# Patient Record
Sex: Female | Born: 1950 | Race: Black or African American | Hispanic: No | Marital: Single | Smoking: Former smoker
Health system: Southern US, Community
[De-identification: ages and names within clinical notes are randomized; demographics above are authoritative.]

## PROBLEM LIST (undated history)

## (undated) DIAGNOSIS — M1990 Unspecified osteoarthritis, unspecified site: Secondary | ICD-10-CM

## (undated) DIAGNOSIS — F32A Depression, unspecified: Secondary | ICD-10-CM

## (undated) DIAGNOSIS — M48061 Spinal stenosis, lumbar region without neurogenic claudication: Secondary | ICD-10-CM

## (undated) DIAGNOSIS — E785 Hyperlipidemia, unspecified: Secondary | ICD-10-CM

## (undated) DIAGNOSIS — H269 Unspecified cataract: Secondary | ICD-10-CM

## (undated) DIAGNOSIS — F419 Anxiety disorder, unspecified: Secondary | ICD-10-CM

## (undated) DIAGNOSIS — C801 Malignant (primary) neoplasm, unspecified: Secondary | ICD-10-CM

## (undated) DIAGNOSIS — I10 Essential (primary) hypertension: Secondary | ICD-10-CM

## (undated) DIAGNOSIS — F329 Major depressive disorder, single episode, unspecified: Secondary | ICD-10-CM

## (undated) DIAGNOSIS — M549 Dorsalgia, unspecified: Secondary | ICD-10-CM

## (undated) DIAGNOSIS — K219 Gastro-esophageal reflux disease without esophagitis: Secondary | ICD-10-CM

## (undated) HISTORY — PX: TRANSTHORACIC ECHOCARDIOGRAM: SHX275

## (undated) HISTORY — DX: Gastro-esophageal reflux disease without esophagitis: K21.9

## (undated) HISTORY — DX: Hyperlipidemia, unspecified: E78.5

## (undated) HISTORY — DX: Spinal stenosis, lumbar region without neurogenic claudication: M48.061

## (undated) HISTORY — PX: HAND SURGERY: SHX662

## (undated) HISTORY — DX: Major depressive disorder, single episode, unspecified: F32.9

## (undated) HISTORY — DX: Unspecified cataract: H26.9

## (undated) HISTORY — DX: Essential (primary) hypertension: I10

## (undated) HISTORY — DX: Dorsalgia, unspecified: M54.9

## (undated) HISTORY — PX: CHOLECYSTECTOMY: SHX55

## (undated) HISTORY — DX: Unspecified osteoarthritis, unspecified site: M19.90

## (undated) HISTORY — DX: Depression, unspecified: F32.A

---

## 1981-04-02 HISTORY — PX: VAGINAL HYSTERECTOMY: SUR661

## 2002-01-20 ENCOUNTER — Inpatient Hospital Stay (HOSPITAL_COMMUNITY): Admission: EM | Admit: 2002-01-20 | Discharge: 2002-01-22 | Payer: Self-pay | Admitting: Emergency Medicine

## 2002-01-20 ENCOUNTER — Encounter: Payer: Self-pay | Admitting: Emergency Medicine

## 2002-01-22 ENCOUNTER — Encounter: Payer: Self-pay | Admitting: Cardiology

## 2005-03-22 ENCOUNTER — Encounter: Admission: RE | Admit: 2005-03-22 | Discharge: 2005-03-22 | Payer: Self-pay | Admitting: Rheumatology

## 2005-10-04 ENCOUNTER — Encounter: Admission: RE | Admit: 2005-10-04 | Discharge: 2006-01-02 | Payer: Self-pay | Admitting: Internal Medicine

## 2006-11-14 ENCOUNTER — Encounter: Admission: RE | Admit: 2006-11-14 | Discharge: 2006-11-14 | Payer: Self-pay | Admitting: Family Medicine

## 2006-11-28 ENCOUNTER — Encounter: Admission: RE | Admit: 2006-11-28 | Discharge: 2006-11-28 | Payer: Self-pay | Admitting: Family Medicine

## 2006-11-30 ENCOUNTER — Encounter: Admission: RE | Admit: 2006-11-30 | Discharge: 2006-11-30 | Payer: Self-pay | Admitting: Family Medicine

## 2006-12-11 ENCOUNTER — Ambulatory Visit: Payer: Self-pay | Admitting: Internal Medicine

## 2006-12-11 LAB — CONVERTED CEMR LAB
Basophils Relative: 0.7 % (ref 0.0–1.0)
CO2: 28 meq/L (ref 19–32)
Calcium: 9.2 mg/dL (ref 8.4–10.5)
Chloride: 103 meq/L (ref 96–112)
Eosinophils Relative: 1.4 % (ref 0.0–5.0)
GFR calc Af Amer: 111 mL/min
Glucose, Bld: 86 mg/dL (ref 70–99)
HCT: 37.1 % (ref 36.0–46.0)
Lymphocytes Relative: 42.5 % (ref 12.0–46.0)
Neutro Abs: 3.1 10*3/uL (ref 1.4–7.7)
Neutrophils Relative %: 47.5 % (ref 43.0–77.0)
Platelets: 387 10*3/uL (ref 150–400)
Sodium: 139 meq/L (ref 135–145)
WBC: 6.5 10*3/uL (ref 4.5–10.5)

## 2007-01-15 ENCOUNTER — Ambulatory Visit: Payer: Self-pay | Admitting: Internal Medicine

## 2007-02-04 ENCOUNTER — Encounter: Admission: RE | Admit: 2007-02-04 | Discharge: 2007-02-04 | Payer: Self-pay | Admitting: Neurology

## 2007-02-17 ENCOUNTER — Encounter: Admission: RE | Admit: 2007-02-17 | Discharge: 2007-02-17 | Payer: Self-pay | Admitting: Neurology

## 2007-03-13 ENCOUNTER — Encounter: Admission: RE | Admit: 2007-03-13 | Discharge: 2007-03-13 | Payer: Self-pay | Admitting: Neurology

## 2008-06-25 ENCOUNTER — Encounter: Admission: RE | Admit: 2008-06-25 | Discharge: 2008-06-25 | Payer: Self-pay | Admitting: Family Medicine

## 2010-08-15 NOTE — Assessment & Plan Note (Signed)
HEALTHCARE                             PULMONARY OFFICE NOTE   JUN, RIGHTMYER                   MRN:          [id number]  DATE:01/15/2007                            DOB:          [birth year]    HISTORY:  A 60 year old black female, former smoker with new onset cough  3 months prior to her evaluation on September 10th with apparent  eventration of the right hemidiaphragm, and possible interstitial lung  disease associated with that I thought was symptoms suggestive of  GERD/LPR on a background of possible rheumatoid arthritis.   I initially approached the problem by recommending a strict diet for  GERD, and asking her to take omeprazole perfectly regularly, 40 mg  before breakfast.  She returns today all smiles with complete  elimination of cough.  She has no significant limiting dyspnea at this  point (she is limited by her back and hips though).   PHYSICAL EXAMINATION:  She is a pleasant ambulatory black female in no  acute distress.  She had stable vital signs.  HEENT:  Unremarkable.  Oropharynx clear.  LUNG FIELDS:  Reveal minimal crackles in the bases.  There is no cough  on inspiratory maneuvers.  HEART:  Regular rhythm without murmur, gallop, or rub.  No increase in  P2.  ABDOMEN:  Soft and benign.  EXTREMITIES:  Warm without calf tenderness, cyanosis, clubbing, or  edema.   Spirometry reveals borderline low lung volumes with a diffusing capacity  of 59% that corrects to 126.   LABORATORY DATA:  From September 10th indicated sed rate of 38.  No  significant eosinophils or evidence of elevated BNP.   IMPRESSION:  Multifactorial dyspnea that I believe is probably related  to very mild interstitial lung disease, and poorly functioning right  hemidiaphragm.  The best she can do in this setting is not gain weight  in the abdominal compartment.  Also, we need to make sure if she does in  fact have any form of rheumatoid arthritis,  that the disease is well  controlled systemically because of the possibility of rheumatoid lung  disease.  I have not actually confirmed she has rheumatoid arthritis  yet, much less rheumatoid lung disease, however.  Almost all of her  symptoms are resolved with proton pump inhibitor.  Acid reflux could be  a unifying diagnosis, as it could cause fibrosis as well, and therefore,  I have asked her to maintain the diet and continue the omeprazole  indefinitely.  Based on how dramatic her response was to omeprazole, I  would also consider eliminating Norvasc from her regimen, and  substituting ARB if possible, such as Diovan or Benicar since calcium  channel blockers interfere with gastroesophageal valve functioning, and  may result in non-acid reflux in a patient maintained on omeprazole.  I  will defer  this issue to Dr. Clide Deutscher, having confirmed that the patient has  eliminated all of her pulmonary symptoms on her present regimen, and  bring this up as a future concern only.     Charlaine Dalton. Sherene Sires, MD, [facility]  Electronically Signed  MBW/MedQ  DD: 01/15/2007  DT: 01/16/2007  Job #: [id number]   cc:   Clyda Greener, MD  Aundra Dubin, M.D.

## 2010-08-15 NOTE — Assessment & Plan Note (Signed)
[facility]                             PULMONARY OFFICE NOTE   Morgan Swanson, Morgan Swanson                   MRN:          [id number]  DATE:12/11/2006                            DOB:          28-Jan-1951    HISTORY:  A 60 year old black female, former smoker, with new onset  cough about 3 months ago with generalized discomfort during deep  breathing and dyspnea with exertion but not at rest.  She has had no  real progression of her symptoms but comes in today because of an  abnormal chest x-ray indicating a possible interstitial lung disease.  She has already been treated empirically for pneumonia and also with  prednisone since the onset of her symptoms and feels some better.  She  still feels uncomfortable when she tries to take a deep breath but only  over the center.  She denies any significant variability of her symptoms  with weather or environmental change or nocturnal wheeze, fevers,  chills, sweats, chest pain, or lateralizing pleuritic pain, orthopnea,  PND, or leg swelling.  No unintended weight loss or difficulty with  swallowing or sinus tenderness.   PAST MEDICAL HISTORY:  Significant for:  1. Hypertension.  2. She does have a history of rheumatology disease per Dr. Kellie Simmering,      does not have a specific diagnosis, however, and has not been on      any form of immunotherapy.  3. She is status post cholecystectomy.  4. Hysterectomy.   ALLERGIES:  1. PENICILLIN.  2. CODEINE.   MEDICATIONS:  Crestor, furosemide, amlodipine.   No history of any Macrodantin or amiodarone exposure.   SOCIAL HISTORY:  She quit smoking 7 years ago.  She works as a Financial risk analyst.   FAMILY HISTORY:  Negative for respiratory diseases or rheumatism.   REVIEW OF SYSTEMS:  Taken in detail on the worksheet negative except as  outlined above.   PHYSICAL EXAMINATION:  GENERAL:  A pleasant ambulatory black female in  no acute distress.  VITAL SIGNS:  She is afebrile  with normal vital signs.  HEENT:  Unremarkable.  Pharynx is clear.  Dentition is intact.  Nasal  turbinate normal.  Ear canals clear bilaterally.  NECK:  Supple without cervical adenopathy or tenderness.  Trachea is  midline.  No thyromegaly.  LUNGS:  Fields reveal minimal coarsening of breath sounds with no  typical crackles.  HEART:  Regular rhythm without murmur, gallop, or rub.  ABDOMEN:  Soft, benign.  EXTREMITIES:  Warm without calf tenderness, cyanosis, clubbing.   Chest x-ray shows slight elevation of right hemidiaphragm with mild  increased interstitial markings.   IMPRESSION:  Possible interstitial lung disease associated with probable  long-standing eventration of the right hemidiaphragm.  Some of her  symptoms sound suggestive of gastroesophageal reflux disease, which is  statistically associated with pulmonary fibrosis, although not  necessarily in this case by cause and effect.   I did recommend empirically starting her on omeprazole 40 mg 30 minutes  before supper because most of her symptoms occur worse in the evening  but would also recommend a BNP, CBC  with differential looking for  eosinophilia, sed rate today, and followup set of PFTs.   I discussed with her briefly the differential diagnosis of pulmonary  fibrosis, though I do not necessarily think she fits the typical pattern  of idiopathic pulmonary fibrosis or for that matter any progressive  pattern.  I am most intrigued by the fact that she has a history of  possible rheumatoid arthritis and that this may represent a pulmonary  manifestation and will also send a copy of my thoughts to Dr. Kellie Simmering.     Morgan Swanson. Morgan Sires, MD, [facility]  Electronically Signed    MBW/MedQ  DD: 12/11/2006  DT: 12/12/2006  Job #: [id number]   cc:   Clyda Greener, MD  Aundra Dubin, M.D.

## 2010-08-18 NOTE — Discharge Summary (Signed)
NAME:  Morgan Swanson, Morgan Swanson                      ACCOUNT NO.:  [id number]   MEDICAL RECORD NO.:  [id number]                   PATIENT TYPE:  INP   LOCATION:  3731                                 FACILITY:  [facility]   PHYSICIAN:  Madaline Guthrie, MD                   DATE OF BIRTH:  Jan 29, 1951   DATE OF ADMISSION:  01/20/2002  DATE OF DISCHARGE:  01/22/2002                                 DISCHARGE SUMMARY   DISCHARGE DIAGNOSES:  1. Chest pressure.  2. Systolic murmur.  3. Hypertension.  4. Tobacco abuse.  5. Anxiety.   DISCHARGE MEDICATIONS:  1. Lisinopril 5 mg one p.o. q.d.  2. Hydroxyzine 50 mg one to two p.o. q.6h. p.r.n. anxiety.   DISCHARGE INSTRUCTIONS:  She is to return to work on Monday. If she  continues to have any chest pain with nausea, diaphoresis, increased  shortness of breath, she is to call her primary M.D. or return to the  hospital. She is also to take the hydroxyzine for anxiety and also warned  her not to drive after she takes the hydroxyzine as it may make her tired.   FOLLOW UP:  Followup appointment has been made with Dr. Lonzo Cloud on  10 a.m. on Wednesday, 10/28. At this time, she will need to have a basic  metabolic panel drawn.   HISTORY OF PRESENT ILLNESS:  The patient is a 60 year old African-American  female who presented to the emergency department via EMS for chest pressure  that began the morning of admission while at work. She was ambulating at the  time. The pressure lasted less than 30 minutes and relieved from a 7/10 to a  4/10 with aspirin and then again to a 0/10 after nitroglycerin given by the  EMS. Pressure was located in the mid sternal area, left-sided, with  radiation to the lower back and left shoulder and neck. There were  palpitations associated with this chest pressure. She had no associated  nausea, vomiting, diaphoresis, and no syncope. She did have some shortness  of breath which was relieved by rest. She had prior episodes  of chest  pressures and palpitations for about one week that were occurring at  increased frequency and definitely with increased severity on day of  admission. She had no significant cardiac history. Her past medical history  was significant for hypertension, post menopausal, and heart murmur. She had  been noncompliant with her lisinopril and hydrochlorothiazide and had been  followed closely by Dr. Henreitta Leber at [facility]. Her cardiac risk factors  include being a female who is post menopausal, tobacco use, and  hypertension.   PHYSICAL EXAMINATION:  VITAL SIGNS:  On admission showed stable vital signs  with a blood pressure of 151/91, O2 saturation 98% on room air.  GENERAL:  She appeared to be alert and oriented x3 in no acute distress.  LUNGS:  Significant things on her physical exam include  her lung exam clear  to auscultation bilaterally without wheezing, rales, or rhonchi and no  increased work of breathing.  CARDIOVASCULAR:  She was regular, rate, and rhythm with a +2/6 systolic  murmur best heard at the right upper sternal border.  ABDOMEN:  Soft and nontender with good bowel sounds. No rebound, guarding,  or rigidity.  EXTREMITIES:  Showed no edema, clubbing, or cyanosis.  MUSCULOSKELETAL EXAM:  Showed a +5/5 grip strength.  NEUROLOGICAL:  She was grossly intact with no focal deficits.   LABORATORY DATA:  Initial laboratory work done on admission showed a normal  CBC, a normal BMP. Cardiac enzymes were obtained in three sets, all which  were negative. The patient did not require any more medication for chest  pain and, when seen in the emergency department, had a post nitroglycerin  headache.   HOSPITAL COURSE:  1. For her chest pressure and dyspnea, we ruled out any cardiac causes by     doing serial EKGs, showing normal sinus rhythm with no ectopy and no ST-     segment changes or T-wave changes. She was placed on the telemetry unit     and remained in normal sinus  rhythm. Her cardiac enzymes were negative     x3. She was placed on 81 mg of aspirin per day, nitroglycerin p.r.n.     which she did require any further doses. A chest x-ray was done in the ED     showing cardiomegaly. We ruled out any thyroid dysfunction with a TSH at     1.032. An ABG was done in the emergency room to rule out PE as source for     her chest pressure and dyspnea. Her blood gas revealed a pH of 7.374, a     pCO2 of 45.1, and a bicarb of 26.0. We ruled out other cardiac risk     factors with a fasting lipid panel. Her total cholesterol was 233, her     triglycerides were 90, HDL 85, and LDL was 130. An echocardiogram was     performed to rule out any LV dysfunction, and final results are still     pending.  2. For her systolic murmur which patient noted she had a history of in the     past; on physical exam, her 2/6 systolic murmur increased in loudness     with standing and Valsalva murmur and decreased when supine. The     echocardiogram was done to rule out any hypertrophic cardiomyopathy     associated with her systolic murmur. Results are pending.  3. For her hypertension, the patient was placed on Prinivil 5 mg q.d. Blood     pressure responded appropriately.  4. For her palpitations, the patient was placed on telemetry and had no     arrhythmias during her stay. She did note some anxiety and a lot of     stress in her life. At discharge, we are sending her home with some     hydroxyzine p.r.n. as an outpatient trial.   PENDING RESULTS:  Pending results include her echocardiogram results done on  day of discharge.   FOLLOWUP ITEMS:  Suggested followup items include a basic metabolic panel  which should be drawn at her followup appointment as she is placed back on  her ACE inhibitor. It was noted that the patient had stopped taking her ACE  inhibitor, and there is an unknown length of time in between when she stopped taking and  when she restarted it. Please check on  her potassium and  her creatinine. Also followup on her hydroxyzine as it has just been started  for her anxiety. She may need more long term anxiety medications such as a  SSRI. Also followup on her high blood pressure as she originally had been  started on two antihypertensives by Dr. Henreitta Leber, lisinopril and  hydrochlorothiazide. Currently, she is only on 5 mg of lisinopril. She may  in the future require additional antihypertensive medications.     Lorne Skeens, M.D.                         Madaline Guthrie, MD    KL/MEDQ  D:  01/22/2002  T:  01/23/2002  Job:  [id number]   cc:   Secundino Ginger, M.D.  [facility]

## 2010-10-02 IMAGING — CR DG CHEST 2V
2 series · 2 of 2 positions shown · non-contrast
Comparison: Chest x-ray of 12/11/2006

CLINICAL DATA: Hypertension, history of arthritis, former smoker

CHEST - 2 VIEW

[view not recorded (1 of 2)]
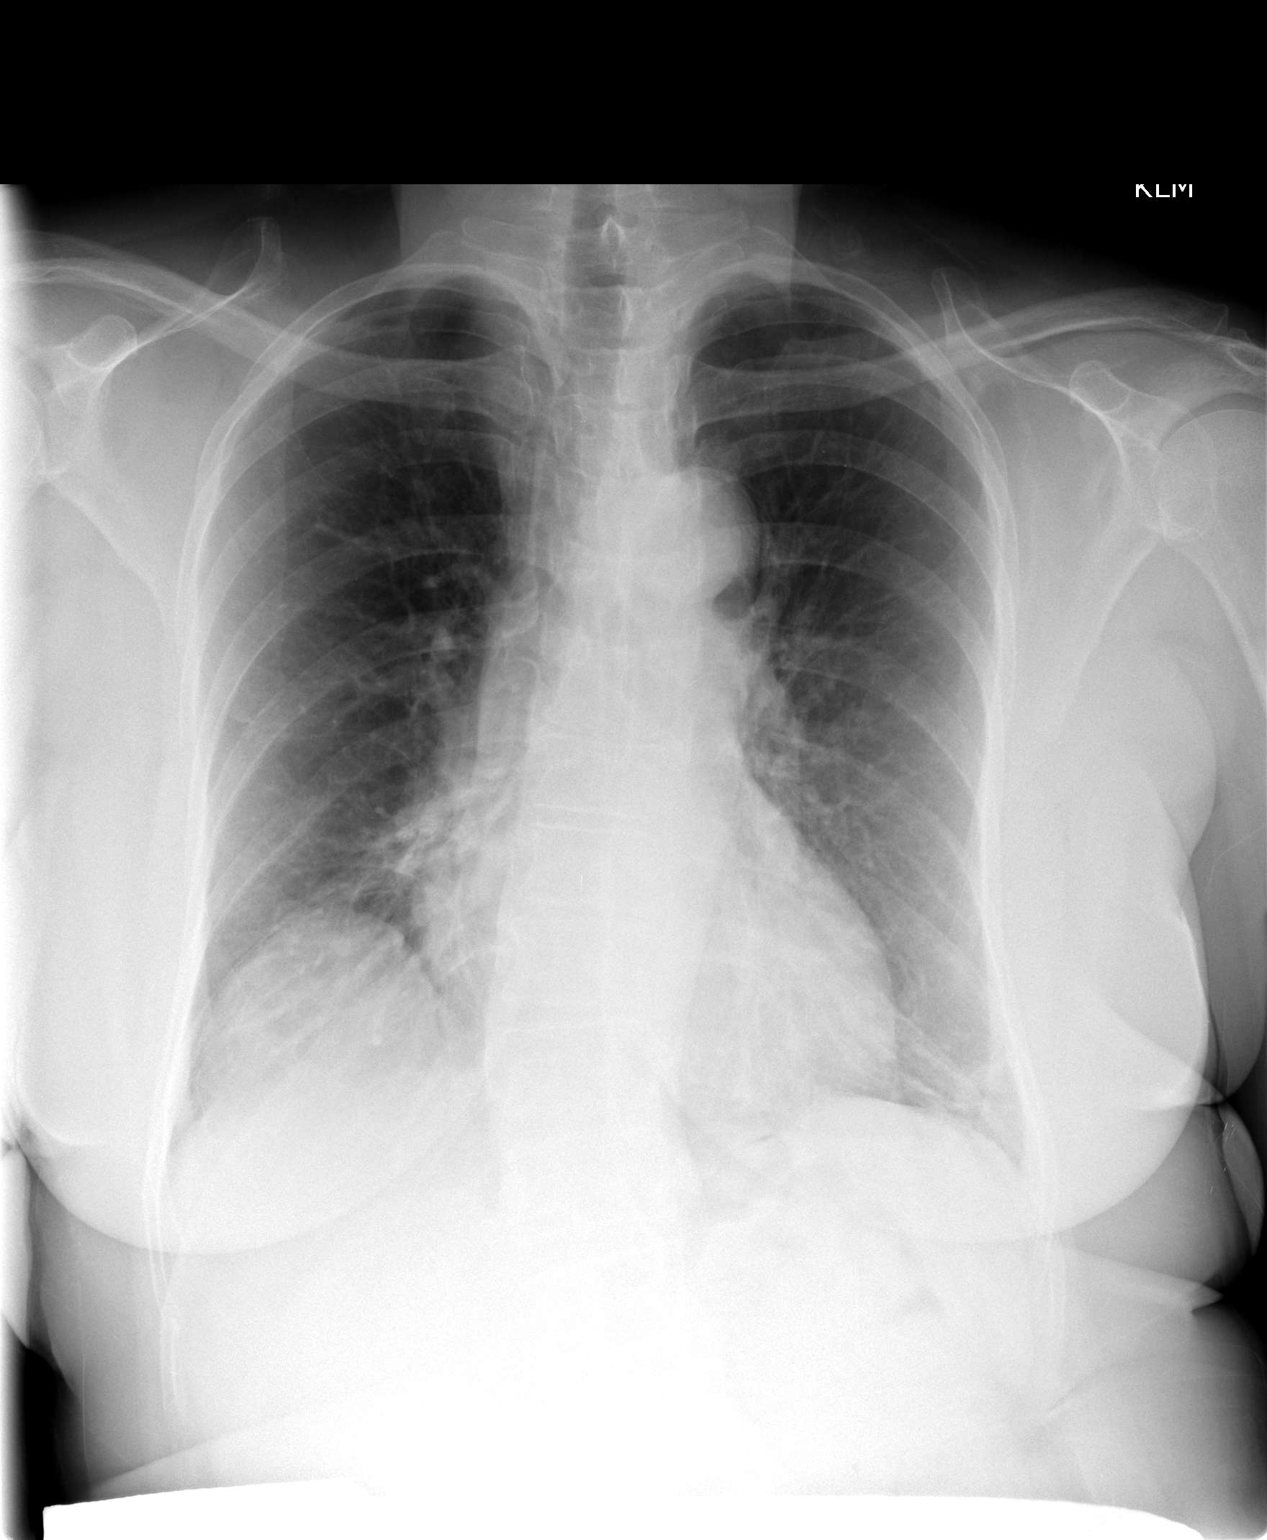

[view not recorded (2 of 2)]
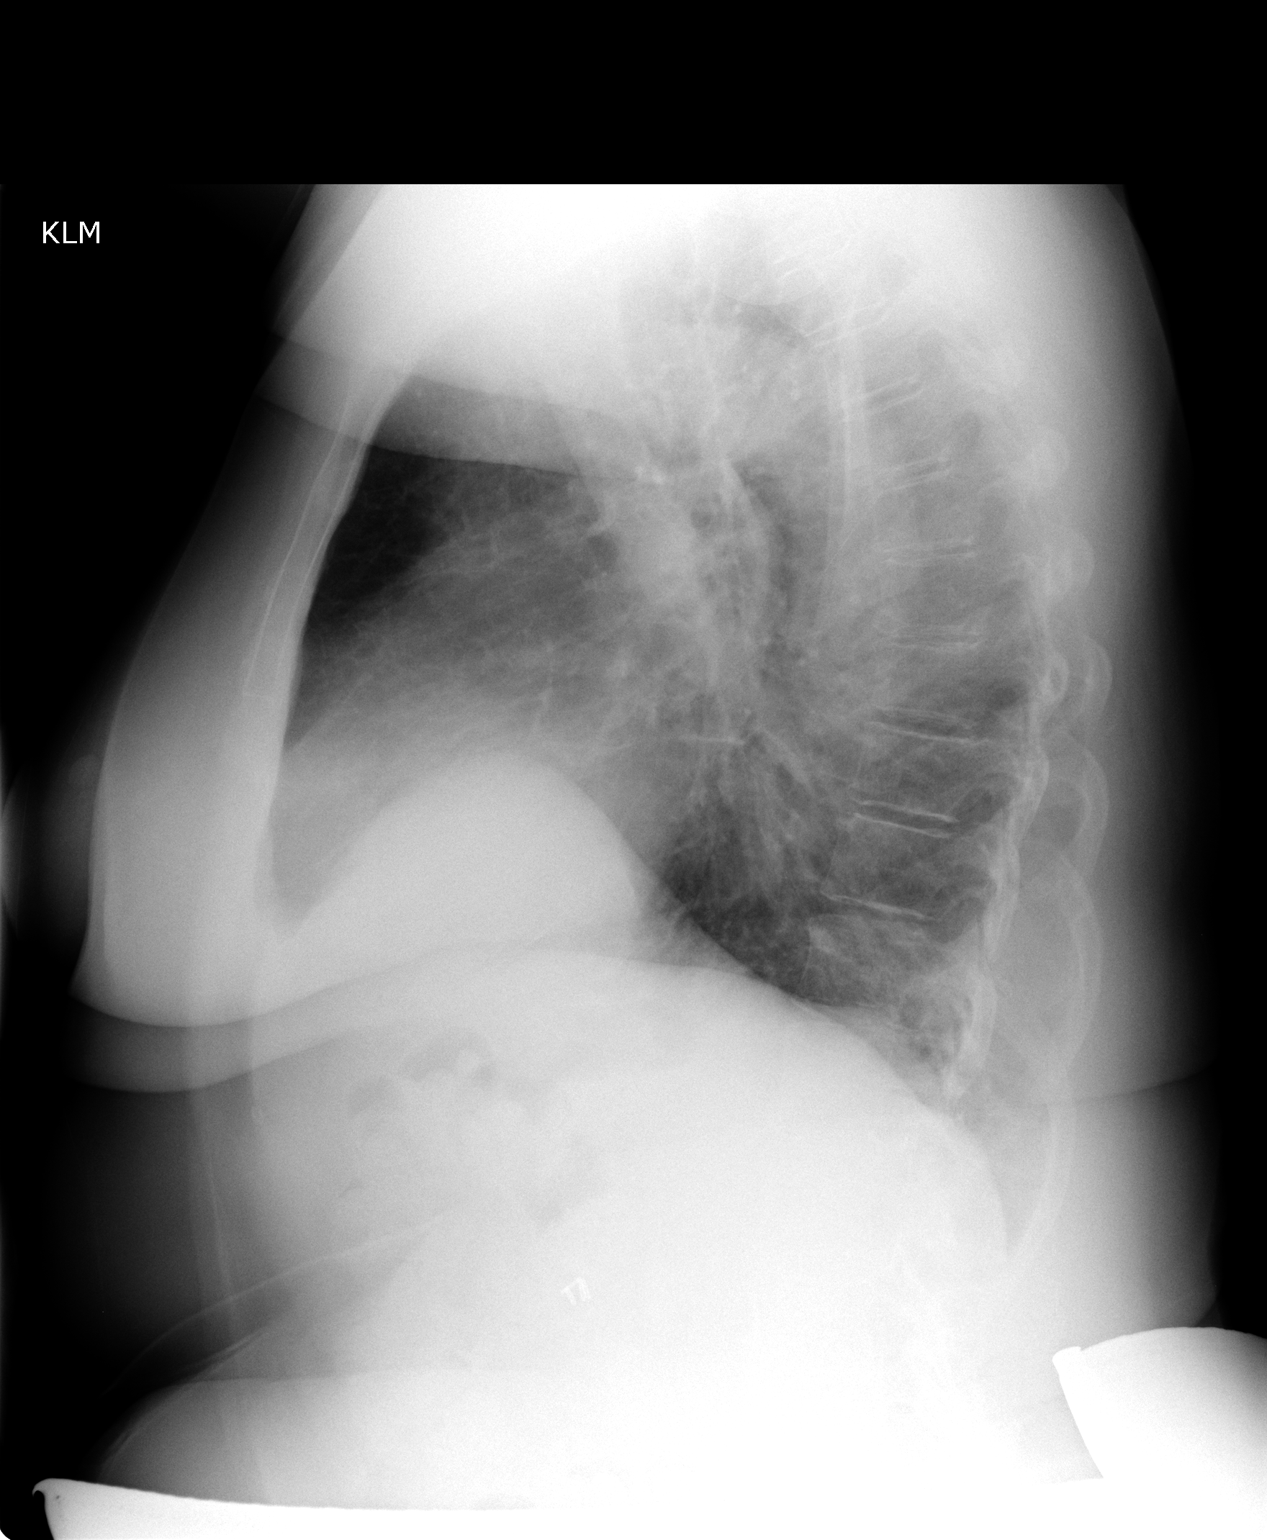

[2 of 2 positions shown; findings below may reference images not displayed]

FINDINGS: Minimal streaky opacity remains at the  left lung base
consistent with scarring or atelectasis.  No definite pneumonia is
seen.  The heart is within upper limits of normal. No bony
abnormality is seen.
IMPRESSION: No change in linear atelectasis or scarring at the left lung base.
No focal infiltrate or effusion.

## 2012-07-04 ENCOUNTER — Other Ambulatory Visit: Payer: Self-pay

## 2012-07-04 MED ORDER — FENTANYL 12 MCG/HR TD PT72: 1.0000 | MEDICATED_PATCH | TRANSDERMAL | Status: DC

## 2012-07-04 NOTE — Telephone Encounter (Signed)
Patient called clinic requesting refill on pain patch.  Former Dr Sandria Manly patient.  Forwarding request to Dr That Raquel Sarna this morning

## 2012-07-21 ENCOUNTER — Ambulatory Visit (INDEPENDENT_AMBULATORY_CARE_PROVIDER_SITE_OTHER): Payer: BC Managed Care – PPO | Admitting: Nurse Practitioner

## 2012-07-21 ENCOUNTER — Encounter: Payer: Self-pay | Admitting: Nurse Practitioner

## 2012-07-21 VITALS — BP 134/67 | HR 63 | Ht 62.0 in | Wt 149.0 lb

## 2012-07-21 DIAGNOSIS — G544 Lumbosacral root disorders, not elsewhere classified: Secondary | ICD-10-CM

## 2012-07-21 DIAGNOSIS — Z79899 Other long term (current) drug therapy: Secondary | ICD-10-CM

## 2012-07-21 DIAGNOSIS — M48061 Spinal stenosis, lumbar region without neurogenic claudication: Secondary | ICD-10-CM

## 2012-07-21 MED ORDER — GABAPENTIN 100 MG PO CAPS: 100.0000 mg | ORAL_CAPSULE | ORAL | Status: DC

## 2012-07-21 NOTE — Progress Notes (Signed)
HPI: Patient returns for followup after her last visit 02/11/2012. She has a history of low back pain for 9 years. Pain primarily radiates into her legs more on the right than the left. She occasionally has tingling in the toes. 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 She had epidurals in the past. Fentanyl patch now controls her pain. She continues to work full time, she is the guardian of 4 grandchildren. Pain is well controlled, she intermittently does her back exercises.She gets no regular exercise. No new neurologic complaints. She is a previous patient of Dr. Sandria Manly.    ROS:  : numbness, weakness which has not changed  Physical Exam General: well developed, well nourished, seated, in no evident distress Head: head normocephalic and atraumatic. Oropharynx benign Neck: supple with no carotid or supraclavicular bruits Cardiovascular: regular rate and rhythm, no murmurs  Neurologic Exam Mental Status: Awake and fully alert. Oriented to place and time. Recent and remote memory intact. Attention span, concentration and fund of knowledge appropriate. Mood and affect appropriate.  Cranial Nerves: Pupils equal, briskly reactive to light. Extraocular movements full without nystagmus. Visual fields full to confrontation. Hearing intact and symmetric to finger snap. Facial sensation intact. Face, tongue, palate move normally and symmetrically. Neck flexion and extension normal.  Motor: Normal bulk and tone. Normal strength in all tested extremity muscles.Straight leg raising on the right with some pain. Forward bending and twisting to the right or left without pain.  Sensory.: intact to touch and pinprick and vibratory.  Coordination: Rapid alternating movements normal in all extremities. Finger-to-nose and heel-to-shin performed accurately bilaterally. Gait and Station: Arises from chair without difficulty. Stance is stooped.   Able to heel, toe and tandem walk without difficulty. Minimal limp on the right today. No assistive device.  Reflexes: 1+ and symmetric. Toes downgoing.     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 Fentanyl patch controls pain. Patient is also on Gabapentin     PLAN: Patient to continue Fentanyl patch for pain.  Will get drug screen today per narcotic aggreement.  Continue Gabapentin will renew rx.  FU in 3 months  Encouraged back exercises and walking for exercise.   To be assigned to Dr. Frances Furbish.   Nilda Riggs, GNP-BC APRN

## 2012-07-21 NOTE — Patient Instructions (Addendum)
Patient to continue Fentanyl patch for pain.  Will get drug screen today. Continue Gabapentin will renew rx.  Perform back exercises, walk for exercise.  FU in 3 months .  To be assigned to Dr. Frances Furbish.

## 2012-07-23 LAB — 733690 12+OXYCODONE+CRT-SCR
Amphetamine Screen, Ur: NEGATIVE ng/mL
Barbiturate Screen, Ur: NEGATIVE ng/mL
Cannabinoids Ur Ql Scn: NEGATIVE ng/mL
Cocaine(Metab.)Screen, Urine: NEGATIVE ng/mL
Fentanyl+Norfentanyl Ur Ql Scn: POSITIVE pg/mL
Ph of Urine: 6.6 (ref 4.5–8.9)
Tramadol Ur Ql Scn: NEGATIVE ng/mL

## 2012-08-05 ENCOUNTER — Other Ambulatory Visit: Payer: Self-pay | Admitting: *Deleted

## 2012-08-05 NOTE — Telephone Encounter (Signed)
Per OV on 07/21/2012, patient will be assigned to Dr Frances Furbish.

## 2012-08-05 NOTE — Telephone Encounter (Signed)
Patient needs a refill on her fentanyl patch.

## 2012-08-06 MED ORDER — FENTANYL 12 MCG/HR TD PT72: 1.0000 | MEDICATED_PATCH | TRANSDERMAL | Status: DC

## 2012-08-06 NOTE — Telephone Encounter (Signed)
Sandy: I will approve the Rx for Fentanyl, please assign patient to next available physician on the rotation for FU down the road.

## 2012-09-04 ENCOUNTER — Other Ambulatory Visit: Payer: Self-pay

## 2012-09-04 MED ORDER — FENTANYL 12 MCG/HR TD PT72: 1.0000 | MEDICATED_PATCH | TRANSDERMAL | Status: DC

## 2012-09-04 NOTE — Telephone Encounter (Signed)
Callled patient left her a message will try and reach her again to sch. Patient with Dr.Yan in July.  Per RN Lupita Leash .

## 2012-09-04 NOTE — Telephone Encounter (Signed)
Former [facility] patient called to request refill on Fentanyl.  She has been assigned to Dr Terrace Arabia per Triage.

## 2012-09-05 ENCOUNTER — Telehealth: Payer: Self-pay | Admitting: Neurology

## 2012-09-05 NOTE — Telephone Encounter (Signed)
Rx is pending signature.  Called patient, got no answer,  Left message.

## 2012-09-05 NOTE — Telephone Encounter (Signed)
Patient states she's calling back to see if her prescription is ready for pick up.  I looked up front, did not see it.  She would like to pick it up today if at all possible.  She can be reached at [phone]

## 2012-09-08 NOTE — Telephone Encounter (Signed)
Called patient back asking her to call me for apt.

## 2012-09-08 NOTE — Telephone Encounter (Signed)
Called and spoke to patient and she is with Dr.Yan 10/24/2012 . Put her Fentanyl patch at front desk for pick up. Explained to patient she needed to keep her July apt. Patient understood.

## 2012-09-30 ENCOUNTER — Other Ambulatory Visit: Payer: Self-pay | Admitting: Cardiology

## 2012-09-30 NOTE — Telephone Encounter (Signed)
Per last OV on 04/21, patient has been assigned to Dr Frances Furbish.  She has a narcotic agreement on file per CM.

## 2012-10-01 MED ORDER — FENTANYL 12 MCG/HR TD PT72: 1.0000 | MEDICATED_PATCH | TRANSDERMAL | Status: DC

## 2012-10-01 NOTE — Telephone Encounter (Signed)
I will sign her Rx, but please advise pt that I do not treat chronic pain and she may need a referral to pain management going into the future

## 2012-10-22 ENCOUNTER — Ambulatory Visit: Payer: BC Managed Care – PPO | Admitting: Neurology

## 2012-10-24 ENCOUNTER — Encounter: Payer: Self-pay | Admitting: Neurology

## 2012-10-24 ENCOUNTER — Ambulatory Visit (INDEPENDENT_AMBULATORY_CARE_PROVIDER_SITE_OTHER): Payer: BC Managed Care – PPO | Admitting: Neurology

## 2012-10-24 VITALS — BP 167/82 | HR 63 | Ht 63.0 in | Wt 148.0 lb

## 2012-10-24 DIAGNOSIS — Z79899 Other long term (current) drug therapy: Secondary | ICD-10-CM

## 2012-10-24 DIAGNOSIS — M48061 Spinal stenosis, lumbar region without neurogenic claudication: Secondary | ICD-10-CM

## 2012-10-24 DIAGNOSIS — G544 Lumbosacral root disorders, not elsewhere classified: Secondary | ICD-10-CM

## 2012-10-24 NOTE — Progress Notes (Signed)
[facility]  PATIENT: Morgan Swanson DOB: [birth year]  HISTORICAL  Morgan Swanson is a 62 years old right-handed African American female, came in to followup for her low back pain, lumbar stenosis,  She had a past medical history of hypertension, hyperlipidemia, had a history of lumbar stenosis.   She presented to his low back pain since 2008, her low back pain radiating to her right lower extremity, involving right plantar foot, she denies significant weakness, no gait difficulty, she has been treated with fentanyl patch55mcg/q72 hours, which has been very helpful, she only has minimal discomfort. She denies bowel or bladder incontinence.  Most recent MRI lumbar was in 11/2006, which showed grade 1 anterolisthesis L4-5 with moderate to severe facet hypertrophy and moderate to severe central canal stenosis, moderate to severe right foraminal narrowing, right greater than left at L4-5.    REVIEW OF SYSTEMS: Full 14 system review of systems performed and notable only for low back pain   ALLERGIES: Allergies  Allergen Reactions  . Codeine   . Penicillins     HOME MEDICATIONS: Outpatient Prescriptions Prior to Visit  Medication Sig Dispense Refill  . amLODipine (NORVASC) 10 MG tablet Take 1 tablet by mouth daily.      . CRESTOR 20 MG tablet Take 1 tablet by mouth daily.      . fentaNYL (DURAGESIC) 12 MCG/HR Place 1 patch (12.5 mcg total) onto the skin every 3 (three) days.  10 patch  0  . furosemide (LASIX) 20 MG tablet Take 1 tablet by mouth daily.      Marland Kitchen gabapentin (NEURONTIN) 100 MG capsule Take 1 capsule (100 mg total) by mouth as directed. 1 capsule for first 2 doses  then 3 at hs  150 capsule  6  . meloxicam (MOBIC) 7.5 MG tablet Take 1 tablet by mouth 2 (two) times daily.      Marland Kitchen omeprazole (PRILOSEC) 40 MG capsule Take 1 capsule by mouth daily.       No facility-administered medications prior to visit.    PAST MEDICAL HISTORY: Past Medical History    Diagnosis Date  . Hypertension   . Back pain   . Depression   . Spinal stenosis, lumbar region, without neurogenic claudication     PAST SURGICAL HISTORY: Past Surgical History  Procedure Laterality Date  . Vaginal hysterectomy  1983    FAMILY HISTORY: Family History  Problem Relation Age of Onset  . Breast cancer Mother   . Diabetes Mother   . Fibromyalgia Mother   . Lung cancer Father   . Stroke Brother     SOCIAL HISTORY:  History   Social History  . Marital Status: Single    Spouse Name: N/A    Number of Children: 2  . Years of Education: HS   Occupational History  .  [employer]   Social History Main Topics  . Smoking status: Former Smoker    Quit date: 04/03/1999  . Smokeless tobacco: Never Used  . Alcohol Use: 0.6 oz/week    1 Glasses of wine per week     Comment: oc Holidays  . Drug Use: No  . Sexually Active: Not on file   Other Topics Concern  . Not on file   Social History Narrative   Pt lives at home with her four grandchildren. Patient is single.   Caffeine Use- 2 cups of tea daily.   Right handed.     PHYSICAL EXAM  Filed Vitals:  10/24/12 1051  BP: 167/82  Pulse: 63  Height: 5\' 3"  (1.6 m)  Weight: 148 lb (67.132 kg)     Body mass index is 26.22 kg/(m^2).   Generalized: In no acute distress  Neck: Supple, no carotid bruits   Cardiac: Regular rate rhythm  Pulmonary: Clear to auscultation bilaterally  Musculoskeletal: No deformity  Neurological examination  Mentation: Alert oriented to time, place, history taking, and causual conversation  Cranial nerve II-XII: Pupils were equal round reactive to light extraocular movements were full, visual field were full on confrontational test. facial sensation and strength were normal. hearing was intact to finger rubbing bilaterally. Uvula tongue midline.  head turning and shoulder shrug and were normal and symmetric.Tongue protrusion into cheek strength was  normal.  Motor: normal tone, bulk and strength.  Sensory: Intact to fine touch, pinprick, preserved vibratory sensation, and proprioception at toes.  Coordination: Normal finger to nose, heel-to-shin bilaterally there was no truncal ataxia  Gait: Rising up from seated position without assistance, normal stance, without trunk ataxia, moderate stride, good arm swing, smooth turning, able to perform tiptoe, and heel walking without difficulty.   Romberg signs: Negative  Deep tendon reflexes: Brachioradialis 2/2, biceps 2/2, triceps 2/2, patellar 2/2, Achilles trace, plantar responses were flexor bilaterally.   DIAGNOSTIC DATA (LABS, IMAGING, TESTING) - I reviewed patient records, labs, notes, testing and imaging myself where available.  Lab Results  Component Value Date   WBC 6.5 12/11/2006   HGB 12.8 12/11/2006   HCT 37.1 12/11/2006   MCV 89.6 12/11/2006   PLT 387 12/11/2006      Component Value Date/Time   NA 139 12/11/2006 1553   K 3.6 12/11/2006 1553   CL 103 12/11/2006 1553   CO2 28 12/11/2006 1553   GLUCOSE 86 12/11/2006 1553   BUN 13 12/11/2006 1553   CREATININE 0.7 12/11/2006 1553   CALCIUM 9.2 12/11/2006 1553   GFRNONAA 91 12/11/2006 1553   GFRAA 111 12/11/2006 1553   ASSESSMENT AND PLAN 62 yo female with history of low back pain, radiating pain to her right lower extremity, MRI has demonstrated moderate to severe facet hypertrophy, moderate to severe central canal stenosis, right greater than left lateral recess stenosis.  She is doing very well with fentanyl patch  1.  continue fentanyl patch as needed 2.  return to clinic in  one year   Levert Feinstein, M.D. Ph.D.  [facility] [address], [address] [place], [place] [address] [phone]

## 2012-11-03 ENCOUNTER — Other Ambulatory Visit: Payer: Self-pay

## 2012-11-03 MED ORDER — FENTANYL 12 MCG/HR TD PT72: 1.0000 | MEDICATED_PATCH | TRANSDERMAL | Status: DC

## 2012-11-03 NOTE — Telephone Encounter (Signed)
Patient is requesting a refill on Fentanyl Patch.  Dr Terrace Arabia is out of the office, forwarding request to Dr Marjory Lies, [facility]

## 2012-11-03 NOTE — Telephone Encounter (Signed)
Message copied by Malachy Moan on Mon Nov 03, 2012 10:47 AM ------      Message from: Warren Lacy A      Created: Mon Nov 03, 2012 10:31 AM       She needs refill on Fentanyl patch. Please call when ready ------

## 2012-11-03 NOTE — Telephone Encounter (Signed)
Rx signed, ready for pick up.  I called the patient.  She is aware.  

## 2012-12-02 ENCOUNTER — Other Ambulatory Visit: Payer: Self-pay | Admitting: Neurology

## 2012-12-02 MED ORDER — FENTANYL 12 MCG/HR TD PT72: 1.0000 | MEDICATED_PATCH | TRANSDERMAL | Status: DC

## 2012-12-04 NOTE — Telephone Encounter (Signed)
Rx signed, ready for pick up.  I called the patient, got no answer,  Left message.

## 2012-12-31 ENCOUNTER — Other Ambulatory Visit: Payer: Self-pay

## 2012-12-31 MED ORDER — FENTANYL 12 MCG/HR TD PT72: 1.0000 | MEDICATED_PATCH | TRANSDERMAL | Status: DC

## 2012-12-31 NOTE — Telephone Encounter (Signed)
Patient called requesting a refill on Fentanyl.  She would like to pick up the Rx when it's ready.

## 2013-01-06 ENCOUNTER — Telehealth: Payer: Self-pay | Admitting: *Deleted

## 2013-01-30 ENCOUNTER — Other Ambulatory Visit: Payer: Self-pay

## 2013-01-30 MED ORDER — FENTANYL 12 MCG/HR TD PT72: 1.0000 | MEDICATED_PATCH | TRANSDERMAL | Status: DC

## 2013-01-30 NOTE — Telephone Encounter (Signed)
Patient called requesting a refill on Fentanyl.  She would like to pick up the Rx when it's ready.  Call back number [phone].

## 2013-01-30 NOTE — Telephone Encounter (Signed)
I called patient and left VM that Rx is ready to be picked up

## 2013-03-02 ENCOUNTER — Other Ambulatory Visit: Payer: Self-pay | Admitting: Neurology

## 2013-03-03 MED ORDER — FENTANYL 12 MCG/HR TD PT72: 12.5000 ug | MEDICATED_PATCH | TRANSDERMAL | Status: DC

## 2013-03-03 NOTE — Telephone Encounter (Signed)
Please write Rx and route, thx

## 2013-03-04 NOTE — Telephone Encounter (Signed)
Called patient and left message that her prescription was ready to be picked up and if she has any other problems, questions or concerns to call the office. °

## 2013-03-31 ENCOUNTER — Other Ambulatory Visit: Payer: Self-pay | Admitting: Neurology

## 2013-03-31 MED ORDER — FENTANYL 12 MCG/HR TD PT72: 12.5000 ug | MEDICATED_PATCH | TRANSDERMAL | Status: DC

## 2013-03-31 NOTE — Telephone Encounter (Signed)
Patient requesting script of fentanyl patch.

## 2013-04-29 ENCOUNTER — Other Ambulatory Visit: Payer: Self-pay | Admitting: Neurology

## 2013-04-29 NOTE — Telephone Encounter (Signed)
Pt called in stating she needed her Fentanyl 12 mcg/hr refilled. Stated that this requires a written prescription.  Please call her when it's ready to pick up.  Thank you

## 2013-04-30 MED ORDER — FENTANYL 12 MCG/HR TD PT72: 12.5000 ug | MEDICATED_PATCH | TRANSDERMAL | Status: DC

## 2013-05-04 ENCOUNTER — Telehealth: Payer: Self-pay | Admitting: Neurology

## 2013-05-04 NOTE — Telephone Encounter (Signed)
This is a duplicate message,  Rx was already processed, however, it is pending MD signature.  We will call her when the Rx is ready for pick up. I called the patient back, got no answer.  Left message.

## 2013-05-04 NOTE — Telephone Encounter (Signed)
NEEDS WRITTEN RX FOR FENTANYL

## 2013-05-06 ENCOUNTER — Other Ambulatory Visit: Payer: Self-pay

## 2013-05-06 ENCOUNTER — Telehealth: Payer: Self-pay | Admitting: Neurology

## 2013-05-06 MED ORDER — FENTANYL 12 MCG/HR TD PT72: 12.5000 ug | MEDICATED_PATCH | TRANSDERMAL | Status: DC

## 2013-05-06 NOTE — Telephone Encounter (Signed)
Pt is calling back regarding her Fentanyl.  She has never heard back from anyone that it was ready to be picked up.  The last she heard was it was with the doctor to get her signature.  She is now completely out of her medication.  Please call

## 2013-05-06 NOTE — Telephone Encounter (Signed)
Office was not able to locate Rx, it may not have printed.  Sending new Request to MD for approval.

## 2013-05-06 NOTE — Telephone Encounter (Signed)
I spoke with the patient.  She is aware we will call when Rx is ready.  They were not able to locate it at the office, so a request has been sent to the provider to redo the Rx, as it may not have printed originally.

## 2013-05-07 NOTE — Telephone Encounter (Signed)
Called patient and left message informing her that her Rx was ready to be picked up and if she has any other problems, questions or concerns to call the office. °

## 2013-05-29 ENCOUNTER — Other Ambulatory Visit: Payer: Self-pay | Admitting: Neurology

## 2013-05-29 MED ORDER — FENTANYL 12 MCG/HR TD PT72: 12.5000 ug | MEDICATED_PATCH | TRANSDERMAL | Status: DC

## 2013-05-29 NOTE — Telephone Encounter (Signed)
Patient calling to request Fentanyl patch refill.

## 2013-06-01 NOTE — Telephone Encounter (Signed)
Called patient to inform her that her Rx was ready to be picked up at the front desk and if she has any other problems, questions or concerns to call the office. Patient verbalized understanding. °

## 2013-07-02 ENCOUNTER — Other Ambulatory Visit: Payer: Self-pay | Admitting: Neurology

## 2013-07-02 MED ORDER — FENTANYL 12 MCG/HR TD PT72: 12.5000 ug | MEDICATED_PATCH | TRANSDERMAL | Status: DC

## 2013-07-02 NOTE — Telephone Encounter (Signed)
Called pt to inform her that her Rx was ready to be picked up at the front desk and if she has any other problems, questions or concerns to call the office. Pt verbalized understanding. °

## 2013-07-02 NOTE — Telephone Encounter (Signed)
Dr Yan is out of the office, forwarding request to [facility]  

## 2013-07-02 NOTE — Telephone Encounter (Signed)
Pt called needs refill on her written prescription for fentaNYL (DURAGESIC) 12 MCG/HR. Please call pt when ready for pick up. Thanks

## 2013-08-03 ENCOUNTER — Other Ambulatory Visit: Payer: Self-pay | Admitting: Neurology

## 2013-08-03 MED ORDER — FENTANYL 12 MCG/HR TD PT72: 12.5000 ug | MEDICATED_PATCH | TRANSDERMAL | Status: DC

## 2013-08-03 NOTE — Telephone Encounter (Signed)
[facility]please call patient when ready for pickup--thank you.

## 2013-08-03 NOTE — Telephone Encounter (Signed)
Request forwarded to provider for approval  

## 2013-08-28 ENCOUNTER — Other Ambulatory Visit: Payer: Self-pay | Admitting: Neurology

## 2013-08-28 MED ORDER — FENTANYL 12 MCG/HR TD PT72: 12.5000 ug | MEDICATED_PATCH | TRANSDERMAL | Status: DC

## 2013-08-28 NOTE — Telephone Encounter (Signed)
Patient needs written Rx for Fentanyl patch--please call patient when ready for pick up--thank you.

## 2013-08-28 NOTE — Telephone Encounter (Signed)
Request forwarded to provider for approval  

## 2013-09-01 ENCOUNTER — Telehealth: Payer: Self-pay | Admitting: *Deleted

## 2013-09-01 NOTE — Telephone Encounter (Signed)
Rx is pending MD signature on hard copy.  I called back, got no answer.  Left message.  (We will call her when Rx is ready for pick up)

## 2013-09-07 NOTE — Telephone Encounter (Signed)
Called pt and left message informing her that her Rx was ready to be picked up at the front desk and if she has any other problems, questions or concerns to call the office.  °

## 2013-09-30 ENCOUNTER — Other Ambulatory Visit: Payer: Self-pay | Admitting: Neurology

## 2013-09-30 MED ORDER — FENTANYL 12 MCG/HR TD PT72: 12.5000 ug | MEDICATED_PATCH | TRANSDERMAL | Status: DC

## 2013-09-30 NOTE — Telephone Encounter (Signed)
Patient requesting refill of Fentanyl patch to pick up.

## 2013-09-30 NOTE — Telephone Encounter (Signed)
Request forwarded to provider for approval  

## 2013-10-06 ENCOUNTER — Other Ambulatory Visit: Payer: Self-pay | Admitting: Neurology

## 2013-10-06 MED ORDER — FENTANYL 12 MCG/HR TD PT72: 12.5000 ug | MEDICATED_PATCH | TRANSDERMAL | Status: DC

## 2013-10-06 NOTE — Telephone Encounter (Signed)
Pt called looking for her fentaNYL (DURAGESIC) 12 MCG/HR RX. Not up front in the accordion. Advised her someone would call her back.

## 2013-10-08 ENCOUNTER — Telehealth: Payer: Self-pay | Admitting: Neurology

## 2013-10-08 NOTE — Telephone Encounter (Signed)
Patient is still waiting on her Fentanyl patch refill, states that she is completely out and wants to pick it up tomorrow so she can get it filled before the weekend (her pharmacy is closed on the weekends). Please return call to patient and advise.

## 2013-10-09 NOTE — Telephone Encounter (Signed)
Pt's Rx was left at the front desk for pt to pick up.

## 2013-10-18 ENCOUNTER — Other Ambulatory Visit: Payer: Self-pay | Admitting: Nurse Practitioner

## 2013-10-18 NOTE — Telephone Encounter (Signed)
Has an appt scheduled in Oct

## 2013-10-19 ENCOUNTER — Telehealth: Payer: Self-pay | Admitting: *Deleted

## 2013-10-19 NOTE — Telephone Encounter (Signed)
Spoke with patient to r/s appointment on 10/26/13, patient was already r/s to 01/25/14 at 11:30 am, offered patient the 1:30 slot for 10/20/13 at 1:30 with NP CM, patient accepted appointment time changed.

## 2013-10-20 ENCOUNTER — Encounter: Payer: Self-pay | Admitting: Nurse Practitioner

## 2013-10-20 ENCOUNTER — Ambulatory Visit (INDEPENDENT_AMBULATORY_CARE_PROVIDER_SITE_OTHER): Payer: BC Managed Care – PPO | Admitting: Nurse Practitioner

## 2013-10-20 VITALS — BP 168/88 | HR 64 | Ht 63.0 in | Wt 153.0 lb

## 2013-10-20 DIAGNOSIS — G544 Lumbosacral root disorders, not elsewhere classified: Secondary | ICD-10-CM

## 2013-10-20 DIAGNOSIS — M48061 Spinal stenosis, lumbar region without neurogenic claudication: Secondary | ICD-10-CM

## 2013-10-20 DIAGNOSIS — Z79899 Other long term (current) drug therapy: Secondary | ICD-10-CM

## 2013-10-20 MED ORDER — GABAPENTIN 100 MG PO CAPS: 300.0000 mg | ORAL_CAPSULE | Freq: Every day | ORAL | Status: DC

## 2013-10-20 NOTE — Patient Instructions (Signed)
Patient to continue Fentanyl patch for pain.  Will get drug screen today per narcotic aggreement.  Continue Gabapentin will renew rx.  FU in 3 months

## 2013-10-20 NOTE — Progress Notes (Signed)
[facility]  PATIENT: Morgan Swanson DOB: 05/30/1950   REASON FOR VISIT: Followup lumbar spinal stenosis  HISTORY OF PRESENT ILLNESS:Morgan Swanson is a 63 years old right-handed African American female, to followup for her low back pain, lumbar stenosis,  She had a past medical history of hypertension, hyperlipidemia, had a history of lumbar stenosis. She was last seen in this office Dr. Terrace ArabiaYan 10/24/2012.  She presented for  low back pain since 2008, her low back pain radiating to her right lower extremity, involving right plantar foot, she denies significant weakness, no gait difficulty, she has been treated with fentanyl patch4912mcg/q72 hours, which has been very helpful, she only has minimal discomfort. She denies bowel or bladder incontinence. Most recent MRI lumbar was in 11/2006, which showed grade 1 anterolisthesis L4-5 with moderate to severe facet hypertrophy and moderate to severe central canal stenosis, moderate to severe right foraminal narrowing, right greater than left at L4-5. Patient has a signed  narcotic agreement with this practice. She has not been doing her exercises, HEP. She has not been taking her gabapentin. She returns for reevaluation.   REVIEW OF SYSTEMS: Full 14 system review of systems performed and notable only for those listed, all others are neg:  Constitutional: N/A  Cardiovascular: N/A  Ear/Nose/Throat: N/A  Skin: N/A  Eyes: N/A  Respiratory: N/A  Gastroitestinal: N/A  Hematology/Lymphatic: N/A  Endocrine: N/A Musculoskeletal:N/A  Allergy/Immunology: N/A  Neurological: Dizziness, numbness Psychiatric: N/A Sleep : NA   ALLERGIES: Allergies  Allergen Reactions  . Codeine   . Penicillins     HOME MEDICATIONS: Outpatient Prescriptions Prior to Visit  Medication Sig Dispense Refill  . amLODipine (NORVASC) 10 MG tablet Take 1 tablet by mouth daily.      . CRESTOR 20 MG tablet Take 1 tablet by mouth daily.      . fentaNYL  (DURAGESIC) 12 MCG/HR Place 1 patch (12.5 mcg total) onto the skin every 3 (three) days.  10 patch  0  . furosemide (LASIX) 20 MG tablet Take 1 tablet by mouth daily.      Marland Kitchen. gabapentin (NEURONTIN) 100 MG capsule take 1 capsule by mouth FOR THE FIRST 2 DOSES THEN 3 CAPS AT BEDTIME  150 capsule  2  . meloxicam (MOBIC) 7.5 MG tablet Take 1 tablet by mouth 2 (two) times daily.      Marland Kitchen. omeprazole (PRILOSEC) 40 MG capsule Take 1 capsule by mouth daily.       No facility-administered medications prior to visit.    PAST MEDICAL HISTORY: Past Medical History  Diagnosis Date  . Hypertension   . Back pain   . Depression   . Spinal stenosis, lumbar region, without neurogenic claudication     PAST SURGICAL HISTORY: Past Surgical History  Procedure Laterality Date  . Vaginal hysterectomy  1983    FAMILY HISTORY: Family History  Problem Relation Age of Onset  . Breast cancer Mother   . Diabetes Mother   . Fibromyalgia Mother   . Lung cancer Father   . Stroke Brother     SOCIAL HISTORY: History   Social History  . Marital Status: Single    Spouse Name: N/A    Number of Children: 2  . Years of Education: HS   Occupational History  .  [employer]   Social History Main Topics  . Smoking status: Former Smoker    Quit date: 04/03/1999  . Smokeless tobacco: Never Used  . Alcohol Use: 0.6 oz/week  1 Glasses of wine per week     Comment: oc Holidays  . Drug Use: No  . Sexual Activity: Not on file   Other Topics Concern  . Not on file   Social History Narrative   Pt lives at home with her four grandchildren. Patient is single.   Caffeine Use- 2 cups of tea daily.   Right handed.     PHYSICAL EXAM  Filed Vitals:   10/20/13 1304  BP: 168/88  Pulse: 64  Height: 5\' 3"  (1.6 m)  Weight: 153 lb (69.4 kg)   Body mass index is 27.11 kg/(m^2). Generalized: In no acute distress  Neck: Supple, no carotid bruits  Cardiac: Regular rate rhythm  Pulmonary: Clear to  auscultation bilaterally  Musculoskeletal: No deformity  Neurological examination  Mentation: Alert oriented to time, place, history taking, and causual conversation  Cranial nerve II-XII: Pupils were equal round reactive to light extraocular movements were full, visual field were full on confrontational test. facial sensation and strength were normal. hearing was intact to finger rubbing bilaterally. Uvula tongue midline. head turning and shoulder shrug and were normal and symmetric.Tongue protrusion into cheek strength was normal.  Motor: normal tone, bulk and strength.  Sensory: Intact to fine touch, pinprick, preserved vibratory sensation, and proprioception at toes.  Coordination: Normal finger to nose, heel-to-shin bilaterally  Gait: Rising up from seated position without assistance, wide based  stance,  moderate stride, good arm swing, smooth turning, able to perform tiptoe, and heel walking without difficulty. Limping on the right. No assistive device Romberg signs: Negative  Deep tendon reflexes: Brachioradialis 2/2, biceps 2/2, triceps 2/2, patellar 2/2, Achilles trace, plantar responses were flexor bilaterally.      DIAGNOSTIC DATA (LABS, IMAGING, TESTING) - ASSESSMENT AND PLAN  63 y.o. year old female  has a past medical history of Hypertension; Back pain; Depression; and Spinal stenosis, lumbar region, without neurogenic claudication. here to followup. MRI demonstrated moderate to severe facet hypertrophy, moderate to severe central canal stenosis, right greater than left lateral recess stenosis. Her pain is well controlled with fentanyl patch and gabapentin. She is a patient of Dr. Terrace Arabia who is out of the office.   Patient to continue Fentanyl patch for pain.  Will get drug screen today per narcotic aggreement.  Continue Gabapentin will renew rx.  Restart home exercise program FU in 3 months  Nilda Riggs, [facility], [facility], APRN  [facility] [address],  [address] [place], [place] [address] [phone]

## 2013-10-22 LAB — 733690 12+OXYCODONE+CRT-SCR
AMPHETAMINE SCRN UR: NEGATIVE ng/mL
BARBITURATE SCRN UR: NEGATIVE ng/mL
BENZODIAZEPINE SCREEN, URINE: NEGATIVE ng/mL
CANNABINOIDS UR QL SCN: NEGATIVE ng/mL
COCAINE(METAB.) SCREEN, URINE: NEGATIVE ng/mL
CREATININE(CRT), U: 64.9 mg/dL (ref 20.0–300.0)
FENTANYL+NORFENTANYL UR QL SCN: POSITIVE pg/mL
Meperidine Ur Ql: NEGATIVE ng/mL
Methadone Scn, Ur: NEGATIVE ng/mL
OPIATE SCRN UR: NEGATIVE ng/mL
Oxycodone+Oxymorphone Ur Ql Scn: NEGATIVE ng/mL
PCP Scrn, Ur: NEGATIVE ng/mL
PH UR, DRUG SCRN: 7.5 (ref 4.5–8.9)
Propoxyphene, Screen: NEGATIVE ng/mL
TRAMADOL UR QL SCN: NEGATIVE ng/mL

## 2013-10-22 NOTE — Progress Notes (Signed)
Quick Note:  Gave results to pt that labs are as expected. Pt verbalized understanding. ______

## 2013-10-26 ENCOUNTER — Ambulatory Visit: Payer: BC Managed Care – PPO | Admitting: Nurse Practitioner

## 2013-11-03 ENCOUNTER — Other Ambulatory Visit: Payer: Self-pay | Admitting: Nurse Practitioner

## 2013-11-03 MED ORDER — FENTANYL 12 MCG/HR TD PT72: 12.5000 ug | MEDICATED_PATCH | TRANSDERMAL | Status: DC

## 2013-11-03 NOTE — Telephone Encounter (Signed)
Patient requesting Rx refill for fentaNYL (DURAGESIC - DOSED MCG/HR) 12 MCG/HR.  Please call anytime when ready for pick up, if not available please leave message on vm.  Thanks

## 2013-11-03 NOTE — Telephone Encounter (Signed)
Request forwarded to provider for approval  

## 2013-11-04 NOTE — Telephone Encounter (Signed)
Called pt to inform her that her Rx was ready to be picked up at the front desk and if she has any other problems, questions or concerns to call the office. Pt verbalized understanding. °

## 2013-11-05 NOTE — Telephone Encounter (Signed)
Noted  

## 2013-12-01 ENCOUNTER — Telehealth: Payer: Self-pay | Admitting: Neurology

## 2013-12-01 NOTE — Telephone Encounter (Signed)
Left message that our office received a request for Reasonable Accommodation, but the two sections are:  Part A to be completed by Employee and Part B to be completed by Benefits Department,.  There is nothing for physician to fill out.  I relayed it will be put aside until we hear back from her.

## 2013-12-02 NOTE — Telephone Encounter (Signed)
I received a message that the patient will pick up the form at the front desk.

## 2013-12-03 ENCOUNTER — Encounter: Payer: Self-pay | Admitting: *Deleted

## 2013-12-03 ENCOUNTER — Telehealth: Payer: Self-pay | Admitting: *Deleted

## 2013-12-03 MED ORDER — FENTANYL 12 MCG/HR TD PT72: 12.5000 ug | MEDICATED_PATCH | TRANSDERMAL | Status: DC

## 2013-12-03 NOTE — Telephone Encounter (Signed)
This encounter was created in error - please disregard.

## 2013-12-03 NOTE — Telephone Encounter (Signed)
You can fill it. I will sign

## 2013-12-03 NOTE — Telephone Encounter (Signed)
Calling for Fentanyl patch 12.92mcg (Apply to skin one patch every 3 days) # 10.   May LVM cell # to pick up.

## 2013-12-03 NOTE — Telephone Encounter (Signed)
Called patient and informed her that prescription for fentanyl is ready for pick up at the front desk, informed patient that the office closes at 5 pm. Patient verbalized understanding and states that she will be in to pick this up.

## 2013-12-03 NOTE — Telephone Encounter (Signed)
Done

## 2013-12-31 ENCOUNTER — Other Ambulatory Visit: Payer: Self-pay | Admitting: Nurse Practitioner

## 2013-12-31 NOTE — Telephone Encounter (Signed)
Request forwarded to provider for approval  

## 2013-12-31 NOTE — Telephone Encounter (Signed)
Patient requesting Rx refill for fentaNYL (DURAGESIC - DOSED MCG/HR) 12 MCG/HR.  Please call when ready for pick up. °

## 2014-01-01 MED ORDER — FENTANYL 12 MCG/HR TD PT72: 12.5000 ug | MEDICATED_PATCH | TRANSDERMAL | Status: DC

## 2014-01-04 NOTE — Telephone Encounter (Signed)
Called pt and left message informing her that her Rx was ready to be picked up at the front desk and if she has any other problems, questions or concerns to call the office.  °

## 2014-01-25 ENCOUNTER — Ambulatory Visit: Payer: BC Managed Care – PPO | Admitting: Nurse Practitioner

## 2014-01-25 ENCOUNTER — Encounter: Payer: Self-pay | Admitting: Nurse Practitioner

## 2014-01-25 ENCOUNTER — Ambulatory Visit (INDEPENDENT_AMBULATORY_CARE_PROVIDER_SITE_OTHER): Payer: BC Managed Care – PPO | Admitting: Nurse Practitioner

## 2014-01-25 VITALS — BP 134/66 | HR 61 | Ht 63.0 in | Wt 154.0 lb

## 2014-01-25 DIAGNOSIS — G544 Lumbosacral root disorders, not elsewhere classified: Secondary | ICD-10-CM | POA: Insufficient documentation

## 2014-01-25 DIAGNOSIS — M48061 Spinal stenosis, lumbar region without neurogenic claudication: Secondary | ICD-10-CM

## 2014-01-25 DIAGNOSIS — M4806 Spinal stenosis, lumbar region: Secondary | ICD-10-CM

## 2014-01-25 NOTE — Patient Instructions (Signed)
Continue Fentanyl and Gabapentin at current dose Handicap sticker given F/U in 6 months Fax form to [phone]

## 2014-01-25 NOTE — Progress Notes (Signed)
[facility]  PATIENT: Morgan Swanson DOB: 04/06/1950   REASON FOR VISIT: Follow-up for history of lumbar stenosis, low back pain  HISTORY OF PRESENT ILLNESS:Morgan Swanson is a 63 years old right-handed African American female, to followup for her low back pain, lumbar stenosis,  She had a past medical history of hypertension, hyperlipidemia. She was last seen in this office 10/20/13. She presented for low back pain since 2008, her low back pain radiating to her right lower extremity, involving right plantar foot, she denies significant weakness, no gait difficulty, she has been treated with fentanyl patch5212mcg/q72 hours, which has been very helpful, she only has minimal discomfort. She denies bowel or bladder incontinence. Most recent MRI lumbar was in 11/2006, which showed grade 1 anterolisthesis L4-5 with moderate to severe facet hypertrophy and moderate to severe central canal stenosis, moderate to severe right foraminal narrowing, right greater than left at L4-5. Patient has a signed narcotic agreement with this practice. She has  been doing her exercises, HEP. She is also  taking her gabapentin. She returns for reevaluation. She [facility] and never received the forms back. After looking in several locations I cannot find any forms.   REVIEW OF SYSTEMS: Full 14 system review of systems performed and notable only for those listed, all others are neg:  Constitutional: N/A  Cardiovascular: N/A  Ear/Nose/Throat: N/A  Skin: N/A  Eyes: N/A  Respiratory: N/A  Gastroitestinal: N/A  Hematology/Lymphatic: N/A  Endocrine: N/A Musculoskeletal:N/A  Allergy/Immunology: N/A  Neurological: Numbness Psychiatric: N/A Sleep : NA   ALLERGIES: Allergies  Allergen Reactions  . Codeine   . Penicillins     HOME MEDICATIONS: Outpatient Prescriptions Prior to Visit  Medication Sig Dispense Refill  . amLODipine (NORVASC) 10 MG tablet Take 1 tablet by mouth  daily.      . CRESTOR 20 MG tablet Take 1 tablet by mouth daily.      . fentaNYL (DURAGESIC - DOSED MCG/HR) 12 MCG/HR Place 1 patch (12.5 mcg total) onto the skin every 3 (three) days. Patient has drug agreement  10 patch  0  . furosemide (LASIX) 20 MG tablet Take 1 tablet by mouth daily.      Marland Kitchen. gabapentin (NEURONTIN) 100 MG capsule Take 3 capsules (300 mg total) by mouth at bedtime.  90 capsule  11  . meloxicam (MOBIC) 7.5 MG tablet Take 1 tablet by mouth 2 (two) times daily.      Marland Kitchen. omeprazole (PRILOSEC) 40 MG capsule Take 1 capsule by mouth daily.       No facility-administered medications prior to visit.    PAST MEDICAL HISTORY: Past Medical History  Diagnosis Date  . Hypertension   . Back pain   . Depression   . Spinal stenosis, lumbar region, without neurogenic claudication     PAST SURGICAL HISTORY: Past Surgical History  Procedure Laterality Date  . Vaginal hysterectomy  1983    FAMILY HISTORY: Family History  Problem Relation Age of Onset  . Breast cancer Mother   . Diabetes Mother   . Fibromyalgia Mother   . Lung cancer Father   . Stroke Brother     SOCIAL HISTORY: History   Social History  . Marital Status: Single    Spouse Name: N/A    Number of Children: 2  . Years of Education: HS   Occupational History  .  [employer]   Social History Main Topics  . Smoking status: Former Smoker  Quit date: 04/03/1999  . Smokeless tobacco: Never Used  . Alcohol Use: 0.6 oz/week    1 Glasses of wine per week     Comment: oc Holidays  . Drug Use: No  . Sexual Activity: Not on file   Other Topics Concern  . Not on file   Social History Narrative   Pt lives at home with her four grandchildren. Patient is single.   Caffeine Use- 2 cups of tea daily.   Right handed.     PHYSICAL EXAM  Filed Vitals:   01/25/14 1524  BP: 134/66  Pulse: 61  Height: 5\' 3"  (1.6 m)  Weight: 154 lb (69.854 kg)   Body mass index is 27.29  kg/(m^2). Generalized: In no acute distress  Neck: Supple, no carotid bruits  Cardiac: Regular rate rhythm  Pulmonary: Clear to auscultation bilaterally  Musculoskeletal: No deformity  Neurological examination  Mentation: Alert oriented to time, place, history taking, and causual conversation  Cranial nerve II-XII: Pupils were equal round reactive to light extraocular movements were full, visual field were full on confrontational test. facial sensation and strength were normal. hearing was intact to finger rubbing bilaterally. Uvula tongue midline. head turning and shoulder shrug and were normal and symmetric.Tongue protrusion into cheek strength was normal.  Motor: normal tone, bulk and strength.  Sensory: Intact to fine touch, pinprick, preserved vibratory sensation, and proprioception at toes.  Coordination: Normal finger to nose, heel-to-shin bilaterally  Gait: Rising up from seated position without assistance, wide based stance, moderate stride, good arm swing, smooth turning, able to perform tiptoe, and heel walking without difficulty. Limping on the right. No assistive device  Romberg signs: Negative  Deep tendon reflexes: Brachioradialis 2/2, biceps 2/2, triceps 2/2, patellar 2/2, Achilles trace, plantar responses were flexor bilaterally.      DIAGNOSTIC DATA (LABS, IMAGING, TESTING) - ASSESSMENT AND PLAN  63 y.o. year old female  has a past medical history of Spinal stenosis, lumbar region,  here to follow up. She continues to work full-time. She remains on fentanyl and gabapentin. She does not need refills.The patient is a current patient of Morgan Swanson  who is out of the office this afternoon.  This note is sent to the work in doctor.     Continue Fentanyl and Gabapentin at current dose Handicap sticker given F/U in 6 months Fax form to [phone] Morgan RiggsNancy Carolyn Swanson, [facility] EastGNP, [facility]BC, APRN  [facility] [address], [address] [place], [place] [address] [phone]  [phone]

## 2014-02-01 ENCOUNTER — Other Ambulatory Visit: Payer: Self-pay | Admitting: Nurse Practitioner

## 2014-02-01 MED ORDER — FENTANYL 12 MCG/HR TD PT72: 12.5000 ug | MEDICATED_PATCH | TRANSDERMAL | Status: DC

## 2014-02-01 NOTE — Telephone Encounter (Signed)
Patient requesting Rx refill for fentaNYL (DURAGESIC - DOSED MCG/HR) 12 MCG/HR.  Please call when ready for pick up.

## 2014-02-01 NOTE — Telephone Encounter (Signed)
[facility] Dr Terrace ArabiaYan is out of the office this afternoon.

## 2014-02-01 NOTE — Telephone Encounter (Signed)
I called the patient to let them know their Rx for Fentanyl Duragesic Patch was ready for pickup. Patient was instructed to bring Photo ID. 

## 2014-02-17 ENCOUNTER — Encounter: Payer: Self-pay | Admitting: Neurology

## 2014-02-23 ENCOUNTER — Encounter: Payer: Self-pay | Admitting: Neurology

## 2014-03-01 ENCOUNTER — Other Ambulatory Visit: Payer: Self-pay | Admitting: Nurse Practitioner

## 2014-03-01 NOTE — Telephone Encounter (Signed)
Request entered, forwarded to provider for approval.  

## 2014-03-01 NOTE — Telephone Encounter (Signed)
Patient requesting Rx refill for fentaNYL (DURAGESIC - DOSED MCG/HR) 12 MCG/HR.  Please call when ready for pick up. °

## 2014-03-02 MED ORDER — FENTANYL 12 MCG/HR TD PT72: 12.5000 ug | MEDICATED_PATCH | TRANSDERMAL | Status: DC

## 2014-03-03 NOTE — Telephone Encounter (Addendum)
I called the patient to let them know their Rx for Fetanyl Patch was ready for pickup. I had to leave a detailed message. Patient was instructed to bring Photo ID.

## 2014-03-08 NOTE — Progress Notes (Signed)
I reviewed note and agree with plan.   Itzell Bendavid R. Cathren Sween, MD  Certified in Neurology, Neurophysiology and Neuroimaging  [facility] [address], [address] [place], [place] [address] [phone]   

## 2014-03-31 ENCOUNTER — Other Ambulatory Visit: Payer: Self-pay | Admitting: Neurology

## 2014-03-31 MED ORDER — FENTANYL 12 MCG/HR TD PT72: 12.5000 ug | MEDICATED_PATCH | TRANSDERMAL | Status: DC

## 2014-03-31 NOTE — Telephone Encounter (Signed)
Request entered, forwarded to provider for approval.  

## 2014-03-31 NOTE — Telephone Encounter (Signed)
Patient is calling to get a written Rx for Fentanyl patch. Please call patient when ready for pickup.

## 2014-03-31 NOTE — Telephone Encounter (Signed)
I called the patient to let them know their Rx for Fentanyl was ready for pickup. Patient was instructed to bring Photo ID. 

## 2014-04-28 ENCOUNTER — Other Ambulatory Visit: Payer: Self-pay | Admitting: Nurse Practitioner

## 2014-04-28 MED ORDER — FENTANYL 12 MCG/HR TD PT72: 12.5000 ug | MEDICATED_PATCH | TRANSDERMAL | Status: DC

## 2014-04-28 NOTE — Telephone Encounter (Signed)
Request entered, forwarded to provider for approval.  

## 2014-04-28 NOTE — Telephone Encounter (Signed)
Pt is calling to request a written Rx for fentaNYL (DURAGESIC - DOSED MCG/HR) 12 MCG/HR. Please call when ready for pick up.

## 2014-05-03 ENCOUNTER — Telehealth: Payer: Self-pay

## 2014-05-03 NOTE — Telephone Encounter (Signed)
Called patient to inform Rx ready for pick up at front desk. No answer. Left vmail. 

## 2014-05-31 ENCOUNTER — Telehealth: Payer: Self-pay | Admitting: *Deleted

## 2014-05-31 MED ORDER — FENTANYL 12 MCG/HR TD PT72: 12.5000 ug | MEDICATED_PATCH | TRANSDERMAL | Status: DC

## 2014-05-31 NOTE — Telephone Encounter (Signed)
Called patient and informed Rx ready for pick up at front desk. Patient verbalized understanding.  

## 2014-05-31 NOTE — Telephone Encounter (Signed)
Please call patient for pickup 

## 2014-05-31 NOTE — Telephone Encounter (Signed)
Patient needs a refill on the fentanyl patch.

## 2014-07-01 ENCOUNTER — Other Ambulatory Visit: Payer: Self-pay | Admitting: *Deleted

## 2014-07-01 MED ORDER — FENTANYL 12 MCG/HR TD PT72: 12.5000 ug | MEDICATED_PATCH | TRANSDERMAL | Status: DC

## 2014-07-01 NOTE — Telephone Encounter (Signed)
Dr Yan is out of the office.  Forwarding request to WID for review.   

## 2014-07-01 NOTE — Telephone Encounter (Signed)
Patient calling in to get refill of fentaNYL (DURAGESIC - DOSED MCG/HR) 12 MCG/HR, informed patient that she will be called when medication is ready for pick up.

## 2014-07-28 ENCOUNTER — Encounter: Payer: Self-pay | Admitting: Nurse Practitioner

## 2014-07-28 ENCOUNTER — Ambulatory Visit (INDEPENDENT_AMBULATORY_CARE_PROVIDER_SITE_OTHER): Payer: BC Managed Care – PPO | Admitting: Nurse Practitioner

## 2014-07-28 VITALS — BP 128/64 | HR 72 | Ht 63.0 in | Wt 152.0 lb

## 2014-07-28 DIAGNOSIS — G544 Lumbosacral root disorders, not elsewhere classified: Secondary | ICD-10-CM | POA: Diagnosis not present

## 2014-07-28 DIAGNOSIS — M4806 Spinal stenosis, lumbar region: Secondary | ICD-10-CM

## 2014-07-28 DIAGNOSIS — M48061 Spinal stenosis, lumbar region without neurogenic claudication: Secondary | ICD-10-CM

## 2014-07-28 MED ORDER — GABAPENTIN 100 MG PO CAPS: 300.0000 mg | ORAL_CAPSULE | Freq: Every day | ORAL | Status: DC

## 2014-07-28 MED ORDER — FENTANYL 12 MCG/HR TD PT72: 12.5000 ug | MEDICATED_PATCH | TRANSDERMAL | Status: DC

## 2014-07-28 NOTE — Patient Instructions (Signed)
Continue fentanyl and gabapentin at current dose Rx for both No lifting over 20 pounds Follow-up in 6-8 months

## 2014-07-28 NOTE — Progress Notes (Signed)
[facility]  PATIENT: Morgan Swanson DOB: 1950-11-27   REASON FOR VISIT: Follow-up for lumbar stenosis , low back pain HISTORY FROM: Patient    HISTORY OF PRESENT ILLNESS::Morgan Swanson is a 64 years old right-handed African American female, to followup for her low back pain, lumbar stenosis,  She had a past medical history of hypertension, hyperlipidemia. She was last seen in this office 01/25/14. She presented for low back pain since 2008, her low back pain radiating to her right lower extremity, involving right plantar foot, she denies significant weakness, no gait difficulty, she has been treated with fentanyl patch26mcg/q72 hours, which has been very helpful, she only has minimal discomfort. She denies bowel or bladder incontinence. Most recent MRI lumbar was in 11/2006, which showed grade 1 anterolisthesis L4-5 with moderate to severe facet hypertrophy and moderate to severe central canal stenosis, moderate to severe right foraminal narrowing, right greater than left at L4-5. Patient has a signed narcotic agreement with this practice. She has been doing her exercises, HEP. She is also taking her gabapentin. She returns for reevaluation.   REVIEW OF SYSTEMS: Full 14 system review of systems performed and notable only for those listed, all others are neg:  Constitutional: neg  Cardiovascular: neg Ear/Nose/Throat: neg  Skin: neg Eyes: neg Respiratory: neg Gastroitestinal: neg  Hematology/Lymphatic: neg  Endocrine: neg Musculoskeletal:neg Allergy/Immunology: neg Neurological: Numbness Psychiatric: neg Sleep : neg   ALLERGIES: Allergies  Allergen Reactions  . Codeine   . Penicillins     HOME MEDICATIONS: Outpatient Prescriptions Prior to Visit  Medication Sig Dispense Refill  . amLODipine (NORVASC) 10 MG tablet Take 1 tablet by mouth daily.    . CRESTOR 20 MG tablet Take 1 tablet by mouth daily.    . fentaNYL (DURAGESIC - DOSED MCG/HR) 12 MCG/HR  Place 1 patch (12.5 mcg total) onto the skin every 3 (three) days. Patient has drug agreement 10 patch 0  . furosemide (LASIX) 20 MG tablet Take 1 tablet by mouth daily.    Marland Kitchen gabapentin (NEURONTIN) 100 MG capsule Take 3 capsules (300 mg total) by mouth at bedtime. (Patient taking differently: Take 300 mg by mouth at bedtime. Takes one at lunch then 2 at bedtime) 90 capsule 11  . meloxicam (MOBIC) 7.5 MG tablet Take 1 tablet by mouth 2 (two) times daily.    Marland Kitchen omeprazole (PRILOSEC) 40 MG capsule Take 1 capsule by mouth daily.     No facility-administered medications prior to visit.    PAST MEDICAL HISTORY: Past Medical History  Diagnosis Date  . Hypertension   . Back pain   . Depression   . Spinal stenosis, lumbar region, without neurogenic claudication     PAST SURGICAL HISTORY: Past Surgical History  Procedure Laterality Date  . Vaginal hysterectomy  1983    FAMILY HISTORY: Family History  Problem Relation Age of Onset  . Breast cancer Mother   . Diabetes Mother   . Fibromyalgia Mother   . Lung cancer Father   . Stroke Brother     SOCIAL HISTORY: History   Social History  . Marital Status: Single    Spouse Name: N/A  . Number of Children: 2  . Years of Education: HS   Occupational History  .  [employer]   Social History Main Topics  . Smoking status: Former Smoker    Quit date: 04/03/1999  . Smokeless tobacco: Never Used  . Alcohol Use: 0.6 oz/week    1 Glasses of wine  per week     Comment: oc Holidays  . Drug Use: No  . Sexual Activity: Not on file   Other Topics Concern  . Not on file   Social History Narrative   Pt lives at home with her four grandchildren. Patient is single.   Caffeine Use- 2 cups of tea daily.   Right handed.     PHYSICAL EXAM  Filed Vitals:   07/28/14 1534  BP: 128/64  Pulse: 72  Height: 5\' 3"  (1.6 m)  Weight: 152 lb (68.947 kg)   Body mass index is 26.93 kg/(m^2). Generalized: In no acute distress   Neck: Supple, no carotid bruits  Cardiac: Regular rate rhythm  Musculoskeletal: No deformity  Neurological examination  Mentation: Alert oriented to time, place, history taking, and causual conversation  Cranial nerve II-XII: Pupils were equal round reactive to light extraocular movements were full, visual field were full on confrontational test. facial sensation and strength were normal. hearing was intact to finger rubbing bilaterally. Uvula tongue midline. head turning and shoulder shrug and were normal and symmetric.Tongue protrusion into cheek strength was normal.  Motor: normal tone, bulk and strength. No focal weakness Sensory: Intact to fine touch, pinprick, preserved vibratory sensation, and proprioception at toes.  Coordination: Normal finger to nose, heel-to-shin bilaterally  Gait: Rising up from seated position without assistance, wide based stance, moderate stride, good arm swing, smooth turning, able to perform tiptoe, and heel walking without difficulty. Limping on the right. No assistive device  Romberg signs: Negative  Deep tendon reflexes: Brachioradialis 2/2, biceps 2/2, triceps 2/2, patellar 2/2, Achilles trace, plantar responses were flexor bilaterally.     DIAGNOSTIC DATA (LABS, IMAGING, TESTING) -  ASSESSMENT AND PLAN  64 y.o. year old female  has a past medical history of spinal stenosis lumbar region here to follow-up. She continues to work full-time. Her pain is controlled with fentanyl and gabapentin. Continue fentanyl and gabapentin at current dose Rx for both Last drug screen in July ok will recheck next visit No lifting over 20 pounds Follow-up in 6-8 months Nilda RiggsNancy Carolyn Martin, [facility]GNP, [facility]BC, APRN  [facility] [address], [address] [place], [place] [phone]

## 2014-07-30 NOTE — Progress Notes (Signed)
I have reviewed and agreed above plan. 

## 2014-08-31 ENCOUNTER — Telehealth: Payer: Self-pay | Admitting: *Deleted

## 2014-08-31 ENCOUNTER — Other Ambulatory Visit: Payer: Self-pay | Admitting: Neurology

## 2014-08-31 MED ORDER — FENTANYL 12 MCG/HR TD PT72: 12.5000 ug | MEDICATED_PATCH | TRANSDERMAL | Status: DC

## 2014-08-31 NOTE — Telephone Encounter (Signed)
Patient called requesting refill for fentaNYL (DURAGESIC - DOSED MCG/HR) 12 MCG/HR Patient can be reached at [phone]. Pt advised RX will be ready within 24hrs unless otherwise heard back from RN.

## 2014-08-31 NOTE — Telephone Encounter (Signed)
Rx for Fentanyl patches signed and placed up front for pick up.

## 2014-08-31 NOTE — Telephone Encounter (Signed)
Request entered, forwarded to provider for approval.  

## 2014-09-28 ENCOUNTER — Other Ambulatory Visit: Payer: Self-pay | Admitting: Neurology

## 2014-09-28 MED ORDER — FENTANYL 12 MCG/HR TD PT72: 12.5000 ug | MEDICATED_PATCH | TRANSDERMAL | Status: DC

## 2014-09-28 NOTE — Telephone Encounter (Signed)
Request entered, forwarded to provider for approval.  

## 2014-09-28 NOTE — Telephone Encounter (Signed)
Patient called requesting a refill for fentaNYL (DURAGESIC - DOSED MCG/HR) 12 MCG/HR. Patient was advised the script will be ready for pick up in 24hrs unless she hears otherwise by the nurse. Patient can be reached @ [phone]

## 2014-09-29 ENCOUNTER — Telehealth: Payer: Self-pay | Admitting: *Deleted

## 2014-09-29 NOTE — Telephone Encounter (Signed)
Rx for Fentanyl placed at front for pick up.

## 2014-10-27 ENCOUNTER — Other Ambulatory Visit: Payer: Self-pay | Admitting: Neurology

## 2014-10-27 ENCOUNTER — Telehealth: Payer: Self-pay | Admitting: *Deleted

## 2014-10-27 MED ORDER — FENTANYL 12 MCG/HR TD PT72: 12.5000 ug | MEDICATED_PATCH | TRANSDERMAL | Status: DC

## 2014-10-27 NOTE — Telephone Encounter (Signed)
Request entered, forwarded to provider for approval.  

## 2014-10-27 NOTE — Telephone Encounter (Signed)
Fentanyl rx placed up front for patient to pick up.

## 2014-10-27 NOTE — Telephone Encounter (Signed)
Patient is calling to get a written Rx for fentaNYL (DURAGESIC - DOSED MCG/HR) 12 MCG/HR. I advised the patient Rx will be ready in 24 hours unless the nurse advises otherwise. Thank you.

## 2014-11-29 ENCOUNTER — Encounter: Payer: Self-pay | Admitting: *Deleted

## 2014-11-29 ENCOUNTER — Other Ambulatory Visit: Payer: Self-pay | Admitting: Neurology

## 2014-11-29 MED ORDER — FENTANYL 12 MCG/HR TD PT72: 12.5000 ug | MEDICATED_PATCH | TRANSDERMAL | Status: DC

## 2014-11-29 NOTE — Telephone Encounter (Signed)
Dr Terrace Arabia is out of the office, request forwarded to [facility] for review.

## 2014-11-29 NOTE — Telephone Encounter (Signed)
Patient is calling to order written Rx fentanyl 12 mcg/HR.  Thanks!

## 2014-11-29 NOTE — Progress Notes (Signed)
Rx for fentanyl 12 mcg/hr placed up front per Dr. Lucia Gaskins request for pt to pick up.

## 2014-12-28 ENCOUNTER — Other Ambulatory Visit: Payer: Self-pay | Admitting: Nurse Practitioner

## 2014-12-28 ENCOUNTER — Other Ambulatory Visit: Payer: Self-pay | Admitting: *Deleted

## 2014-12-28 MED ORDER — FENTANYL 12 MCG/HR TD PT72: 12.5000 ug | MEDICATED_PATCH | TRANSDERMAL | Status: DC

## 2014-12-28 NOTE — Telephone Encounter (Signed)
Patient called to request refill fentaNYL (DURAGESIC - DOSED MCG/HR) 12 MCG/HR

## 2014-12-28 NOTE — Telephone Encounter (Signed)
Pended order sent to CM/NP for signature, then will print then call pt to pick up.

## 2014-12-28 NOTE — Telephone Encounter (Signed)
Called patient and left her message stating RX ready for pick up. Relayed office hours.

## 2015-01-11 ENCOUNTER — Ambulatory Visit (INDEPENDENT_AMBULATORY_CARE_PROVIDER_SITE_OTHER): Payer: BC Managed Care – PPO | Admitting: Nurse Practitioner

## 2015-01-11 ENCOUNTER — Encounter: Payer: Self-pay | Admitting: Nurse Practitioner

## 2015-01-11 VITALS — BP 156/86 | HR 70 | Ht 63.0 in | Wt 151.2 lb

## 2015-01-11 DIAGNOSIS — Z5181 Encounter for therapeutic drug level monitoring: Secondary | ICD-10-CM | POA: Diagnosis not present

## 2015-01-11 DIAGNOSIS — M48061 Spinal stenosis, lumbar region without neurogenic claudication: Secondary | ICD-10-CM

## 2015-01-11 DIAGNOSIS — G544 Lumbosacral root disorders, not elsewhere classified: Secondary | ICD-10-CM | POA: Diagnosis not present

## 2015-01-11 DIAGNOSIS — M4806 Spinal stenosis, lumbar region: Secondary | ICD-10-CM | POA: Diagnosis not present

## 2015-01-11 NOTE — Patient Instructions (Signed)
Continue fentanyl does not need refill Continue gabapentin at current dose  Last drug screen in July 2015  will recheck today No lifting over 20 pounds Follow-up in 6-8 months

## 2015-01-11 NOTE — Progress Notes (Signed)
[facility]  PATIENT: Morgan Swanson DOB: 1951-02-04   REASON FOR VISIT: follow up for lumbar stenosis low back pain HISTORY FROM:patient    HISTORY OF PRESENT ILLNESS:Morgan Swanson is a 64 years old right-handed African American female, returns to followup for her low back pain, lumbar stenosis,  She had a past medical history of hypertension, hyperlipidemia. She was last seen in this office 07/28/14.  She presented for low back pain since 2008, her low back pain radiating to her right lower extremity, involving right plantar foot, she denies significant weakness, no gait difficulty, she has been treated with fentanyl patch41mcg/q72 hours, which has been very helpful, she only has minimal discomfort. She denies bowel or bladder incontinence. Most recent MRI lumbar was in 11/2006, which showed grade 1 anterolisthesis L4-5 with moderate to severe facet hypertrophy and moderate to severe central canal stenosis, moderate to severe right foraminal narrowing, right greater than left at L4-5. Patient has a signed narcotic agreement with this practice. She has been doing her exercises, HEP. She is also taking her gabapentin. She returns for reevaluation.    REVIEW OF SYSTEMS: Full 14 system review of systems performed and notable only for those listed, all others are neg:  Constitutional: neg  Cardiovascular: neg Ear/Nose/Throat: neg  Skin: neg Eyes: neg Respiratory: neg Gastroitestinal: neg  Hematology/Lymphatic: neg  Endocrine: neg Musculoskeletal:neg Allergy/Immunology: neg Neurological: neg Psychiatric: neg Sleep : neg   ALLERGIES: Allergies  Allergen Reactions  . Codeine   . Penicillins     HOME MEDICATIONS: Outpatient Prescriptions Prior to Visit  Medication Sig Dispense Refill  . amLODipine (NORVASC) 10 MG tablet Take 1 tablet by mouth daily.    . CRESTOR 20 MG tablet Take 1 tablet by mouth daily.    . fentaNYL (DURAGESIC - DOSED MCG/HR) 12 MCG/HR  Place 1 patch (12.5 mcg total) onto the skin every 3 (three) days. Patient has drug agreement 10 patch 0  . furosemide (LASIX) 20 MG tablet Take 1 tablet by mouth daily.    Marland Kitchen gabapentin (NEURONTIN) 100 MG capsule Take 3 capsules (300 mg total) by mouth at bedtime. Takes one at lunch then 2 at bedtime 90 capsule 11  . lisinopril (PRINIVIL,ZESTRIL) 10 MG tablet Take 10 mg by mouth daily.  0  . meloxicam (MOBIC) 7.5 MG tablet Take 1 tablet by mouth 2 (two) times daily.    Marland Kitchen omeprazole (PRILOSEC) 40 MG capsule Take 1 capsule by mouth daily.     No facility-administered medications prior to visit.    PAST MEDICAL HISTORY: Past Medical History  Diagnosis Date  . Hypertension   . Back pain   . Depression   . Spinal stenosis, lumbar region, without neurogenic claudication     PAST SURGICAL HISTORY: Past Surgical History  Procedure Laterality Date  . Vaginal hysterectomy  1983    FAMILY HISTORY: Family History  Problem Relation Age of Onset  . Breast cancer Mother   . Diabetes Mother   . Fibromyalgia Mother   . Lung cancer Father   . Stroke Brother     SOCIAL HISTORY: Social History   Social History  . Marital Status: Single    Spouse Name: N/A  . Number of Children: 2  . Years of Education: HS   Occupational History  .  [employer]   Social History Main Topics  . Smoking status: Former Smoker    Quit date: 04/03/1999  . Smokeless tobacco: Never Used  . Alcohol Use: 0.6  oz/week    1 Glasses of wine per week     Comment: oc Holidays  . Drug Use: No  . Sexual Activity: Not on file   Other Topics Concern  . Not on file   Social History Narrative   Pt lives at home with her four grandchildren. Patient is single.   Caffeine Use- 2 cups of tea daily.   Right handed.     PHYSICAL EXAM  Filed Vitals:   01/11/15 1459  BP: 156/86  Pulse: 70  Height:  (1.6 m)  Weight: 151 lb 3.2 oz (68.584 kg)   Body mass index is 26.79 kg/(m^2). Generalized:  In no acute distress  Neck: Supple, no carotid bruits  Cardiac: Regular rate rhythm  Musculoskeletal: No deformity  Neurological examination  Mentation: Alert oriented to time, place, history taking, and causual conversation  Cranial nerve II-XII: Pupils were equal round reactive to light extraocular movements were full, visual field were full on confrontational test. facial sensation and strength were normal. hearing was intact to finger rubbing bilaterally. Uvula tongue midline. head turning and shoulder shrug and were normal and symmetric.Tongue protrusion into cheek strength was normal.  Motor: normal tone, bulk and strength. No focal weakness Sensory: Intact to fine touch, pinprick, preserved vibratory sensation, and proprioception at toes.  Coordination: Normal finger to nose, heel-to-shin bilaterally  Gait: Rising up from seated position without assistance, wide based stance, moderate stride, good arm swing, smooth turning, able to perform tiptoe, and heel walking without difficulty. Tandem is steady.  No assistive device  Romberg signs: Negative  Deep tendon reflexes: Brachioradialis 2/2, biceps 2/2, triceps 2/2, patellar 2/2, Achilles trace, plantar responses were flexor bilaterally.  DIAGNOSTIC DATA (LABS, IMAGING, TESTING) - ASSESSMENT AND PLAN  64 y.o. year old female  a past medical history of spinal stenosis lumbar region here to follow-up. She continues to work full-time. Her pain is controlled with fentanyl and gabapentin.   Continue fentanyl just received refill  Continue gabapentin at current dose  Does not need refills Drug screen today No lifting over 20 pounds Follow-up in 6-8 months Nilda Riggs, [facility], [facility], APRN  [facility] [address] [place], [place] [phone]

## 2015-01-12 ENCOUNTER — Telehealth: Payer: Self-pay | Admitting: *Deleted

## 2015-01-12 LAB — 733690 12+OXYCODONE+CRT-SCR
AMPHETAMINE SCRN UR: NEGATIVE ng/mL
BARBITURATE SCRN UR: NEGATIVE ng/mL
BENZODIAZEPINE SCREEN, URINE: NEGATIVE ng/mL
COCAINE(METAB.) SCREEN, URINE: NEGATIVE ng/mL
Cannabinoids Ur Ql Scn: NEGATIVE ng/mL
Creatinine(Crt), U: 116.6 mg/dL (ref 20.0–300.0)
Fentanyl, Urine: POSITIVE pg/mL
Meperidine Screen, Urine: NEGATIVE ng/mL
Methadone Scn, Ur: NEGATIVE ng/mL
Opiate Scrn, Ur: NEGATIVE ng/mL
Oxycodone+Oxymorphone Ur Ql Scn: NEGATIVE ng/mL
PCP Scrn, Ur: NEGATIVE ng/mL
PROPOXYPHENE SCREEN: NEGATIVE ng/mL
Ph of Urine: 7.4 (ref 4.5–8.9)
Tramadol Ur Ql Scn: NEGATIVE ng/mL

## 2015-01-12 NOTE — Telephone Encounter (Signed)
-----   Message from Nilda RiggsNancy Carolyn Martin, NP sent at 01/12/2015  3:57 PM EDT ----- Drug screen good please call the patient

## 2015-01-12 NOTE — Telephone Encounter (Signed)
I called pt and gave her the results of her urine drug sceen.   She verbalized understanding.

## 2015-01-14 NOTE — Progress Notes (Signed)
I have reviewed and agreed above plan. 

## 2015-01-24 ENCOUNTER — Other Ambulatory Visit: Payer: Self-pay

## 2015-01-24 ENCOUNTER — Telehealth: Payer: Self-pay | Admitting: Nurse Practitioner

## 2015-01-24 NOTE — Telephone Encounter (Signed)
Request entered, forwarded to provider for approval.  

## 2015-01-24 NOTE — Telephone Encounter (Signed)
Pt needs refill on fentaNYL (DURAGESIC - DOSED MCG/HR) 12 MCG/HR. Thank you

## 2015-01-25 ENCOUNTER — Telehealth: Payer: Self-pay | Admitting: *Deleted

## 2015-01-25 MED ORDER — FENTANYL 12 MCG/HR TD PT72: 12.5000 ug | MEDICATED_PATCH | TRANSDERMAL | Status: DC

## 2015-01-25 NOTE — Telephone Encounter (Signed)
Fentanyl rx placed up front for pick up. 

## 2015-02-21 ENCOUNTER — Other Ambulatory Visit: Payer: Self-pay

## 2015-02-21 ENCOUNTER — Telehealth: Payer: Self-pay | Admitting: Nurse Practitioner

## 2015-02-21 ENCOUNTER — Telehealth: Payer: Self-pay | Admitting: *Deleted

## 2015-02-21 MED ORDER — FENTANYL 12 MCG/HR TD PT72: 12.5000 ug | MEDICATED_PATCH | TRANSDERMAL | Status: DC

## 2015-02-21 NOTE — Telephone Encounter (Signed)
Fentanyl rx placed up front for pick up. 

## 2015-02-21 NOTE — Telephone Encounter (Signed)
Pt needs refill on fentaNYL (DURAGESIC - DOSED MCG/HR) 12 MCG/HR. Thank you °

## 2015-02-21 NOTE — Telephone Encounter (Signed)
Dr Terrace ArabiaYan has approved Rx

## 2015-02-21 NOTE — Telephone Encounter (Signed)
Request entered, forwarded to provider for approval.  

## 2015-03-24 ENCOUNTER — Other Ambulatory Visit: Payer: Self-pay | Admitting: Neurology

## 2015-03-24 MED ORDER — FENTANYL 12 MCG/HR TD PT72: 12.5000 ug | MEDICATED_PATCH | TRANSDERMAL | Status: DC

## 2015-03-24 NOTE — Telephone Encounter (Signed)
Dr Terrace ArabiaYan and CM are both out of the office.  [facility].

## 2015-03-24 NOTE — Telephone Encounter (Signed)
Called patient and informed Rx ready for pick up at front desk. Patient verbalized understanding.  

## 2015-03-24 NOTE — Telephone Encounter (Signed)
I see that Morgan Swanson revealed this order in November but I do not see that Dr. Terrace ArabiaYan fills this medication.? Is her pain management doctor?

## 2015-03-24 NOTE — Telephone Encounter (Signed)
Patient called and requested a refill on Rx. fentaNYL (DURAGESIC - DOSED MCG/HR) 12 MCG/HR. Please call and advise.

## 2015-04-27 ENCOUNTER — Other Ambulatory Visit: Payer: Self-pay | Admitting: Neurology

## 2015-04-27 ENCOUNTER — Telehealth: Payer: Self-pay | Admitting: *Deleted

## 2015-04-27 MED ORDER — FENTANYL 12 MCG/HR TD PT72
12.5000 ug | MEDICATED_PATCH | TRANSDERMAL | Status: DC
Start: 2015-04-27 — End: 2015-05-25

## 2015-04-27 NOTE — Telephone Encounter (Signed)
Patient is calling to get a written Rx for fentaNYL (DURAGESIC - DOSED MCG/HR) 12 MCG/HR. I advised the Rx will be ready in 24 hours unless otherwise advised by the nurse. Thank you.

## 2015-04-27 NOTE — Telephone Encounter (Signed)
Rx for Fentanyl placed up front for pick up. 

## 2015-04-27 NOTE — Telephone Encounter (Signed)
Request entered, forwarded to provider for approval.  

## 2015-05-25 ENCOUNTER — Telehealth: Payer: Self-pay | Admitting: Neurology

## 2015-05-25 MED ORDER — FENTANYL 12 MCG/HR TD PT72: 12.5000 ug | MEDICATED_PATCH | TRANSDERMAL | Status: DC

## 2015-05-25 NOTE — Telephone Encounter (Signed)
Patient is calling to get a written Rx for fentaNYL (DURAGESIC - DOSED MCG/HR) 12 MCG/HR. I advised the Rx will be ready in 24 hours unless otherwise advised by the patient.

## 2015-05-25 NOTE — Telephone Encounter (Signed)
Refill appropriate - will be postdated for 05/27/15.

## 2015-05-25 NOTE — Telephone Encounter (Signed)
Rx placed up front for pick up. 

## 2015-06-27 ENCOUNTER — Telehealth: Payer: Self-pay | Admitting: Neurology

## 2015-06-27 MED ORDER — FENTANYL 12 MCG/HR TD PT72: 12.5000 ug | MEDICATED_PATCH | TRANSDERMAL | Status: DC

## 2015-06-27 NOTE — Telephone Encounter (Signed)
Pt needs refill on fentaNYL (DURAGESIC - DOSED MCG/HR) 12 MCG/HR thank you

## 2015-06-27 NOTE — Telephone Encounter (Signed)
Printed,signed and placed up front for pick up

## 2015-07-07 ENCOUNTER — Ambulatory Visit (INDEPENDENT_AMBULATORY_CARE_PROVIDER_SITE_OTHER): Payer: BC Managed Care – PPO | Admitting: Nurse Practitioner

## 2015-07-07 ENCOUNTER — Encounter: Payer: Self-pay | Admitting: Nurse Practitioner

## 2015-07-07 VITALS — BP 133/73 | HR 60 | Ht 63.0 in | Wt 144.6 lb

## 2015-07-07 DIAGNOSIS — G544 Lumbosacral root disorders, not elsewhere classified: Secondary | ICD-10-CM

## 2015-07-07 DIAGNOSIS — M4806 Spinal stenosis, lumbar region: Secondary | ICD-10-CM

## 2015-07-07 DIAGNOSIS — M48061 Spinal stenosis, lumbar region without neurogenic claudication: Secondary | ICD-10-CM

## 2015-07-07 MED ORDER — GABAPENTIN 100 MG PO CAPS: 300.0000 mg | ORAL_CAPSULE | Freq: Every day | ORAL | Status: DC

## 2015-07-07 NOTE — Progress Notes (Signed)
[facility]  PATIENT: Morgan Swanson DOB: December 13, 1950   REASON FOR VISIT follow-up for spinal stenosis lumbar region:  HISTORY FROM: Patient    HISTORY OF PRESENT ILLNESS:Morgan Swanson is a 65 years old right-handed African American female, returns to followup for her low back pain, lumbar stenosis,  She had a past medical history of hypertension, hyperlipidemia. She was last seen in this office 01/11/15.  She presented for low back pain since 2008, her low back pain radiating to her right lower extremity, involving right plantar foot, she denies significant weakness, no gait difficulty, she has been treated with fentanyl patch96mcg/q72 hours, which has been very helpful, she only has minimal discomfort. She denies bowel or bladder incontinence. Most recent MRI lumbar was in 11/2006, which showed grade 1 anterolisthesis L4-5 with moderate to severe facet hypertrophy and moderate to severe central canal stenosis, moderate to severe right foraminal narrowing, right greater than left at L4-5. Patient has a signed narcotic agreement with this practice. She has been doing her exercises, HEP. She is also taking her gabapentin. She was recently placed trazodone for sleep with good benefit. She returns for reevaluation.      REVIEW OF SYSTEMS: Full 14 system review of systems performed and notable only for those listed, all others are neg:  Constitutional: neg  Cardiovascular: neg Ear/Nose/Throat: neg  Skin: neg Eyes: neg Respiratory: neg Gastroitestinal: neg  Hematology/Lymphatic: neg  Endocrine: neg Musculoskeletal:neg Allergy/Immunology: neg Neurological: neg Psychiatric: neg Sleep : neg   ALLERGIES: Allergies  Allergen Reactions  . Codeine   . Penicillins     HOME MEDICATIONS: Outpatient Prescriptions Prior to Visit  Medication Sig Dispense Refill  . amLODipine (NORVASC) 10 MG tablet Take 1 tablet by mouth daily.    . CRESTOR 20 MG tablet Take 1 tablet  by mouth daily.    . fentaNYL (DURAGESIC - DOSED MCG/HR) 12 MCG/HR Place 1 patch (12.5 mcg total) onto the skin every 3 (three) days. Patient has drug agreement 10 patch 0  . gabapentin (NEURONTIN) 100 MG capsule Take 3 capsules (300 mg total) by mouth at bedtime. Takes one at lunch then 2 at bedtime 90 capsule 11  . lisinopril (PRINIVIL,ZESTRIL) 10 MG tablet Take 10 mg by mouth daily.  0  . meloxicam (MOBIC) 7.5 MG tablet Take 1 tablet by mouth 2 (two) times daily.    Marland Kitchen omeprazole (PRILOSEC) 40 MG capsule Take 1 capsule by mouth daily.    . furosemide (LASIX) 20 MG tablet Take 1 tablet by mouth daily.     No facility-administered medications prior to visit.    PAST MEDICAL HISTORY: Past Medical History  Diagnosis Date  . Hypertension   . Back pain   . Depression   . Spinal stenosis, lumbar region, without neurogenic claudication     PAST SURGICAL HISTORY: Past Surgical History  Procedure Laterality Date  . Vaginal hysterectomy  1983    FAMILY HISTORY: Family History  Problem Relation Age of Onset  . Breast cancer Mother   . Diabetes Mother   . Fibromyalgia Mother   . Lung cancer Father   . Stroke Brother     SOCIAL HISTORY: Social History   Social History  . Marital Status: Single    Spouse Name: N/A  . Number of Children: 2  . Years of Education: HS   Occupational History  .  [employer]   Social History Main Topics  . Smoking status: Former Smoker    Quit date:  04/03/1999  . Smokeless tobacco: Never Used  . Alcohol Use: 0.6 oz/week    1 Glasses of wine per week     Comment: oc Holidays  . Drug Use: No  . Sexual Activity: Not on file   Other Topics Concern  . Not on file   Social History Narrative   Pt lives at home with her four grandchildren. Patient is single.   Caffeine Use- 2 cups of tea daily.   Right handed.     PHYSICAL EXAM  Filed Vitals:   07/07/15 1526  BP: 133/73  Pulse: 60  Height: 5\' 3"  (1.6 m)  Weight: 144 lb 9.6  oz (65.59 kg)   Body mass index is 25.62 kg/(m^2). Generalized: In no acute distress  Neck: Supple, no carotid bruits  Cardiac: Regular rate rhythm  Musculoskeletal: No deformity  Neurological examination  Mentation: Alert oriented to time, place, history taking, and causual conversation  Cranial nerve II-XII: Pupils were equal round reactive to light extraocular movements were full, visual field were full on confrontational test. facial sensation and strength were normal. hearing was intact to finger rubbing bilaterally. Uvula tongue midline. head turning and shoulder shrug and were normal and symmetric.Tongue protrusion into cheek strength was normal.  Motor: normal tone, bulk and strength. No focal weakness Sensory: Intact to fine touch, pinprick, preserved vibratory sensation, and proprioception at toes.  Coordination: Normal finger to nose, heel-to-shin bilaterally  Gait: Rising up from seated position without assistance, wide based stance, moderate stride, good arm swing, smooth turning, able to perform tiptoe, and heel walking without difficulty. Tandem is steady. No assistive device  Romberg signs: Negative  Deep tendon reflexes: Brachioradialis 2/2, biceps 2/2, triceps 2/2, patellar 2/2, Achilles trace, plantar responses were flexor bilaterally.  DIAGNOSTIC DATA (LABS, IMAGING, TESTING) -  ASSESSMENT AND PLAN 65 y.o. year old female a past medical history of spinal stenosis lumbar region here to follow-up. She continues to work full-time. Her pain is controlled with fentanyl and gabapentin.   Continue fentanyl just received refill  Continue gabapentin at current dose Refilled Drug screen done in Oct 2016 will not repeat today. No lifting over 20 pounds Follow-up in 6-8 months Nilda RiggsNancy Carolyn Kenza Munar, [facility] Hospital[facility]NP, [facility], APRN  [facility] [address], [address] [place], [place] [address] [phone]

## 2015-07-07 NOTE — Patient Instructions (Signed)
Continue fentanyl just received refill  Continue gabapentin at current dose will refill No lifting over 20 pounds Follow-up in 6-8 months

## 2015-07-07 NOTE — Progress Notes (Signed)
I have reviewed and agreed above plan. 

## 2015-07-12 ENCOUNTER — Ambulatory Visit: Payer: BC Managed Care – PPO | Admitting: Nurse Practitioner

## 2015-07-25 ENCOUNTER — Telehealth: Payer: Self-pay | Admitting: Nurse Practitioner

## 2015-07-25 ENCOUNTER — Telehealth: Payer: Self-pay | Admitting: *Deleted

## 2015-07-25 MED ORDER — FENTANYL 12 MCG/HR TD PT72: 12.5000 ug | MEDICATED_PATCH | TRANSDERMAL | Status: DC

## 2015-07-25 NOTE — Telephone Encounter (Signed)
Yes i did

## 2015-07-25 NOTE — Telephone Encounter (Signed)
Called pt. rx fentanyl ready to be picked up in office. Bring valid photo ID. She verbalized understanding. Rx placed up front.

## 2015-07-25 NOTE — Telephone Encounter (Signed)
Pt request refill for fentaNYL (DURAGESIC - DOSED MCG/HR) 12 MCG/HR °

## 2015-08-06 ENCOUNTER — Other Ambulatory Visit: Payer: Self-pay | Admitting: Nurse Practitioner

## 2015-08-08 ENCOUNTER — Telehealth: Payer: Self-pay | Admitting: *Deleted

## 2015-08-08 NOTE — Telephone Encounter (Signed)
Pt returning Morgan Swanson phone call. Please call [phone]

## 2015-08-08 NOTE — Telephone Encounter (Signed)
The [facility] Spoke with Wetzel BjornstadShannon, [facility] pharmacist who stated patient needs to call [facility], [facility] pharmacist and request he call [facility] and [facility] prescription there. Attempted to reach patient; left VM requesting her to call back to get her refill request instructions.  Left name, number.

## 2015-08-08 NOTE — Telephone Encounter (Signed)
Patient returned call and stated [facility] Informed her [facility]07[facility]15. She stated that was where she wanted to get her medication, so advised she should be able to with no problem. [facility] [address] [facility] her [facility]. She verbalized understanding of call.

## 2015-08-23 ENCOUNTER — Other Ambulatory Visit: Payer: Self-pay | Admitting: Neurology

## 2015-08-23 MED ORDER — FENTANYL 12 MCG/HR TD PT72: 12.5000 ug | MEDICATED_PATCH | TRANSDERMAL | Status: DC

## 2015-08-23 NOTE — Telephone Encounter (Signed)
Patient requesting refill of  fentaNYL (DURAGESIC - DOSED MCG/HR) 12 MCG/HR. ° ° °

## 2015-08-23 NOTE — Telephone Encounter (Signed)
1st rx. inadvertently printed in RAS name.  Reprinted with YY name/fim

## 2015-08-23 NOTE — Telephone Encounter (Signed)
Fentanyl rx placed up front for pick up. 

## 2015-08-23 NOTE — Telephone Encounter (Signed)
Rx. awaiting YY sig/fim 

## 2015-09-05 ENCOUNTER — Encounter: Payer: Self-pay | Admitting: Nurse Practitioner

## 2015-09-21 ENCOUNTER — Other Ambulatory Visit: Payer: Self-pay | Admitting: *Deleted

## 2015-09-21 ENCOUNTER — Telehealth: Payer: Self-pay | Admitting: Neurology

## 2015-09-21 MED ORDER — FENTANYL 12 MCG/HR TD PT72: 12.5000 ug | MEDICATED_PATCH | TRANSDERMAL | Status: DC

## 2015-09-21 NOTE — Telephone Encounter (Signed)
Patient requesting refill of fentaNYL (DURAGESIC - DOSED MCG/HR) 12 MCG/HR °Pharmacy: pick up ° °

## 2015-09-21 NOTE — Telephone Encounter (Signed)
Rx postdated, printed, signed and placed up front for pick up.  

## 2015-10-19 ENCOUNTER — Telehealth: Payer: Self-pay | Admitting: Neurology

## 2015-10-19 ENCOUNTER — Other Ambulatory Visit: Payer: Self-pay | Admitting: *Deleted

## 2015-10-19 MED ORDER — FENTANYL 12 MCG/HR TD PT72: 12.5000 ug | MEDICATED_PATCH | TRANSDERMAL | Status: DC

## 2015-10-19 NOTE — Telephone Encounter (Signed)
Rx signed and placed up front for pickup. 

## 2015-10-19 NOTE — Telephone Encounter (Signed)
Patient called to request refill of fentaNYL (DURAGESIC - DOSED MCG/HR) 12 MCG/HR

## 2015-10-19 NOTE — Telephone Encounter (Signed)
Rx printed for Dr. Epimenio FootSater to sign in Dr. Zannie CoveYan's absence.

## 2015-11-15 ENCOUNTER — Encounter: Payer: Self-pay | Admitting: Gastroenterology

## 2015-11-17 ENCOUNTER — Telehealth: Payer: Self-pay | Admitting: Neurology

## 2015-11-17 ENCOUNTER — Other Ambulatory Visit: Payer: Self-pay | Admitting: *Deleted

## 2015-11-17 MED ORDER — FENTANYL 12 MCG/HR TD PT72: 12.5000 ug | MEDICATED_PATCH | TRANSDERMAL | 0 refills | Status: DC

## 2015-11-17 NOTE — Telephone Encounter (Signed)
Patient requesting refill of fentaNYL (DURAGESIC - DOSED MCG/HR) 12 MCG/HR  Pt is aware the clinic closes at 12 on Friday

## 2015-11-17 NOTE — Telephone Encounter (Signed)
Rx post-dated, signed and placed up front for pick up.

## 2015-12-15 ENCOUNTER — Telehealth: Payer: Self-pay | Admitting: Neurology

## 2015-12-15 ENCOUNTER — Other Ambulatory Visit: Payer: Self-pay | Admitting: *Deleted

## 2015-12-15 MED ORDER — FENTANYL 12 MCG/HR TD PT72: 12.5000 ug | MEDICATED_PATCH | TRANSDERMAL | 0 refills | Status: DC

## 2015-12-15 NOTE — Telephone Encounter (Signed)
Refill appropriate - rx postdated, printed, signed and placed up front for pick up. 

## 2015-12-15 NOTE — Telephone Encounter (Signed)
Pt request refill for fentaNYL (DURAGESIC - DOSED MCG/HR) 12 MCG/HR °

## 2016-01-09 ENCOUNTER — Encounter: Payer: Self-pay | Admitting: Nurse Practitioner

## 2016-01-09 ENCOUNTER — Ambulatory Visit (INDEPENDENT_AMBULATORY_CARE_PROVIDER_SITE_OTHER): Payer: BC Managed Care – PPO | Admitting: Nurse Practitioner

## 2016-01-09 ENCOUNTER — Encounter: Payer: Self-pay | Admitting: *Deleted

## 2016-01-09 VITALS — BP 125/69 | HR 66 | Ht 63.0 in | Wt 144.8 lb

## 2016-01-09 DIAGNOSIS — G544 Lumbosacral root disorders, not elsewhere classified: Secondary | ICD-10-CM | POA: Diagnosis not present

## 2016-01-09 DIAGNOSIS — M48061 Spinal stenosis, lumbar region without neurogenic claudication: Secondary | ICD-10-CM | POA: Diagnosis not present

## 2016-01-09 NOTE — Patient Instructions (Signed)
Continue fentanyl just received refill  Continue gabapentin at current dose  Drug screen today per narcotic agreement No lifting over 20 pounds, letter to employer Follow-up in 6-8 months, next with Dr. Terrace ArabiaYan

## 2016-01-09 NOTE — Progress Notes (Signed)
[facility]  PATIENT: Morgan Swanson DOB: 03/30/1951   REASON FOR VISIT follow-up for spinal stenosis lumbar region:  HISTORY FROM: Patient    HISTORY OF PRESENT ILLNESS:Morgan Swanson is a 65 years old right-handed African American female, returns to followup for her low back pain, lumbar stenosis,  She had a past medical history of hypertension, hyperlipidemia.   She presented for low back pain since 2008, her low back pain radiating to her right lower extremity, involving right plantar foot, she denies significant weakness, no gait difficulty, she has been treated with fentanyl patch5212mcg/q72 hours, which has been very helpful, she only has minimal discomfort. She denies bowel or bladder incontinence. Most recent MRI lumbar was in 11/2006, which showed grade 1 anterolisthesis L4-5 with moderate to severe facet hypertrophy and moderate to severe central canal stenosis, moderate to severe right foraminal narrowing, right greater than left at L4-5. Patient has a signed narcotic agreement with this practice. She has been doing her exercises, HEP. She is also taking her gabapentin. She was recently placed trazodone for sleep with good benefit. She is raising her 4 grandchildren. She continues to work full-time. Her work restrictions are no lifting over 20 pounds She returns for reevaluation.      REVIEW OF SYSTEMS: Full 14 system review of systems performed and notable only for those listed, all others are neg:  Constitutional: neg  Cardiovascular: neg Ear/Nose/Throat: neg  Skin: neg Eyes: neg Respiratory: neg Gastroitestinal: neg  Hematology/Lymphatic: neg  Endocrine: neg Musculoskeletal:neg Allergy/Immunology: neg Neurological: Numbness Psychiatric: neg Sleep : neg   ALLERGIES: Allergies  Allergen Reactions  . Codeine   . Penicillins     HOME MEDICATIONS: Outpatient Medications Prior to Visit  Medication Sig Dispense Refill  . amLODipine (NORVASC)  10 MG tablet Take 1 tablet by mouth daily.    . CRESTOR 20 MG tablet Take 1 tablet by mouth daily.    . fentaNYL (DURAGESIC - DOSED MCG/HR) 12 MCG/HR Place 1 patch (12.5 mcg total) onto the skin every 3 (three) days. Patient has drug agreement 10 patch 0  . gabapentin (NEURONTIN) 100 MG capsule Take 3 capsules (300 mg total) by mouth at bedtime. 90 capsule 11  . lisinopril (PRINIVIL,ZESTRIL) 10 MG tablet Take 10 mg by mouth daily.  0  . meloxicam (MOBIC) 7.5 MG tablet Take 1 tablet by mouth 2 (two) times daily.    Marland Kitchen. omeprazole (PRILOSEC) 40 MG capsule Take 1 capsule by mouth daily.    . traZODone (DESYREL) 50 MG tablet Take 50 mg by mouth. 1/4 to 1/2 tablet at bedtime per pt  0  . triamterene-hydrochlorothiazide (MAXZIDE-25) 37.5-25 MG tablet Take 0.5 tablets by mouth daily.  0   No facility-administered medications prior to visit.     PAST MEDICAL HISTORY: Past Medical History:  Diagnosis Date  . Back pain   . Depression   . Hypertension   . Spinal stenosis, lumbar region, without neurogenic claudication     PAST SURGICAL HISTORY: Past Surgical History:  Procedure Laterality Date  . VAGINAL HYSTERECTOMY  1983    FAMILY HISTORY: Family History  Problem Relation Age of Onset  . Breast cancer Mother   . Diabetes Mother   . Fibromyalgia Mother   . Lung cancer Father   . Stroke Brother     SOCIAL HISTORY: Social History   Social History  . Marital status: Single    Spouse name: N/A  . Number of children: 2  . Years of education:  HS   Occupational History  .  [employer]   Social History Main Topics  . Smoking status: Former Smoker    Quit date: 04/03/1999  . Smokeless tobacco: Never Used  . Alcohol use 0.6 oz/week    1 Glasses of wine per week     Comment: oc Holidays  . Drug use: No  . Sexual activity: Not on file   Other Topics Concern  . Not on file   Social History Narrative   Pt lives at home with her four grandchildren. Patient is single.    Caffeine Use- 2 cups of tea daily.   Right handed.     PHYSICAL EXAM  Vitals:   01/09/16 1536  BP: 125/69  Pulse: 66  Weight: 144 lb 12.8 oz (65.7 kg)  Height: 5\' 3"  (1.6 m)   Body mass index is 25.65 kg/m. Generalized: In no acute distress  Neck: Supple, no carotid bruits  Cardiac: Regular rate rhythm  Musculoskeletal: No deformity  Neurological examination  Mentation: Alert oriented to time, place, history taking, and causual conversation . Denies depression Cranial nerve II-XII: Pupils were equal round reactive to light extraocular movements were full, visual field were full on confrontational test. facial sensation and strength were normal. hearing was intact to finger rubbing bilaterally. Uvula tongue midline. head turning and shoulder shrug and were normal and symmetric.Tongue protrusion into cheek strength was normal.  Motor: normal tone, bulk and strength. No focal weakness Sensory: Intact to fine touch, pinprick, preserved vibratory sensation, and proprioception at toes.  Coordination: Normal finger to nose, heel-to-shin bilaterally  Gait: Rising up from seated position without assistance, wide based stance, moderate stride, good arm swing, smooth turning, able to perform tiptoe, and heel walking without difficulty. Tandem is steady. No assistive device  Romberg signs: Negative  Deep tendon reflexes: Brachioradialis 2/2, biceps 2/2, triceps 2/2, patellar 2/2, Achilles trace, plantar responses were flexor bilaterally.  DIAGNOSTIC DATA (LABS, IMAGING, TESTING) -  ASSESSMENT AND PLAN 65 y.o. year old female a past medical history of spinal stenosis lumbar region here to follow-up. She continues to work full-time. Her pain is controlled with fentanyl and gabapentin.   PLAN: Continue fentanyl just received refill  Continue gabapentin at current dose, does not need refills  Drug screen today per narcotic agreement No lifting over 20 pounds, letter to  employer Follow-up in 6-8 months, next with Dr. Carmelina Noun, [facility], [facility], APRN  [facility] [address] [place], [place] [address] [phone]

## 2016-01-12 ENCOUNTER — Telehealth: Payer: Self-pay | Admitting: *Deleted

## 2016-01-12 NOTE — Telephone Encounter (Signed)
I called and LMVM on mobile for pt to come back in due to needing more urine for the drug screen.  Gave hours, and come hydrated.  Pt to call back if questions.

## 2016-01-13 ENCOUNTER — Other Ambulatory Visit: Payer: Self-pay | Admitting: Nurse Practitioner

## 2016-01-13 MED ORDER — FENTANYL 12 MCG/HR TD PT72: 12.5000 ug | MEDICATED_PATCH | TRANSDERMAL | 0 refills | Status: DC

## 2016-01-13 NOTE — Telephone Encounter (Signed)
Second message left about needing more urine for her drug screen.

## 2016-01-13 NOTE — Telephone Encounter (Signed)
Patient called to request refill of fentaNYL (DURAGESIC - DOSED MCG/HR) 12 MCG/HR °

## 2016-01-16 ENCOUNTER — Other Ambulatory Visit (INDEPENDENT_AMBULATORY_CARE_PROVIDER_SITE_OTHER): Payer: Self-pay

## 2016-01-16 ENCOUNTER — Other Ambulatory Visit: Payer: Self-pay | Admitting: *Deleted

## 2016-01-16 DIAGNOSIS — Z0289 Encounter for other administrative examinations: Secondary | ICD-10-CM

## 2016-01-16 DIAGNOSIS — Z79899 Other long term (current) drug therapy: Secondary | ICD-10-CM

## 2016-01-16 NOTE — Addendum Note (Signed)
Addended byHermenia Fiscal: YOUNG, SANDRA on: 01/16/2016 04:41 PM   Modules accepted: Orders

## 2016-01-16 NOTE — Telephone Encounter (Signed)
LMVM for pt to come by and pick up prescription for fentanyl patches, but also need urine sample as did not get enough the last time.

## 2016-01-17 LAB — 733690 12+OXYCODONE+CRT-SCR
Amphetamine Screen, Ur: NEGATIVE ng/mL
Barbiturate Screen, Ur: NEGATIVE ng/mL
Benzodiazepine Screen, Urine: NEGATIVE ng/mL
COCAINE(METAB.) SCREEN, URINE: NEGATIVE ng/mL
CREATININE(CRT), U: 313.2 mg/dL — AB (ref 20.0–300.0)
Cannabinoids Ur Ql Scn: NEGATIVE ng/mL
FENTANYL, URINE: POSITIVE pg/mL
METHADONE SCREEN, URINE: NEGATIVE ng/mL
Meperidine Screen, Urine: NEGATIVE ng/mL
Opiate Scrn, Ur: NEGATIVE ng/mL
Oxycodone+Oxymorphone Ur Ql Scn: NEGATIVE ng/mL
PCP SCRN UR: NEGATIVE ng/mL
PH UR, DRUG SCRN: 5.6 (ref 4.5–8.9)
Propoxyphene, Screen: NEGATIVE ng/mL
TRAMADOL UR QL SCN: NEGATIVE ng/mL

## 2016-01-18 ENCOUNTER — Telehealth: Payer: Self-pay | Admitting: *Deleted

## 2016-01-18 NOTE — Telephone Encounter (Signed)
LMVM (home) ok per DPR, that her drug screen was ok.  Pt is to call back if questions.

## 2016-01-18 NOTE — Telephone Encounter (Signed)
-----   Message from Nilda RiggsNancy Carolyn Martin, NP sent at 01/17/2016 11:44 AM EDT ----- Drug screen ok please call the patient

## 2016-02-06 ENCOUNTER — Ambulatory Visit (AMBULATORY_SURGERY_CENTER): Payer: Self-pay | Admitting: *Deleted

## 2016-02-06 VITALS — Ht 63.0 in | Wt 145.0 lb

## 2016-02-06 DIAGNOSIS — Z1211 Encounter for screening for malignant neoplasm of colon: Secondary | ICD-10-CM

## 2016-02-06 MED ORDER — NA SULFATE-K SULFATE-MG SULF 17.5-3.13-1.6 GM/177ML PO SOLN: 1.0000 | Freq: Once | ORAL | 0 refills | Status: AC

## 2016-02-06 NOTE — Progress Notes (Signed)
No egg or soy allergy known to patient  No issues with past sedation with any surgeries  or procedures, no intubation problems  No diet pills per patient No home 02 use per patient  No blood thinners per patient  Pt states  issues with constipation very rare that is corrected with diet changes  No A fib or A flutter   emmi video to e mail

## 2016-02-14 ENCOUNTER — Other Ambulatory Visit: Payer: Self-pay | Admitting: *Deleted

## 2016-02-14 ENCOUNTER — Telehealth: Payer: Self-pay | Admitting: Neurology

## 2016-02-14 MED ORDER — FENTANYL 12 MCG/HR TD PT72: 12.5000 ug | MEDICATED_PATCH | TRANSDERMAL | 0 refills | Status: DC

## 2016-02-14 NOTE — Telephone Encounter (Signed)
Refill postdated, printed, signed and placed up front for pick up.

## 2016-02-14 NOTE — Telephone Encounter (Signed)
Patient requesting refill of fentaNYL (DURAGESIC - DOSED MCG/HR) 12 MCG/HR Pharmacy: pick up

## 2016-02-20 ENCOUNTER — Encounter: Payer: Self-pay | Admitting: Gastroenterology

## 2016-03-15 ENCOUNTER — Telehealth: Payer: Self-pay | Admitting: Neurology

## 2016-03-15 ENCOUNTER — Other Ambulatory Visit: Payer: Self-pay | Admitting: *Deleted

## 2016-03-15 MED ORDER — FENTANYL 12 MCG/HR TD PT72: 12.5000 ug | MEDICATED_PATCH | TRANSDERMAL | 0 refills | Status: DC

## 2016-03-15 NOTE — Telephone Encounter (Signed)
Pt request refill for fentaNYL (DURAGESIC - DOSED MCG/HR) 12 MCG/HR . Pt is aware the clinic closes at noon tomorrow. York SpanielSaid she will pick it up on Monday

## 2016-03-15 NOTE — Telephone Encounter (Signed)
Rx postdated, printed, signed and placed up front for pick up.  

## 2016-03-21 ENCOUNTER — Ambulatory Visit (AMBULATORY_SURGERY_CENTER): Payer: BC Managed Care – PPO | Admitting: Gastroenterology

## 2016-03-21 ENCOUNTER — Encounter: Payer: Self-pay | Admitting: Gastroenterology

## 2016-03-21 VITALS — BP 139/78 | HR 64 | Temp 98.4°F | Resp 17 | Ht 63.0 in | Wt 145.0 lb

## 2016-03-21 DIAGNOSIS — Z1211 Encounter for screening for malignant neoplasm of colon: Secondary | ICD-10-CM

## 2016-03-21 DIAGNOSIS — Z1212 Encounter for screening for malignant neoplasm of rectum: Secondary | ICD-10-CM | POA: Diagnosis not present

## 2016-03-21 DIAGNOSIS — I10 Essential (primary) hypertension: Secondary | ICD-10-CM | POA: Diagnosis not present

## 2016-03-21 MED ORDER — SODIUM CHLORIDE 0.9 % IV SOLN: 500.0000 mL | INTRAVENOUS | Status: DC

## 2016-03-21 NOTE — Op Note (Signed)
[facility] Patient Name: Morgan Swanson Procedure Date: 03/21/2016 2:48 PM MRN: [id number] Endoscopist: Napoleon Form , MD Age: 65 Referring MD:  Date of Birth: 1950-10-25 Gender: Female Account #: [id number] Procedure:                Colonoscopy Indications:              Screening for malignant neoplasm in the rectum,                            This is the patient's first colonoscopy Medicines:                Monitored Anesthesia Care Procedure:                Pre-Anesthesia Assessment:                           - Prior to the procedure, a History and Physical                            was performed, and patient medications and                            allergies were reviewed. The patient's tolerance of                            previous anesthesia was also reviewed. The risks                            and benefits of the procedure and the sedation                            options and risks were discussed with the patient.                            All questions were answered, and informed consent                            was obtained. Prior Anticoagulants: The patient has                            taken no previous anticoagulant or antiplatelet                            agents. ASA Grade Assessment: II - A patient with                            mild systemic disease. After reviewing the risks                            and benefits, the patient was deemed in                            satisfactory condition to undergo the procedure.  After obtaining informed consent, the colonoscope                            was passed under direct vision. Throughout the                            procedure, the patient's blood pressure, pulse, and                            oxygen saturations were monitored continuously. The                            Model PCF-H190L [id number](SN#[id number]) scope was introduced                            through the  anus and advanced to the the cecum,                            identified by appendiceal orifice and ileocecal                            valve. The colonoscopy was performed without                            difficulty. The patient tolerated the procedure                            well. The quality of the bowel preparation was                            fair. The ileocecal valve, appendiceal orifice, and                            rectum were photographed. Scope In: 2:51:05 PM Scope Out: 3:15:36 PM Scope Withdrawal Time: 0 hours 18 minutes 31 seconds  Total Procedure Duration: 0 hours 24 minutes 31 seconds  Findings:                 The perianal and digital rectal examinations were                            normal.                           A small amount of stool was found in the entire                            colon, making visualization difficult. Lavage of                            the area was performed using a large amount,                            resulting in clearance with fair visualization.  Non-bleeding internal hemorrhoids were found during                            retroflexion. The hemorrhoids were small. Complications:            No immediate complications. Estimated Blood Loss:     Estimated blood loss: none. Impression:               - Preparation of the colon was fair.                           - Stool in the entire examined colon.                           - Non-bleeding internal hemorrhoids.                           - No specimens collected. Recommendation:           - Patient has a contact number available for                            emergencies. The signs and symptoms of potential                            delayed complications were discussed with the                            patient. Return to normal activities tomorrow.                            Written discharge instructions were provided to the                             patient.                           - Resume previous diet.                           - Continue present medications.                           - Repeat colonoscopy in 1 year because the bowel                            preparation was suboptimal.                           - For future colonoscopy the patient will require                            an extended preparation, avoid fiber for 7-10 days.                            If there are any questions, please contact the  gastroenterologist. Napoleon FormKavitha V. Hebe [name], MD 03/21/2016 3:21:50 PM This report has been signed electronically.

## 2016-03-21 NOTE — Patient Instructions (Signed)
YOU HAD AN ENDOSCOPIC PROCEDURE TODAY AT THE [facility]:   Refer to the procedure report that was given to you for any specific questions about what was found during the examination.  If the procedure report does not answer your questions, please call your gastroenterologist to clarify.  If you requested that your care partner not be given the details of your procedure findings, then the procedure report has been included in a sealed envelope for you to review at your convenience later.  YOU SHOULD EXPECT: Some feelings of bloating in the abdomen. Passage of more gas than usual.  Walking can help get rid of the air that was put into your GI tract during the procedure and reduce the bloating. If you had a lower endoscopy (such as a colonoscopy or flexible sigmoidoscopy) you may notice spotting of blood in your stool or on the toilet paper. If you underwent a bowel prep for your procedure, you may not have a normal bowel movement for a few days.  Please Note:  You might notice some irritation and congestion in your nose or some drainage.  This is from the oxygen used during your procedure.  There is no need for concern and it should clear up in a day or so.  SYMPTOMS TO REPORT IMMEDIATELY:   Following lower endoscopy (colonoscopy or flexible sigmoidoscopy):  Excessive amounts of blood in the stool  Significant tenderness or worsening of abdominal pains  Swelling of the abdomen that is new, acute  Fever of 100F or higher  For urgent or emergent issues, a gastroenterologist can be reached at any hour by calling [phone].   DIET:  We do recommend a small meal at first, but then you may proceed to your regular diet.  Drink plenty of fluids but you should avoid alcoholic beverages for 24 hours.  ACTIVITY:  You should plan to take it easy for the rest of today and you should NOT DRIVE or use heavy machinery until tomorrow (because of the sedation medicines used during the test).     FOLLOW UP: Our staff will call the number listed on your records the next business day following your procedure to check on you and address any questions or concerns that you may have regarding the information given to you following your procedure. If we do not reach you, we will leave a message.  However, if you are feeling well and you are not experiencing any problems, there is no need to return our call.  We will assume that you have returned to your regular daily activities without incident.  If any biopsies were taken you will be contacted by phone or by letter within the next 1-3 weeks.  Please call us at [phone] if you have not heard about the biopsies in 3 weeks.    SIGNATURES/CONFIDENTIALITY: You and/or your care partner have signed paperwork which will be entered into your electronic medical record.  These signatures attest to the fact that that the information above on your After Visit Summary has been reviewed and is understood.  Full responsibility of the confidentiality of this discharge information lies with you and/or your care-partner.  Hemorrhoids (handout given)

## 2016-03-21 NOTE — Progress Notes (Signed)
A/ox3 pleased with MAC, report to Michelle RN 

## 2016-03-22 ENCOUNTER — Telehealth: Payer: Self-pay

## 2016-03-22 NOTE — Telephone Encounter (Signed)
  Follow up Call-  Call back number 03/21/2016  Post procedure Call Back phone  # [phone]  Permission to leave phone message Yes  Some recent data might be hidden     Patient questions:  Do you have a fever, pain , or abdominal swelling? No. Pain Score  0 *  Have you tolerated food without any problems? Yes.    Have you been able to return to your normal activities? Yes.    Do you have any questions about your discharge instructions: Diet   No. Medications  No. Follow up visit  No.  Do you have questions or concerns about your Care? No.  Actions: * If pain score is 4 or above: No action needed, pain <4.

## 2016-04-16 ENCOUNTER — Telehealth: Payer: Self-pay | Admitting: Neurology

## 2016-04-16 ENCOUNTER — Other Ambulatory Visit: Payer: Self-pay | Admitting: *Deleted

## 2016-04-16 MED ORDER — FENTANYL 12 MCG/HR TD PT72: 12.5000 ug | MEDICATED_PATCH | TRANSDERMAL | 0 refills | Status: DC

## 2016-04-16 NOTE — Telephone Encounter (Signed)
Patient requesting refill for fentaNYL (DURAGESIC - DOSED MCG/HR) 12 MCG/HR.

## 2016-04-16 NOTE — Telephone Encounter (Signed)
Narcotic registry reviewed - refill appropriate.  Printed, signed and placed up front for pick up. 

## 2016-05-16 ENCOUNTER — Other Ambulatory Visit: Payer: Self-pay | Admitting: *Deleted

## 2016-05-16 ENCOUNTER — Telehealth: Payer: Self-pay | Admitting: Neurology

## 2016-05-16 MED ORDER — FENTANYL 12 MCG/HR TD PT72: 12.5000 ug | MEDICATED_PATCH | TRANSDERMAL | 0 refills | Status: DC

## 2016-05-16 NOTE — Telephone Encounter (Signed)
Refill appropriate and due ([place] Narcotic registry reviewed) - printed, signed and placed up front for pick up.

## 2016-05-16 NOTE — Telephone Encounter (Signed)
Pt request refill for fentaNYL (DURAGESIC - DOSED MCG/HR) 12 MCG/HR °

## 2016-06-12 ENCOUNTER — Other Ambulatory Visit: Payer: Self-pay | Admitting: *Deleted

## 2016-06-12 ENCOUNTER — Telehealth: Payer: Self-pay | Admitting: Neurology

## 2016-06-12 MED ORDER — FENTANYL 12 MCG/HR TD PT72: 12.5000 ug | MEDICATED_PATCH | TRANSDERMAL | 0 refills | Status: DC

## 2016-06-12 NOTE — Telephone Encounter (Signed)
[place] narcotic registry checked without concern.  Rx post-dated, printed, signed and placed up front for pick up.

## 2016-06-12 NOTE — Telephone Encounter (Signed)
Patient requesting refill of  fentaNYL (DURAGESIC - DOSED MCG/HR) 12 MCG/HR. ° ° °

## 2016-07-12 ENCOUNTER — Telehealth: Payer: Self-pay | Admitting: Neurology

## 2016-07-12 ENCOUNTER — Other Ambulatory Visit: Payer: Self-pay | Admitting: *Deleted

## 2016-07-12 MED ORDER — FENTANYL 12 MCG/HR TD PT72: 12.5000 ug | MEDICATED_PATCH | TRANSDERMAL | 0 refills | Status: DC

## 2016-07-12 NOTE — Telephone Encounter (Signed)
[place] narcotic registry checked - no problems noted.  Refill post-dated, printed, signed and placed up front for pick up.

## 2016-07-12 NOTE — Telephone Encounter (Signed)
Patient requesting refill of  fentaNYL (DURAGESIC - DOSED MCG/HR) 12 MCG/HR. ° ° °

## 2016-07-17 ENCOUNTER — Ambulatory Visit (INDEPENDENT_AMBULATORY_CARE_PROVIDER_SITE_OTHER): Payer: BC Managed Care – PPO

## 2016-07-17 ENCOUNTER — Ambulatory Visit (INDEPENDENT_AMBULATORY_CARE_PROVIDER_SITE_OTHER): Payer: BC Managed Care – PPO | Admitting: Orthopaedic Surgery

## 2016-07-17 ENCOUNTER — Encounter (INDEPENDENT_AMBULATORY_CARE_PROVIDER_SITE_OTHER): Payer: Self-pay | Admitting: Orthopaedic Surgery

## 2016-07-17 DIAGNOSIS — M79641 Pain in right hand: Secondary | ICD-10-CM | POA: Diagnosis not present

## 2016-07-17 DIAGNOSIS — M79642 Pain in left hand: Secondary | ICD-10-CM | POA: Diagnosis not present

## 2016-07-17 MED ORDER — TRAMADOL HCL 50 MG PO TABS: 50.0000 mg | ORAL_TABLET | Freq: Two times a day (BID) | ORAL | 2 refills | Status: DC | PRN

## 2016-07-17 NOTE — Progress Notes (Signed)
Office Visit Note   Patient: Morgan Swanson  Date of Birth: Apr 18, 1950           MRN: [id number] Visit Date: 07/17/2016              Requested by: Gwenyth Bender, MD [address] [place], [place] [address] PCP: Zachery Dauer, FNP   Assessment & Plan: Visit Diagnoses:  1. 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 I did give her prescription for tramadol for breakthrough pain. Sample of Pennsaid was given today. Follow-up with me as needed. Questions encouraged and answered.  Follow-Up Instructions: Return if symptoms worsen or fail to improve.   Orders:  Orders Placed This Encounter  Procedures  . XR Hand Complete Left  . XR Hand Complete Right   Meds ordered this encounter  Medications  . traMADol (ULTRAM) 50 MG tablet    Sig: Take 1 tablet (50 mg total) by mouth every 12 (twelve) hours as needed.    Dispense:  30 tablet    Refill:  2      Procedures: No procedures performed   Clinical Data: No additional findings.   Subjective: Chief Complaint  Patient presents with  . Left Hand - Pain  . Right Hand - Pain    Patient is a very pleasant 66 year old female comes in with bilateral hand pain thickness in her fingers and hands. She works for [employer] use her hands quite a bit. She wears braces at times she does take meloxicam regularly and takes Aleve occasionally. This does help. She has had previous blood work which was negative for rheumatoid arthritis. She states that she mainly has stiffness in the morning and but does not have any numbness or burning.    Review of Systems  Constitutional: Negative.   HENT: Negative.   Eyes: Negative.   Respiratory: Negative.   Cardiovascular: Negative.   Endocrine: Negative.   Musculoskeletal: Negative.   Neurological: Negative.   Hematological: Negative.   Psychiatric/Behavioral: Negative.   All other systems reviewed and are  negative.    Objective: Vital Signs: There were no vitals taken for this visit.  Physical Exam  Constitutional: She is oriented to person, place, and time. She appears well-developed and well-nourished.  HENT:  Head: Normocephalic and atraumatic.  Eyes: EOM are normal.  Neck: Neck supple.  Pulmonary/Chest: Effort normal.  Abdominal: Soft.  Neurological: She is alert and oriented to person, place, and time.  Skin: Skin is warm. Capillary refill takes less than 2 seconds.  Psychiatric: She has a normal mood and affect. Her behavior is normal. Judgment and thought content normal.  Nursing note and vitals reviewed.   Ortho Exam Bilateral hand exam shows slightly enlarged MCP and PIP and DIP joints. She is good grip strength and range of motion. Negative carpal tunnel signs. Specialty Comments:  No specialty comments available.  Imaging: Xr Hand Complete Right  Result Date: 07/17/2016 No acute structural abnormalities.    PMFS History: Patient Active Problem List   Diagnosis Date Noted  . Lumbosacral root lesions 01/25/2014  . Spinal stenosis, lumbar region, without neurogenic claudication 07/21/2012  . Encounter for long-term (current) use of other medications 07/21/2012  . Lumbosacral root lesions, not elsewhere classified 07/21/2012   Past Medical History:  Diagnosis Date  . Arthritis    back   . Back pain   .  Cataract    small immature  . Depression   . GERD (gastroesophageal reflux disease)   . Hyperlipidemia   . Hypertension   . Spinal stenosis, lumbar region, without neurogenic claudication     Family History  Problem Relation Age of Onset  . Breast cancer Mother   . Diabetes Mother   . Fibromyalgia Mother   . Lung cancer Father   . Stroke Brother   . Colon cancer Neg Hx   . Colon polyps Neg Hx   . Esophageal cancer Neg Hx   . Rectal cancer Neg Hx   . Stomach cancer Neg Hx     Past Surgical History:  Procedure Laterality Date  . CHOLECYSTECTOMY     . HAND SURGERY     fatty tissue removed   . VAGINAL HYSTERECTOMY  1983   Social History   Occupational History  .  [employer]   Social History Main Topics  . Smoking status: Former Smoker    Quit date: 04/03/1999  . Smokeless tobacco: Never Used  . Alcohol use 0.6 oz/week    1 Glasses of wine per week     Comment: occ Holidays  . Drug use: No  . Sexual activity: Not on file

## 2016-07-20 ENCOUNTER — Other Ambulatory Visit: Payer: Self-pay | Admitting: Nurse Practitioner

## 2016-07-23 ENCOUNTER — Telehealth: Payer: Self-pay | Admitting: Cardiology

## 2016-07-23 NOTE — Telephone Encounter (Signed)
Received records from [facility] for appointment on 07/24/16 with Dr Herbie Baltimore.  Records put with Dr Elissa Hefty schedule for 07/24/16. lp

## 2016-07-24 ENCOUNTER — Ambulatory Visit: Payer: Medicare Other | Admitting: Cardiology

## 2016-07-30 ENCOUNTER — Telehealth (INDEPENDENT_AMBULATORY_CARE_PROVIDER_SITE_OTHER): Payer: Self-pay

## 2016-07-30 NOTE — Telephone Encounter (Signed)
yes

## 2016-07-30 NOTE — Telephone Encounter (Signed)
Patient would d like a Rx for Pennsaid.  CB# is [phone].

## 2016-07-30 NOTE — Telephone Encounter (Signed)
Is this okay?

## 2016-07-31 ENCOUNTER — Telehealth (INDEPENDENT_AMBULATORY_CARE_PROVIDER_SITE_OTHER): Payer: Self-pay | Admitting: Orthopaedic Surgery

## 2016-07-31 MED ORDER — DICLOFENAC SODIUM 2 % TD SOLN: TRANSDERMAL | 0 refills | Status: DC

## 2016-07-31 NOTE — Telephone Encounter (Signed)
Called pt no answer LMOM. I need to know what pharm she uses so I can send in Rx- pennsaid for her

## 2016-07-31 NOTE — Telephone Encounter (Signed)
Pt called back to state that she uses [facility] on [address] as her pharmacy.

## 2016-07-31 NOTE — Telephone Encounter (Signed)
SENT INTO PHARM 

## 2016-07-31 NOTE — Addendum Note (Signed)
Addended by: Albertina Parr on: 07/31/2016 01:50 PM   Modules accepted: Orders

## 2016-08-01 ENCOUNTER — Ambulatory Visit: Payer: Medicare Other | Admitting: Cardiology

## 2016-08-14 ENCOUNTER — Telehealth: Payer: Self-pay | Admitting: Neurology

## 2016-08-14 MED ORDER — FENTANYL 12 MCG/HR TD PT72: 12.5000 ug | MEDICATED_PATCH | TRANSDERMAL | 0 refills | Status: DC

## 2016-08-14 NOTE — Addendum Note (Signed)
Addended by: Lindell SparKIRKMAN, MICHELLE C on: 08/14/2016 03:29 PM   Modules accepted: Orders

## 2016-08-14 NOTE — Telephone Encounter (Signed)
[place] narcotic registry showed a prescription, on 07/18/16, for Tramadol 50mg , #15 x 2 from her orthopaedic physician, Dr. Gershon MusselXu Naiping.  She has a pending appt with Dr. Terrace ArabiaYan on 09/10/16 and she will need to keep this appt.  Dr. Terrace ArabiaYan will refill her Fentanyl this month and speak with her concerning the need to continue this medication at her follow up.  Rx printed, signed and placed up front for pick up.

## 2016-08-14 NOTE — Telephone Encounter (Signed)
Pt calling for refill of fentaNYL (DURAGESIC - DOSED MCG/HR) 12 MCG/HR

## 2016-08-31 HISTORY — PX: OTHER SURGICAL HISTORY: SHX169

## 2016-09-10 ENCOUNTER — Ambulatory Visit (INDEPENDENT_AMBULATORY_CARE_PROVIDER_SITE_OTHER): Payer: BC Managed Care – PPO | Admitting: Neurology

## 2016-09-10 ENCOUNTER — Encounter: Payer: Self-pay | Admitting: Neurology

## 2016-09-10 VITALS — BP 148/87 | HR 72 | Wt 143.0 lb

## 2016-09-10 DIAGNOSIS — G8929 Other chronic pain: Secondary | ICD-10-CM

## 2016-09-10 DIAGNOSIS — G544 Lumbosacral root disorders, not elsewhere classified: Secondary | ICD-10-CM | POA: Diagnosis not present

## 2016-09-10 DIAGNOSIS — M5440 Lumbago with sciatica, unspecified side: Secondary | ICD-10-CM

## 2016-09-10 MED ORDER — MELOXICAM 7.5 MG PO TABS: 7.5000 mg | ORAL_TABLET | Freq: Two times a day (BID) | ORAL | 11 refills | Status: DC

## 2016-09-10 MED ORDER — FENTANYL 12 MCG/HR TD PT72: 12.5000 ug | MEDICATED_PATCH | TRANSDERMAL | 0 refills | Status: DC

## 2016-09-10 MED ORDER — GABAPENTIN 100 MG PO CAPS: 100.0000 mg | ORAL_CAPSULE | Freq: Three times a day (TID) | ORAL | 11 refills | Status: DC

## 2016-09-10 NOTE — Patient Instructions (Signed)
[facility]  [place]  [address]  [address] [place],  [address] Phone: [phone]   

## 2016-09-10 NOTE — Progress Notes (Addendum)
[facility]  PATIENT: Morgan Swanson DOB: 10/02/1950  HISTORY OF PRESENT ILLNESS:Morgan Swanson is a 66 years old right-handed African American female, returns to followup for her low back pain, lumbar stenosis,   She had a past medical history of hypertension, hyperlipidemia.     She presented for low back pain since 2008, her low back pain radiating to her right lower extremity, involving right plantar foot, she denies significant weakness, no gait difficulty, she has been treated with fentanyl patch9012mcg/q72 hours, which has been very helpful, she only has minimal discomfort. She denies bowel or bladder incontinence.   Most recent MRI lumbar was in 11/2006, which showed grade 1 anterolisthesis L4-5 with moderate to severe facet hypertrophy and moderate to severe central canal stenosis, moderate to severe right foraminal narrowing, right greater than left at L4-5.   Patient has a signed narcotic agreement with this practice. She has been doing her exercises, HEP. She is also taking her gabapentin. She was recently placed trazodone for sleep with good benefit. She is raising her 4 grandchildren. She continues to work full-time. Her work restrictions are no lifting over 20 pounds She returns for reevaluation.   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 Codeine   . Penicillins     HOME MEDICATIONS: Outpatient Medications Prior to Visit  Medication Sig Dispense Refill  . amLODipine (NORVASC) 10 MG tablet Take 1 tablet by mouth daily.    . CRESTOR 20 MG tablet Take 1 tablet by mouth daily.    . Diclofenac Sodium (PENNSAID) 2 % SOLN APPLY TO AFFECTED AREA 2-3 TIMES PRN 1 Bottle 0  . fentaNYL (DURAGESIC - DOSED MCG/HR) 12 MCG/HR Place 1 patch (12.5 mcg total) onto the skin every 3 (three) days. Patient has drug agreement 10 patch 0  . gabapentin (NEURONTIN) 100 MG capsule take 3 capsules by mouth at bedtime 90 capsule 11  . lisinopril (PRINIVIL,ZESTRIL) 10 MG tablet Take 10 mg by mouth daily.  0  . meloxicam (MOBIC) 7.5 MG tablet Take 1 tablet by mouth 2 (two) times daily.    Marland Kitchen. omeprazole (PRILOSEC) 40 MG capsule Take 1 capsule by mouth daily.    . traMADol (ULTRAM) 50 MG tablet Take 1 tablet (50 mg total) by mouth every 12 (twelve) hours as needed. 30 tablet 2  . traZODone (DESYREL) 50 MG tablet Take 50 mg by mouth. 1/4 to 1/2 tablet at bedtime per pt  0  . triamterene-hydrochlorothiazide (MAXZIDE-25) 37.5-25 MG tablet Take 0.5 tablets by mouth daily.  0   Facility-Administered Medications Prior to Visit  Medication Dose Route Frequency Provider Last Rate Last Dose  . 0.9 %  sodium chloride infusion  500 mL Intravenous Continuous Nandigam, Eleonore ChiquitoKavitha V, MD  PAST MEDICAL HISTORY: Past Medical History:  Diagnosis Date  . Arthritis    back   . Back pain   . Cataract    small immature  . Depression   . GERD (gastroesophageal reflux disease)   . Hyperlipidemia   . Hypertension   . Spinal stenosis, lumbar region, without neurogenic claudication     PAST SURGICAL HISTORY: Past Surgical History:  Procedure Laterality Date  . CHOLECYSTECTOMY    . HAND SURGERY     fatty tissue removed   . VAGINAL HYSTERECTOMY  1983     FAMILY HISTORY: Family History  Problem Relation Age of Onset  . Breast cancer Mother   . Diabetes Mother   . Fibromyalgia Mother   . Lung cancer Father   . Stroke Brother   . Colon cancer Neg Hx   . Colon polyps Neg Hx   . Esophageal cancer Neg Hx   . Rectal cancer Neg Hx   . Stomach cancer Neg Hx     SOCIAL HISTORY: Social History   Social History  . Marital status: Single    Spouse name: N/A  . Number of children: 2  . Years of education: HS   Occupational History  .  [employer]   Social History Main Topics  . Smoking status: Former Smoker    Quit date: 04/03/1999  . Smokeless tobacco: Never Used  . Alcohol use 0.6 oz/week    1 Glasses of wine per week     Comment: occ Holidays  . Drug use: No  . Sexual activity: Not on file   Other Topics Concern  . Not on file   Social History Narrative   Pt lives at home with her four grandchildren. Patient is single.   Caffeine Use- 2 cups of tea daily.   Right handed.    PHYSICAL EXAMNIATION:  Gen: NAD, conversant, well nourised, obese, well groomed                     Cardiovascular: Regular rate rhythm, no peripheral edema, warm, nontender. Eyes: Conjunctivae clear without exudates or hemorrhage Neck: Supple, no carotid bruits. Pulmonary: Clear to auscultation bilaterally   NEUROLOGICAL EXAM:  MENTAL STATUS: Speech:    Speech is normal; fluent and spontaneous with normal comprehension.  Cognition:     Orientation to time, place and person     Normal recent and remote memory     Normal Attention span and concentration     Normal Language, naming, repeating,spontaneous speech     Fund of knowledge   CRANIAL NERVES: CN II: Visual fields are full to confrontation. Fundoscopic exam is normal with sharp discs and no vascular changes. Pupils are round equal and briskly reactive to light. CN III, IV, VI: extraocular movement are normal. No ptosis. CN V: Facial sensation is intact to pinprick in  all 3 divisions bilaterally. Corneal responses are intact.  CN VII: Face is symmetric with normal eye closure and smile. CN VIII: Hearing is normal to rubbing fingers CN IX, X: Palate elevates symmetrically. Phonation is normal. CN XI: Head turning and shoulder shrug are intact CN XII: Tongue is midline with normal movements and no atrophy.  MOTOR: She has mild bilateral toe extension weakness, right worse than left,   REFLEXES: Reflexes are 2+ and symmetric at the biceps, triceps, knees, and absent at ankles. Plantar responses are flexor.  SENSORY: Intact to light touch, pinprick, positional and vibratory sensation are intact in fingers and toes.  COORDINATION: Rapid alternating movements and fine finger movements are intact. There is no dysmetria on finger-to-nose and heel-knee-shin.    GAIT/STANCE: Antalgic gait.   DIAGNOSTIC DATA (LABS, IMAGING, TESTING) -  ASSESSMENT AND PLAN 66 y.o. year old female Lumbar radiculopathy, worsening bilateral lower extremity paresthesia, mild weakness, chronic pain  Repeat MRI of lumbar  EMG nerve conduction study  Referred to neurosurgeon  Refill her medication fentanyl patch 12.5 mg 10 patch each months, as needed Mobic, gabapentin 100 mg 3 times a day  Levert Feinstein, M.D. Ph.D.  [facility] [address] [place], [place] [address] Phone: [phone] Fax:      [phone]  Addendum: I reviewed neurosurgeon evaluation by Dr. Mikal Plane on October 08 2016: Profound stenosis at L4-5 secondary to ligamentum flavum hypertrophy, with the cyst, facet hypertrophy, even worse facet hypertrophy L5-S1, but patient is not in significant pain, 6 out of 10, with movement Up to 9 Out Of 10, She Was Referred to Injection First,

## 2016-09-19 ENCOUNTER — Encounter: Payer: Self-pay | Admitting: Cardiology

## 2016-09-19 ENCOUNTER — Ambulatory Visit (INDEPENDENT_AMBULATORY_CARE_PROVIDER_SITE_OTHER): Payer: BC Managed Care – PPO | Admitting: Cardiology

## 2016-09-19 VITALS — BP 160/86 | HR 59 | Ht 63.0 in | Wt 142.4 lb

## 2016-09-19 DIAGNOSIS — R011 Cardiac murmur, unspecified: Secondary | ICD-10-CM | POA: Diagnosis not present

## 2016-09-19 DIAGNOSIS — I10 Essential (primary) hypertension: Secondary | ICD-10-CM

## 2016-09-19 DIAGNOSIS — R002 Palpitations: Secondary | ICD-10-CM | POA: Diagnosis not present

## 2016-09-19 NOTE — Progress Notes (Signed)
PCP: Morgan Dauer, FNP; Dr. Willey Swanson.  Clinic Note: Chief Complaint  Patient presents with  . New Evaluation    Heart fluttering    HPI: Morgan Swanson is a 66 y.o. female who is being seen today for the evaluation of heart fluttering at the request of Odem, Morgan Cree, FNP. She has a history of hypertension hyperglycemia/prediabetes She has chronic pain with lumbar radiculopathy and has signed a narcotic agreement with her neurology clinic. She has significant stress managing household with 4 grandchildren from she's had to take legal vessel.  Per PCPs note, she is back to work now and also exercising regularly  Recent Hospitalizations: None  Studies Personally Reviewed - (if available, images/films reviewed: From Epic Chart or Care Everywhere)  Transthoracic echo: October 2003: Normal LV size and function. EF 55-65%. Normal regional wall motion. No valvular lesions.  Interval History: Morgan Swanson presents here today to discuss a fluttering sensation in her chest that she is feeling now. Basically what she describes as having a quivering sensation in his upper chest that may last a few minutes at a time. Nothing more than 5 minutes. It feels like a "butterfly in her upper chest. Is not associated with any dyspnea or dizziness, just an unusual sensation. She usually notes this when she has been stressed, not when she is exerting herself. She has a significant amount social stress now because she is raising her teenage grandchildren which has become quite an issue for her is difficult for her to handle with the classic teenage attitudes. Unfortunately she has legal custody of her grandchildren and is now having the parent for second time as a single parent. She also works full-time drop. 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 She thinks that once she is able to get her grandkids off to See, her level of stress  will reduce.  Fluttering episodes often occur when she "gets upset or stressed about a certain issue. She usually has to go sit down & take a few deep breaths. Maybe 2-3 spells in a month.    No chest pain or shortness of breath with rest or exertion. No PND, orthopnea or edema.  No lightheadedness, dizziness, weakness or syncope/near syncope. No TIA/amaurosis fugax symptoms. No claudication.  ROS: A comprehensive was performed. Pertinent symptoms noted above. Review of Systems  Constitutional: Negative for malaise/fatigue.  HENT: Negative for congestion.   Respiratory: Negative for cough, shortness of breath and wheezing.   Cardiovascular:       Per history of present illness  Gastrointestinal: Negative for blood in stool and melena.  Genitourinary: Negative for hematuria.  Musculoskeletal: Negative for joint pain.  Skin: Negative.   Neurological: Positive for dizziness (But not associated with her palpitations. This is more vertigo-like symptoms.).  Psychiatric/Behavioral: Negative.        Social stress. But does not appear to be depressed or overly anxious.  All other systems reviewed and are negative.   I have reviewed and (if needed) personally updated the patient's problem list, medications, allergies, past medical and surgical history, social and family history.   Past Medical History:  Diagnosis Date  . Arthritis    back   . Back pain   . Cataract    small immature  . Depression   . GERD (gastroesophageal reflux disease)   . Hyperlipidemia   . Hypertension   . Spinal stenosis, lumbar region, without  neurogenic claudication     Past Surgical History:  Procedure Laterality Date  . CHOLECYSTECTOMY    . HAND SURGERY     fatty tissue removed   . TRANSTHORACIC ECHOCARDIOGRAM  12/2001   Normal LV size and function. EF 55-65%. Normal regional wall motion. No valvular lesions.  Marland Kitchen. VAGINAL HYSTERECTOMY  1983    No outpatient prescriptions have been marked as taking  for the 09/19/16 encounter (Office Visit) with Morgan Swanson, Morgan Sealy W, MD.    Allergies  Allergen Reactions  . Codeine   . Penicillins     Social History   Social History  . Marital status: Single    Spouse name: N/A  . Number of children: 2  . Years of education: HS   Occupational History  . [employer]   Social History Main Topics  . Smoking status: Former Smoker    Quit date: 04/03/1999  . Smokeless tobacco: Never Used  . Alcohol use 0.6 oz/week    1 Glasses of wine per week     Comment: occ Holidays  . Drug use: No  . Sexual activity: Not Asked   Other Topics Concern  . None   Social History Narrative   Pt lives at home with her four teenage grandchildren - has legal custody.    Patient is single.   Works [employer] as a [employer].   Caffeine Use- 2 cups of tea daily.   Right handed.    family history includes Breast cancer in her mother; Diabetes in her mother; Fibromyalgia in her mother; Lung cancer in her father; Stroke in her brother.  Wt Readings from Last 3 Encounters:  09/19/16 142 lb 6.4 oz (64.6 kg)  09/10/16 143 lb (64.9 kg)  03/21/16 145 lb (65.8 kg)    PHYSICAL EXAM BP (!) 160/86 (BP Location: Right Arm, Patient Position: Sitting, Cuff Size: Normal)   Pulse (!) 59   Ht 5\' 3"  (1.6 m)   Wt 142 lb 6.4 oz (64.6 kg)   BMI 25.23 kg/m  General appearance: alert, cooperative, appears Younger than stated age, no distress. Well-nourished, well-groomed HEENT: [place]/AT, EOMI, MMM, anicteric sclera Neck: no adenopathy, no carotid bruit and no JVD Lungs: clear to auscultation bilaterally, normal percussion bilaterally and non-labored Heart: RRR, normal S1 and S2. No rubs or gallops. Off systolic murmur at the sternal border. With distant S1 and S2, very difficult to tell if this is a holosystolic versus systolic ejection murmur. Abdomen: soft, non-tender; bowel sounds normal; no masses,  no organomegaly; no HJR Extremities:  extremities normal, atraumatic, no cyanosis, or edema  Pulses: 2+ and symmetric;  Skin: mobility and turgor normal, no evidence of bleeding or bruising and no lesions noted  Neurologic: Mental status: Alert & oriented x 3, thought content appropriate; non-focal exam.  Pleasant mood & affect. Cranial nerves: normal (II-XII grossly intact)    Adult ECG Report  Rate: 59 ;  Rhythm: sinus bradycardia, sinus arrhythmia and Normal axis, intervals and durations. Borderline criteria for LVH;   Narrative Interpretation: Otherwise normal EKG   Other studies Reviewed: Additional studies/ records that were reviewed today include:  Recent Labs:  From 05/28/2016  CBC: W5.2, H/H 13.2/39.9, platelets 302.  CMP: Sodium 140, potassium 4.3, chloride 107, bicarbonate 26, glucose 73, BUN/creatinine 19/0.72. Normal LFTs  Total cholesterol 187, triglycerides are 45, HDL 106, LDL 72.  TSH 0.97 Free T4 1 0.1, hemoglobin A1c 5.8   ASSESSMENT / PLAN: Problem List Items Addressed This  Visit    Essential hypertension (Chronic)    Blood pressure is high today, but was relatively well-controlled her PCPs office. Probably more because she is stressed out and got lost getting here today clinic today.      Relevant Orders   EKG 12-Lead   Heart palpitations - Primary    Short little flutter episodes sound may be more than just solitary PVCs or PACs, the fact that last a couple minutes with maintenance either potentially bigeminy or notably arrhythmias at this short runs of PAT or PAF.  Plan: Based on how infrequent episodes are now occurring (much less often since the kids are out of school), we will need at least to 30 day monitor to hopefully capture an episode. We will order 2-D echo to evaluate for any structural abnormalities.      Relevant Orders   EKG 12-Lead   ECHOCARDIOGRAM COMPLETE   Cardiac event monitor   Systolic murmur (Chronic)    Difficult to really ascertain what the murmur is, I would  suspect is probably aortic sclerosis, cannot exclude a more concerning condition. His maybe not having been doing her palpitations. Plan: Check echocardiogram.      Relevant Orders   EKG 12-Lead   ECHOCARDIOGRAM COMPLETE      Current medicines are reviewed at length with the patient today. (+/- concerns) None The following changes have been made: None  Patient Instructions  SCHEDULE BOTH AT [address] Your physician has requested that you have an echocardiogram. Echocardiography is a painless test that uses sound waves to create images of your heart. It provides your doctor with information about the size and shape of your heart and how well your heart's chambers and valves are working. This procedure takes approximately one hour. There are no restrictions for this procedure.    Your physician has recommended that you wear an event monitor --30 DAYS. Event monitors are medical devices that record the heart's electrical activity. Doctors most often [place] these monitors to diagnose arrhythmias. Arrhythmias are problems with the speed or rhythm of the heartbeat. The monitor is a small, portable device. You can wear one while you do your normal daily activities. This is usually used to diagnose what is causing palpitations/syncope (passing out).    NO CAHN WITH CURRENT MEDICATION     Your physician recommends that you schedule a follow-up appointment in 2 MONTHS WITH DR [facility] AFTER TEST.     Studies Ordered:   Orders Placed This Encounter  Procedures  . Cardiac event monitor  . EKG 12-Lead  . ECHOCARDIOGRAM COMPLETE      Bryan Lemma, M.D., M.S. Interventional Cardiologist   Pager # [phone] Phone # [phone] [address] [place], [place] [address]

## 2016-09-19 NOTE — Assessment & Plan Note (Signed)
Short little flutter episodes sound may be more than just solitary PVCs or PACs, the fact that last a couple minutes with maintenance either potentially bigeminy or notably arrhythmias at this short runs of PAT or PAF.  Plan: Based on how infrequent episodes are now occurring (much less often since the kids are out of school), we will need at least to 30 day monitor to hopefully capture an episode. We will order 2-D echo to evaluate for any structural abnormalities.

## 2016-09-19 NOTE — Patient Instructions (Addendum)
SCHEDULE BOTH AT [address] Your physician has requested that you have an echocardiogram. Echocardiography is a painless test that uses sound waves to create images of your heart. It provides your doctor with information about the size and shape of your heart and how well your heart's chambers and valves are working. This procedure takes approximately one hour. There are no restrictions for this procedure.    Your physician has recommended that you wear an event monitor --30 DAYS. Event monitors are medical devices that record the heart's electrical activity. Doctors most often us these monitors to diagnose arrhythmias. Arrhythmias are problems with the speed or rhythm of the heartbeat. The monitor is a small, portable device. You can wear one while you do your normal daily activities. This is usually used to diagnose what is causing palpitations/syncope (passing out).    NO CAHN WITH CURRENT MEDICATION     Your physician recommends that you schedule a follow-up appointment in 2 MON[facility].

## 2016-09-19 NOTE — Assessment & Plan Note (Signed)
Difficult to really ascertain what the murmur is, I would suspect is probably aortic sclerosis, cannot exclude a more concerning condition. His maybe not having been doing her palpitations. Plan: Check echocardiogram.

## 2016-09-19 NOTE — Assessment & Plan Note (Signed)
Blood pressure is high today, but was relatively well-controlled her PCPs office. Probably more because she is stressed out and got lost getting here today clinic today.

## 2016-09-24 ENCOUNTER — Ambulatory Visit
Admission: RE | Admit: 2016-09-24 | Discharge: 2016-09-24 | Disposition: A | Discharge status: Home / Self Care | Payer: BC Managed Care – PPO | Source: Ambulatory Visit | Attending: Neurology | Admitting: Neurology

## 2016-09-24 DIAGNOSIS — G544 Lumbosacral root disorders, not elsewhere classified: Secondary | ICD-10-CM

## 2016-09-24 DIAGNOSIS — G8929 Other chronic pain: Secondary | ICD-10-CM | POA: Diagnosis not present

## 2016-09-24 DIAGNOSIS — M5440 Lumbago with sciatica, unspecified side: Secondary | ICD-10-CM

## 2016-09-24 DIAGNOSIS — M48061 Spinal stenosis, lumbar region without neurogenic claudication: Secondary | ICD-10-CM | POA: Diagnosis not present

## 2016-10-04 ENCOUNTER — Encounter (INDEPENDENT_AMBULATORY_CARE_PROVIDER_SITE_OTHER): Payer: BC Managed Care – PPO

## 2016-10-04 ENCOUNTER — Ambulatory Visit (HOSPITAL_COMMUNITY): Payer: BC Managed Care – PPO | Attending: Cardiology

## 2016-10-04 ENCOUNTER — Other Ambulatory Visit: Payer: Self-pay

## 2016-10-04 DIAGNOSIS — R002 Palpitations: Secondary | ICD-10-CM

## 2016-10-04 DIAGNOSIS — I340 Nonrheumatic mitral (valve) insufficiency: Secondary | ICD-10-CM | POA: Insufficient documentation

## 2016-10-04 DIAGNOSIS — I517 Cardiomegaly: Secondary | ICD-10-CM | POA: Insufficient documentation

## 2016-10-04 DIAGNOSIS — R011 Cardiac murmur, unspecified: Secondary | ICD-10-CM

## 2016-10-04 NOTE — Progress Notes (Signed)
Echo results: Good news: Essentially normal echocardiogram and normal pump function and normal valve function. No regional wall motion abnormalities = No signs to suggest heart attack.. EF: 55-60%. Nothing to explain murmur.  Mild effect of HTN on LV with mild LVH & Gr 1 DD.  Need to Rx BP.  Bryan Lemmaavid Jakhi Dishman, MD

## 2016-10-05 ENCOUNTER — Telehealth: Payer: Self-pay | Admitting: *Deleted

## 2016-10-05 NOTE — Telephone Encounter (Signed)
Left message to call back -test results 

## 2016-10-05 NOTE — Telephone Encounter (Signed)
-----   Message from Marykay Lexavid W Harding, MD sent at 10/04/2016 11:51 PM EDT ----- Echo results: Good news: Essentially normal echocardiogram and normal pump function and normal valve function. No regional wall motion abnormalities = No signs to suggest heart attack.. EF: 55-60%. Nothing to explain murmur.  Mild effect of HTN on LV with mild LVH & Gr 1 DD.  Need to Rx BP.  Bryan Lemmaavid Harding, MD

## 2016-10-08 DIAGNOSIS — M48062 Spinal stenosis, lumbar region with neurogenic claudication: Secondary | ICD-10-CM | POA: Diagnosis not present

## 2016-10-08 DIAGNOSIS — M4316 Spondylolisthesis, lumbar region: Secondary | ICD-10-CM | POA: Diagnosis not present

## 2016-10-08 NOTE — Telephone Encounter (Signed)
Patient called back with results and verbalized understanding.   Echo results: Good news: Essentially normal echocardiogram and normal pump function and normal valve function. No regional wall motion abnormalities = No signs to suggest heart attack.. EF: 55-60%. Nothing to explain murmur.  Mild effect of HTN on LV with mild LVH & Gr 1 DD.  Need to Rx BP.  Bryan Lemmaavid Harding, MD

## 2016-10-08 NOTE — Telephone Encounter (Signed)
New Message ° ° pt verbalized that she is returning call for rn °

## 2016-10-10 ENCOUNTER — Telehealth: Payer: Self-pay | Admitting: Neurology

## 2016-10-10 NOTE — Telephone Encounter (Addendum)
Checked [place] narcotic registry.  Her records indicate two Tramadol #30 fills this entire year (07/17/16 and 10/02/16).  Tramadol is prescribed by her orthopaedic MD for the joint pain in her hands.  States she takes 0.5 to 1 tablet sparingly when the pain is severe.

## 2016-10-10 NOTE — Telephone Encounter (Signed)
Patient requesting refill of  fentaNYL (DURAGESIC - DOSED MCG/HR) 12 MCG/HR. ° ° °

## 2016-10-10 NOTE — Addendum Note (Signed)
Addended by: Lindell SparKIRKMAN, Jaaziel Peatross C on: 10/10/2016 03:15 PM   Modules accepted: Orders

## 2016-10-11 ENCOUNTER — Other Ambulatory Visit: Payer: Self-pay | Admitting: *Deleted

## 2016-10-11 MED ORDER — FENTANYL 12 MCG/HR TD PT72: 12.5000 ug | MEDICATED_PATCH | TRANSDERMAL | 0 refills | Status: DC

## 2016-10-11 NOTE — Addendum Note (Signed)
Addended by: Lindell SparKIRKMAN, MICHELLE C on: 10/11/2016 10:18 AM   Modules accepted: Orders

## 2016-10-11 NOTE — Telephone Encounter (Signed)
Patient arrived to our office to pick up her Fentanyl prescription.  Dr. Epimenio FootSater is the work-in MD this morning.  He approved refill of her Fentanyl this time.  When Dr. Terrace ArabiaYan returns to the office, we will clarify if patient is able to continue getting Tramadol 50mg  from her orthopaedic MD while getting Fentanyl 12mcg from our office.

## 2016-10-12 NOTE — Telephone Encounter (Signed)
Patient aware to continue to wearing the heart monitor. She verbalized understanding.

## 2016-10-17 NOTE — Telephone Encounter (Signed)
Dr. Terrace ArabiaYan has reviewed this patient's chart and is aware of the Tramadol prescriptions.  She has agreed to continue filling Fentanyl for her. We will continue to monitor her  narcotic registry.

## 2016-10-26 ENCOUNTER — Ambulatory Visit (INDEPENDENT_AMBULATORY_CARE_PROVIDER_SITE_OTHER): Payer: BC Managed Care – PPO | Admitting: Neurology

## 2016-10-26 DIAGNOSIS — G8929 Other chronic pain: Secondary | ICD-10-CM

## 2016-10-26 DIAGNOSIS — M5440 Lumbago with sciatica, unspecified side: Secondary | ICD-10-CM | POA: Diagnosis not present

## 2016-10-26 DIAGNOSIS — G544 Lumbosacral root disorders, not elsewhere classified: Secondary | ICD-10-CM

## 2016-10-26 NOTE — Procedures (Signed)
Full Name: Morgan Swanson Gender: Female MRN #: [id number] Date of Birth: [birth year]    Visit Date: 10/26/16 09:57 Age: [age] Years 2 Months Old Examining Physician: Levert FeinsteinYijun Quante Pettry, MD  Referring Physician: Terrace ArabiaYan, MD History: 66 years old female with history of chronic low back pain, mild gait abnormality  Summary of the tests:  Nerve Conduction Study: Bilateral sural, superficial peroneal sensory responses were normal. Bilateral tibial, left peroneal to EDB motor responses were normal. Right peroneal to EDB motor responses showed mildly decreased C map amplitude.  Electromyography: Selective needle examinations were performed at bilateral lower extremity muscles bilateral lumbosacral paraspinal muscles. There is evidence of mild chronic neuropathic changes most obvious at right L4-5 myotomes.  Conclusion: This is a mild abnormal study. There is electrodiagnostic evidence of mild chronic bilateral lumbosacral radiculopathy, mainly involving bilateral L4-L5 myotomes, right worse than left.    ------------------------------- Physician Name, M.D.  [facility] [address] [place], [place] [address] Tel: [phone] Fax: [phone]        [facility]    Nerve / Sites Muscle Latency Ref. Amplitude Ref. Rel Amp Segments Distance Velocity Ref. Area    ms ms mV mV %  cm m/s m/s mVms  L Peroneal - EDB     Ankle EDB 4.9 ?6.5 3.3 ?2.0 100 Ankle - EDB 9   9.6     Fib head EDB 10.8  3.5  104 Fib head - Ankle 27 46 ?44 10.4     Pop fossa EDB 12.9  3.4  96.7 Pop fossa - Fib head 10 49 ?44 10.1         Pop fossa - Ankle      R Peroneal - EDB     Ankle EDB 5.0 ?6.5 1.6 ?2.0 100 Ankle - EDB 9   4.0     Fib head EDB 11.1  1.3  80.2 Fib head - Ankle 27 44 ?44 3.4     Pop fossa EDB 13.3  1.2  98.8 Pop fossa - Fib head 10 45 ?44 3.8         Pop fossa - Ankle      L Tibial - AH     Ankle AH 4.3 ?5.8 14.4 ?4.0 100 Ankle - AH 9   33.1     Pop fossa AH 11.7  10.2  71.1 Pop fossa  - Ankle 35 47 ?41 29.1  R Tibial - AH     Ankle AH 4.8 ?5.8 11.8 ?4.0 100 Ankle - AH 9   34.1     Pop fossa AH 12.4  8.3  70.2 Pop fossa - Ankle 35 46 ?41 24.6             SNC    Nerve / Sites Rec. Site Peak Lat Ref.  Amp Ref. Segments Distance    ms ms V V  cm  L Sural - Ankle (Calf)     Calf Ankle 3.6 ?4.4 9 ?6 Calf - Ankle 14  R Sural - Ankle (Calf)     Calf Ankle 3.2 ?4.4 14 ?6 Calf - Ankle 14  L Superficial peroneal - Ankle     Lat leg Ankle 3.8 ?4.4 8 ?6 Lat leg - Ankle 14  R Superficial peroneal - Ankle     Lat leg Ankle 3.4 ?4.4 12 ?6 Lat leg - Ankle 14             F  Wave  Nerve F Lat Ref.   ms ms  L Tibial - AH 48.9 ?56.0  R Tibial - AH 49.3 ?56.0         H Reflex    Nerve H Lat Lat Hmax   ms ms   Left Right Ref. Left Right Ref.  Tibial - [place] 34.2 34.0 ?35.0 34.6 36.4 ?35.0         EMG full       EMG Summary Table    Spontaneous MUAP Recruitment  Muscle IA Fib PSW Fasc Other Amp Dur. Poly Pattern  R. Tibialis anterior Increased None None None _______ Normal Normal Normal Reduced  R. Tibialis posterior Normal None None None _______ Normal Normal Normal Reduced  R. Gastrocnemius (Medial head) Normal None None None _______ Normal Normal Normal Normal  R. Vastus lateralis Normal None None None _______ Normal Normal Normal Normal  L. Tibialis anterior Normal None None None _______ Normal Normal Normal Normal  L. Gastrocnemius (Medial head) Normal None None None _______ Normal Normal Normal Normal  L. Vastus lateralis Normal None None None _______ Normal Normal Normal Normal  L. Lumbar paraspinals (mid) Normal None None None _______ Normal Normal Normal Normal  L. Lumbar paraspinals (low) Normal None None None _______ Normal Normal Normal Normal  R. Lumbar paraspinals (mid) Normal None None None _______ Normal Normal Normal Normal  R. Lumbar paraspinals (low) Normal None None None _______ Normal Normal Normal Normal

## 2016-10-26 NOTE — Progress Notes (Signed)
[facility]  PATIENT: Morgan Swanson DOB: 07/28/1950  HISTORY OF PRESENT ILLNESS:Ms. Coto is a 66 years old right-handed African American female, returns to followup for her low back pain, lumbar stenosis,   She had a past medical history of hypertension, hyperlipidemia.     She presented for low back pain since 2008, her low back pain radiating to her right lower extremity, involving right plantar foot, she denies significant weakness, no gait difficulty, she has been treated with fentanyl patch2512mcg/q72 hours, which has been very helpful, she only has minimal discomfort. She denies bowel or bladder incontinence.   Most recent MRI lumbar was in 11/2006, which showed grade 1 anterolisthesis L4-5 with moderate to severe facet hypertrophy and moderate to severe central canal stenosis, moderate to severe right foraminal narrowing, right greater than left at L4-5.   Patient has a signed narcotic agreement with this practice. She has been doing her exercises, HEP. She is also taking her gabapentin. She was recently placed trazodone for sleep with good benefit. She is raising her 4 grandchildren. She continues to work full-time. Her work restrictions are no lifting over 20 pounds She returns for reevaluation.   UPDATE September 10 2016: She still has low back pain, also complains of increased numbness, tingling of both legs, right worse than left, mild gait abnormality, right leg feels heavy, no bowel and bladder incontinence She still [place] [employer],  We have personally reviewed MRI lumbar in 2008, grade 1 anterolisthesis of L4-5 with moderate to severe facet hypertrophy and a moderate to severe central canal stenosis, lateral recess stenosis right more than left,  She was found to have mild bilateral toe extension weakness right more than left, length dependent sensory changes, antalgic gait  UPDATE October 26 2016: She continue complains of constant pain  at lower extremity radiating pain to right leg, right leg numbness, mild gait abnormality, she was found to have mild bilateral ankle dorsiflexion weakness, right worse than left, length dependent sensory changes,  We have personally reviewed MRI of lumbar on September 24 2016, multilevel degenerative disc disease most severe at L4-5, L5-S1, severe spinal stenosis, 9 mm anterolisthesis of L4, severe bilateral foraminal narrowing,  I reviewed neurosurgeon evaluation by Dr. Mikal Planeabell on October 08 2016: Profound stenosis at L4-5 secondary to ligamentum flavum hypertrophy,  facet hypertrophy, even worse facet hypertrophy L5-S1, but patient is not in significant pain, 6 out of 10, with movement Up to 9 Out Of 10, She Was Referred to Injection First,  REVIEW OF SYSTEMS: Full 14 system review of systems performed and notable only for those listed, all others are neg:  As above   ALLERGIES: Allergies  Allergen Reactions  . Codeine   . Penicillins     HOME MEDICATIONS: Outpatient Medications Prior to Visit  Medication Sig Dispense Refill  . amLODipine (NORVASC) 10 MG tablet Take 1 tablet by mouth daily.    . Cholecalciferol (VITAMIN D3) 50000 units CAPS Take 50,000 Units by mouth once a week.    . CRESTOR 20 MG tablet Take 1 tablet by mouth daily.    . Diclofenac Sodium (PENNSAID) 2 % SOLN APPLY TO AFFECTED AREA 2-3 TIMES PRN 1 Bottle 0  . fentaNYL (DURAGESIC - DOSED MCG/HR) 12 MCG/HR Place 1 patch (12.5 mcg total) onto the skin every 3 (three) days. Patient has drug agreement 10 patch 0  . gabapentin (NEURONTIN) 100 MG capsule take 3 capsules by mouth at bedtime 90 capsule 11  .  lisinopril (PRINIVIL,ZESTRIL) 10 MG tablet Take 10 mg by mouth daily.  0  . meloxicam (MOBIC) 7.5 MG tablet Take 1 tablet (7.5 mg total) by mouth 2 (two) times daily. 60 tablet 11  . omeprazole (PRILOSEC) 40 MG capsule Take 1 capsule by mouth daily.    . traMADol (ULTRAM) 50 MG tablet Take 1 tablet (50 mg total) by mouth every 12  (twelve) hours as needed. 30 tablet 2  . traZODone (DESYREL) 50 MG tablet Take 50 mg by mouth. 1/4 to 1/2 tablet at bedtime per pt  0  . triamterene-hydrochlorothiazide (MAXZIDE-25) 37.5-25 MG tablet Take 0.5 tablets by mouth daily.  0   No facility-administered medications prior to visit.     PAST MEDICAL HISTORY: Past Medical History:  Diagnosis Date  . Arthritis    back   . Back pain   . Cataract    small immature  . Depression   . GERD (gastroesophageal reflux disease)   . Hyperlipidemia   . Hypertension   . Spinal stenosis, lumbar region, without neurogenic claudication     PAST SURGICAL HISTORY: Past Surgical History:  Procedure Laterality Date  . CHOLECYSTECTOMY    . HAND SURGERY     fatty tissue removed   . TRANSTHORACIC ECHOCARDIOGRAM  12/2001   Normal LV size and function. EF 55-65%. Normal regional wall motion. No valvular lesions.  Marland Kitchen. VAGINAL HYSTERECTOMY  1983    FAMILY HISTORY: Family History  Problem Relation Age of Onset  . Breast cancer Mother   . Diabetes Mother   . Fibromyalgia Mother   . Lung cancer Father   . Stroke Brother   . Colon cancer Neg Hx   . Colon polyps Neg Hx   . Esophageal cancer Neg Hx   . Rectal cancer Neg Hx   . Stomach cancer Neg Hx     SOCIAL HISTORY: Social History   Social History  . Marital status: Single    Spouse name: N/A  . Number of children: 2  . Years of education: HS   Occupational History  . [employer]   Social History Main Topics  . Smoking status: Former Smoker    Quit date: 04/03/1999  . Smokeless tobacco: Never Used  . Alcohol use 0.6 oz/week    1 Glasses of wine per week     Comment: occ Holidays  . Drug use: No  . Sexual activity: Not on file   Other Topics Concern  . Not on file   Social History Narrative   Pt lives at home with her four teenage grandchildren - has legal custody.    Patient is single.   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   Caffeine  Use- 2 cups of tea daily.   Right handed.    PHYSICAL EXAMNIATION:  Gen: NAD, conversant, well nourised, obese, well groomed                     Cardiovascular: Regular rate rhythm, no peripheral edema, warm, nontender. Eyes: Conjunctivae clear without exudates or hemorrhage Neck: Supple, no carotid bruits. Pulmonary: Clear to auscultation bilaterally   NEUROLOGICAL EXAM:  MENTAL STATUS: Speech:    Speech is normal; fluent and spontaneous with normal comprehension.  Cognition:     Orientation to time, place and person     Normal recent and remote memory     Normal Attention span and concentration     Normal Language, naming, repeating,spontaneous speech  Fund of knowledge   CRANIAL NERVES: CN II: Visual fields are full to confrontation. Fundoscopic exam is normal with sharp discs and no vascular changes. Pupils are round equal and briskly reactive to light. CN III, IV, VI: extraocular movement are normal. No ptosis. CN V: Facial sensation is intact to pinprick in all 3 divisions bilaterally. Corneal responses are intact.  CN VII: Face is symmetric with normal eye closure and smile. CN VIII: Hearing is normal to rubbing fingers CN IX, X: Palate elevates symmetrically. Phonation is normal. CN XI: Head turning and shoulder shrug are intact CN XII: Tongue is midline with normal movements and no atrophy.  MOTOR: She has mild bilateral toe extension weakness, right worse than left,   REFLEXES: Reflexes are 2+ and symmetric at the biceps, triceps, knees, and absent at ankles. Plantar responses are flexor.  SENSORY: Intact to light touch, pinprick, positional and vibratory sensation are intact in fingers and toes.  COORDINATION: Rapid alternating movements and fine finger movements are intact. There is no dysmetria on finger-to-nose and heel-knee-shin.    GAIT/STANCE: Mild right foot drop, difficulty performing bilateral heel walking, right worse than left   DIAGNOSTIC  DATA (LABS, IMAGING, TESTING) -  ASSESSMENT AND PLAN 66 y.o. year old female Lumbar radiculopathy, worsening bilateral lower extremity paresthesia, mild weakness, chronic pain  MRI of lumbar showed severe L4-5, L5-S1 spinal stenosis, bilateral foraminal narrowing,  Chronic bilateral lumbosacral radiculopathy on electrodiagnostic study, right worse than left, mainly involving L4-5 myotomes  Epidural injection is scheduled for neurosurgeon clinic,  Continue pain management  Levert Feinstein, M.D. Ph.D.  [facility] [address] [place], [place] [address] Phone: [phone] Fax:      [phone]

## 2016-10-26 NOTE — Progress Notes (Signed)
[facility]  PATIENT: Morgan Swanson DOB: 04/08/1950  HISTORY OF PRESENT ILLNESS:Morgan Swanson is a 66 years old right-handed African American female, returns to followup for her low back pain, lumbar stenosis,   She had a past medical history of hypertension, hyperlipidemia.     She presented for low back pain since 2008, her low back pain radiating to her right lower extremity, involving right plantar foot, she denies significant weakness, no gait difficulty, she has been treated with fentanyl patch2012mcg/q72 hours, which has been very helpful, she only has minimal discomfort. She denies bowel or bladder incontinence.   Most recent MRI lumbar was in 11/2006, which showed grade 1 anterolisthesis L4-5 with moderate to severe facet hypertrophy and moderate to severe central canal stenosis, moderate to severe right foraminal narrowing, right greater than left at L4-5.   Patient has a signed narcotic agreement with this practice. She has been doing her exercises, HEP. She is also taking her gabapentin. She was recently placed trazodone for sleep with good benefit. She is raising her 4 grandchildren. She continues to work full-time. Her work restrictions are no lifting over 20 pounds She returns for reevaluation.   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 Codeine   . Penicillins     HOME MEDICATIONS: Outpatient Medications Prior to Visit  Medication Sig Dispense Refill  . amLODipine (NORVASC) 10 MG tablet Take 1 tablet by mouth daily.    . Cholecalciferol (VITAMIN D3) 50000 units CAPS Take 50,000 Units by mouth once a week.    . CRESTOR 20 MG tablet Take 1 tablet by mouth daily.    . Diclofenac Sodium (PENNSAID) 2 % SOLN APPLY TO AFFECTED AREA 2-3 TIMES PRN 1 Bottle 0  . fentaNYL (DURAGESIC - DOSED MCG/HR) 12 MCG/HR Place 1 patch (12.5 mcg total) onto the skin every 3 (three) days. Patient has drug agreement 10 patch 0  . gabapentin (NEURONTIN) 100 MG capsule take 3 capsules by mouth at bedtime 90 capsule 11  . lisinopril (PRINIVIL,ZESTRIL) 10 MG tablet Take 10 mg by mouth daily.  0  . meloxicam (MOBIC) 7.5 MG tablet Take 1 tablet (7.5 mg total) by mouth 2 (two) times daily. 60 tablet 11  . omeprazole (PRILOSEC) 40 MG capsule Take 1 capsule by mouth daily.    . traMADol (ULTRAM) 50 MG tablet Take 1 tablet (50 mg total) by mouth every 12 (twelve) hours as needed. 30 tablet 2  . traZODone (DESYREL) 50 MG tablet Take 50 mg by mouth. 1/4 to 1/2 tablet at bedtime per pt  0  . triamterene-hydrochlorothiazide (MAXZIDE-25) 37.5-25 MG tablet Take 0.5 tablets by mouth daily.  0   No facility-administered medications prior to visit.     PAST MEDICAL HISTORY: Past Medical History:  Diagnosis Date  . Arthritis    back   . Back pain   . Cataract    small immature  . Depression   . GERD (gastroesophageal reflux disease)   . Hyperlipidemia   . Hypertension   . Spinal stenosis, lumbar region, without neurogenic claudication     PAST SURGICAL HISTORY: Past Surgical History:  Procedure Laterality Date  . CHOLECYSTECTOMY    . HAND SURGERY     fatty tissue removed   . TRANSTHORACIC ECHOCARDIOGRAM  12/2001   Normal LV size and  function. EF 55-65%. Normal regional wall motion. No valvular lesions.  Marland Kitchen. VAGINAL HYSTERECTOMY  1983    FAMILY HISTORY: Family History  Problem Relation Age of Onset  . Breast cancer Mother   . Diabetes Mother   . Fibromyalgia Mother   . Lung cancer Father   . Stroke Brother   . Colon cancer Neg Hx   . Colon polyps Neg Hx   . Esophageal cancer Neg Hx   . Rectal cancer Neg Hx   . Stomach cancer Neg Hx     SOCIAL HISTORY: Social History   Social History  . Marital status: Single    Spouse name: N/A  . Number of children: 2  . Years of education: HS   Occupational History  . [employer]   Social History Main Topics  . Smoking status: Former Smoker    Quit date: 04/03/1999  . Smokeless tobacco: Never Used  . Alcohol use 0.6 oz/week    1 Glasses of wine per week     Comment: occ Holidays  . Drug use: No  . Sexual activity: Not on file   Other Topics Concern  . Not on file   Social History Narrative   Pt lives at home with her four teenage grandchildren - has legal custody.    Patient is single.   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   Caffeine Use- 2 cups of tea daily.   Right handed.    PHYSICAL EXAMNIATION:  Gen: NAD, conversant, well nourised, obese, well groomed                     Cardiovascular: Regular rate rhythm, no peripheral edema, warm, nontender. Eyes: Conjunctivae clear without exudates or hemorrhage Neck: Supple, no carotid bruits. Pulmonary: Clear to auscultation bilaterally   NEUROLOGICAL EXAM:  MENTAL STATUS: Speech:    Speech is normal; fluent and spontaneous with normal comprehension.  Cognition:     Orientation to time, place and person     Normal recent and remote memory     Normal Attention span and concentration     Normal Language, naming, repeating,spontaneous speech     Fund of knowledge   CRANIAL NERVES: CN II: Visual fields are full to confrontation. Fundoscopic exam is normal with sharp  discs and no vascular changes. Pupils are round equal and briskly reactive to light. CN III, IV, VI: extraocular movement are normal. No ptosis. CN V: Facial sensation is intact to pinprick in all 3 divisions bilaterally. Corneal responses are intact.  CN VII: Face is symmetric with normal eye closure and smile. CN VIII: Hearing is normal to rubbing fingers CN IX, X: Palate elevates symmetrically. Phonation is normal. CN XI: Head turning and shoulder shrug are intact CN XII: Tongue is midline with normal movements and no atrophy.  MOTOR: She has mild bilateral toe extension weakness, right worse than left,   REFLEXES: Reflexes  are 2+ and symmetric at the biceps, triceps, knees, and absent at ankles. Plantar responses are flexor.  SENSORY: Intact to light touch, pinprick, positional and vibratory sensation are intact in fingers and toes.  COORDINATION: Rapid alternating movements and fine finger movements are intact. There is no dysmetria on finger-to-nose and heel-knee-shin.    GAIT/STANCE: Antalgic gait.   DIAGNOSTIC DATA (LABS, IMAGING, TESTING) -  ASSESSMENT AND PLAN 66 y.o. year old female Lumbar radiculopathy, worsening bilateral lower extremity paresthesia, mild weakness, chronic pain  Repeat MRI of lumbar  EMG nerve conduction study  Referred to neurosurgeon  Refill her medication fentanyl patch 12.5 mg 10 patch each months, as needed Mobic, gabapentin 100 mg 3 times a day  Levert Feinstein, M.D. Ph.D.  [facility] [address] [place], [place] [address] Phone: [phone] Fax:      [phone]  Addendum: I reviewed neurosurgeon evaluation by Dr. Mikal Plane on October 08 2016: Profound stenosis at L4-5 secondary to ligamentum flavum hypertrophy, with the cyst, facet hypertrophy, even worse facet hypertrophy L5-S1, but patient is not in significant pain, 6 out of 10, with movement Up to 9 Out Of 10, She Was Referred to Injection First,

## 2016-10-29 DIAGNOSIS — Z6825 Body mass index (BMI) 25.0-25.9, adult: Secondary | ICD-10-CM | POA: Diagnosis not present

## 2016-10-29 DIAGNOSIS — M47816 Spondylosis without myelopathy or radiculopathy, lumbar region: Secondary | ICD-10-CM | POA: Diagnosis not present

## 2016-10-29 DIAGNOSIS — I10 Essential (primary) hypertension: Secondary | ICD-10-CM | POA: Diagnosis not present

## 2016-10-29 DIAGNOSIS — M5416 Radiculopathy, lumbar region: Secondary | ICD-10-CM | POA: Diagnosis not present

## 2016-10-29 DIAGNOSIS — Z6826 Body mass index (BMI) 26.0-26.9, adult: Secondary | ICD-10-CM | POA: Diagnosis not present

## 2016-10-29 DIAGNOSIS — M48062 Spinal stenosis, lumbar region with neurogenic claudication: Secondary | ICD-10-CM | POA: Diagnosis not present

## 2016-10-30 DIAGNOSIS — I10 Essential (primary) hypertension: Secondary | ICD-10-CM | POA: Diagnosis not present

## 2016-10-30 DIAGNOSIS — M5416 Radiculopathy, lumbar region: Secondary | ICD-10-CM | POA: Diagnosis not present

## 2016-10-30 DIAGNOSIS — M48062 Spinal stenosis, lumbar region with neurogenic claudication: Secondary | ICD-10-CM | POA: Diagnosis not present

## 2016-11-07 ENCOUNTER — Encounter: Payer: Self-pay | Admitting: Cardiology

## 2016-11-07 ENCOUNTER — Ambulatory Visit (INDEPENDENT_AMBULATORY_CARE_PROVIDER_SITE_OTHER): Payer: BC Managed Care – PPO | Admitting: Cardiology

## 2016-11-07 DIAGNOSIS — R002 Palpitations: Secondary | ICD-10-CM

## 2016-11-07 DIAGNOSIS — I10 Essential (primary) hypertension: Secondary | ICD-10-CM

## 2016-11-07 DIAGNOSIS — R011 Cardiac murmur, unspecified: Secondary | ICD-10-CM

## 2016-11-07 NOTE — Patient Instructions (Signed)
NO CHANGE WITH CURRENT TREATMENT OR MEDICATIONS     Your physician recommends that you schedule a follow-up appointment ON AN AS NEEDED BASIS.

## 2016-11-07 NOTE — Progress Notes (Signed)
PCP: Zachery Dauer, FNP; Dr. Willey Blade.  Clinic Note: Chief Complaint  Patient presents with  . Follow-up    2 months;  . Palpitations    HPI: Morgan Swanson is a 66 y.o. female who is being seen today for the evaluation of heart fluttering at the request of Odem, Deeann Cree, FNP. She has a history of hypertension hyperglycemia/prediabetes She has chronic pain with lumbar radiculopathy and has signed a narcotic agreement with her neurology clinic. She has significant stress managing household with 4 grandchildren from she's had to take legal vessel.  Per PCPs note, she is back to work now and also exercising regularly  Initially seen on 09/19/2016 -->  She noted some fluttering sensations in her chest that often occur when she "would get upset or stressed ". She usually sits down and takes a few minutes of a breaths and they go away. She was noting 2-3 spells a month. Usually episodes did not last more than 5 minutes. She described the sensation as a "butterfly sensation in the upper chest ".  Recent Hospitalizations: None  Studies Personally Reviewed - (if available, images/films reviewed: From Epic Chart or Care Everywhere)  Transthoracic Echo: October 04, 2016: Normal LV size with mild LV hypertrophy. EF 55-60%. Mildly increased RV size with normal systolic function. No significant valvular abnormalities  30 day and monitor: 15 events noted. Mostly sinus rhythm with occasional bradycardia. Low rate 50. Average rate 70 bpm. Rare PACs but no runs or couplets. No arrhythmia noted.  Interval History: She presents today overall feeling better. She says the fluttering sensation is not as noticeable under social stresses notably improved. A lot of things seem to be better because her chronic back pain is improved after having had some injections. She is no longer in chronic pain. She also seems to be under a lot less social stress.  She denies any lightheadedness, dizziness or wooziness. No  syncope/near-syncope or TIA/amaurosis fugax. No chest express with rest or exertion. No exertional dyspnea. No PND, orthopnea or edema.   ROS: A comprehensive was performed. Pertinent symptoms noted above. Review of Systems  Constitutional: Negative for malaise/fatigue.  HENT: Negative for congestion.   Respiratory: Negative for cough, shortness of breath and wheezing.   Cardiovascular:       Per history of present illness  Gastrointestinal: Negative for blood in stool and melena.  Genitourinary: Negative for hematuria.  Musculoskeletal: Negative for back pain (Back pain seems to be improved. She did not mention how much pain she was in last time she saw me) and joint pain.  Skin: Negative.   Neurological: Positive for dizziness (more vertigo-like symptoms.). Negative for loss of consciousness and weakness.  Psychiatric/Behavioral: Negative.  The patient is not nervous/anxious (Much less stress).        Social stress. But does not appear to be depressed or overly anxious.  All other systems reviewed and are negative.   I have reviewed and (if needed) personally updated the patient's problem list, medications, allergies, past medical and surgical history, social and family history.   Past Medical History:  Diagnosis Date  . Arthritis    back   . Back pain   . Cataract    small immature  . Depression   . GERD (gastroesophageal reflux disease)   . Hyperlipidemia   . Hypertension   . Spinal stenosis, lumbar region, without neurogenic claudication     Past Surgical History:  Procedure Laterality Date  . CHOLECYSTECTOMY    .  HAND SURGERY     fatty tissue removed   . TRANSTHORACIC ECHOCARDIOGRAM  12/2001; 09/2016   a.Normal LV size and function. EF 55-65%. Normal regional wall motion. No valvular lesions;; b. October 04, 2016: Normal LV size with mild LV hypertrophy. EF 55-60%. Mildly increased RV size with normal systolic function. No significant valvular abnormalities  . VAGINAL  HYSTERECTOMY  1983    Current Meds  Medication Sig  . amLODipine (NORVASC) 10 MG tablet Take 1 tablet by mouth daily.  . Cholecalciferol (VITAMIN D3) 50000 units CAPS Take 50,000 Units by mouth once a week.  . CRESTOR 20 MG tablet Take 1 tablet by mouth daily.  . Diclofenac Sodium (PENNSAID) 2 % SOLN APPLY TO AFFECTED AREA 2-3 TIMES PRN  . gabapentin (NEURONTIN) 100 MG capsule take 3 capsules by mouth at bedtime  . lisinopril (PRINIVIL,ZESTRIL) 10 MG tablet Take 10 mg by mouth daily.  . meloxicam (MOBIC) 7.5 MG tablet Take 1 tablet (7.5 mg total) by mouth 2 (two) times daily.  Marland Kitchen. omeprazole (PRILOSEC) 40 MG capsule Take 1 capsule by mouth daily.  . traMADol (ULTRAM) 50 MG tablet Take 1 tablet (50 mg total) by mouth every 12 (twelve) hours as needed.  . traZODone (DESYREL) 50 MG tablet Take 50 mg by mouth. 1/4 to 1/2 tablet at bedtime per pt  . triamterene-hydrochlorothiazide (MAXZIDE-25) 37.5-25 MG tablet Take 0.5 tablets by mouth daily.  . [DISCONTINUED] fentaNYL (DURAGESIC - DOSED MCG/HR) 12 MCG/HR Place 1 patch (12.5 mcg total) onto the skin every 3 (three) days. Patient has drug agreement    Allergies  Allergen Reactions  . Codeine   . Penicillins     Social History   Social History  . Marital status: Single    Spouse name: N/A  . Number of children: 2  . Years of education: HS   Occupational History  . [employer]   Social History Main Topics  . Smoking status: Former Smoker    Quit date: 04/03/1999  . Smokeless tobacco: Never Used  . Alcohol use 0.6 oz/week    1 Glasses of wine per week     Comment: occ Holidays  . Drug use: No  . Sexual activity: Not Asked   Other Topics Concern  . None   Social History Narrative   Pt lives at home with her four teenage grandchildren - has legal custody.    Patient is single.   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   Caffeine Use- 2 cups of tea daily.   Right handed.  She has a significant  amount social stress now because she is raising her teenage grandchildren which has become quite an issue for her is difficult for her to handle with the classic teenage attitudes. Unfortunately she has legal custody of her grandchildren and is now having the parent for second time as a single parent. She also works full-time drop. Likely to have less stress now because as a [employer]ia [employer], she is off for the summer. She thinks that once she is able to get her grandkids off to See, her level of stress will reduce.   family history includes Breast cancer in her mother; Diabetes in her mother; Fibromyalgia in her mother; Lung cancer in her father; Stroke in her brother.  Wt Readings from Last 3 Encounters:  11/07/16 141 lb 9.6 oz (64.2 kg)  09/19/16 142 lb 6.4 oz (64.6 kg)  09/10/16 143 lb (64.9 kg)  PHYSICAL EXAM BP (!) 148/79   Pulse 68   Ht 5\' 2"  (1.575 m)   Wt 141 lb 9.6 oz (64.2 kg)   BMI 25.90 kg/m   Physical Exam  Constitutional: She is oriented to person, place, and time. She appears well-developed and well-nourished. No distress.  HENT:  Head: Normocephalic and atraumatic.  Neck: No hepatojugular reflux and no JVD present. Carotid bruit is not present.  Cardiovascular: Normal rate, regular rhythm and intact distal pulses.  Exam reveals no gallop and no friction rub.   Murmur (Soft 1/6 SEM at sternal border.) heard. Pulmonary/Chest: Effort normal and breath sounds normal. No respiratory distress. She has no wheezes.  Abdominal: Soft. Bowel sounds are normal. She exhibits no distension. There is no tenderness. There is no rebound.  Musculoskeletal: Normal range of motion. She exhibits no edema.  Neurological: She is alert and oriented to person, place, and time.  Skin: Skin is warm and dry. No erythema.  Psychiatric: She has a normal mood and affect. Her behavior is normal. Judgment and thought content normal.  Nursing note and vitals  reviewed.    Adult ECG Report n/a  Other studies Reviewed: Additional studies/ records that were reviewed today include:  Recent Labs:  From 05/28/2016  CBC: W5.2, H/H 13.2/39.9, platelets 302.  CMP: Sodium 140, potassium 4.3, chloride 107, bicarbonate 26, glucose 73, BUN/creatinine 19/0.72. Normal LFTs  Total cholesterol 187, triglycerides are 45, HDL 106, LDL 72.  TSH 0.97 Free T4 1 0.1, hemoglobin A1c 5.8   ASSESSMENT / PLAN: Problem List Items Addressed This Visit    Essential hypertension (Chronic)    Blood pressure is a bit high today. Has been controlling her PCPs office. She does note that she is more stress, and the cardiologist's office. Will defer to PCP, but she is on max dose amlodipine. She has room to titrate up ACE inhibitor which would probably the best thing to do prior to a new medication. If any medications chosen, would consider beta blocker such as carvedilol at low dose for help with PACs and PVCs      Heart palpitations    Short spells. These may very well be just that she feels PACs or PVCs because there was nothing significant on her monitor. No significant arrhythmias. Relatively normal echocardiogram no structural abnormality.  I think she can follow-up when necessary unless symptoms worsen. If things do recur, I would recommend considering adding a beta blocker.      Systolic murmur (Chronic)    No significant finding on echocardiogram. Most likely consistent with aortic sclerosis not stenosis.         Current medicines are reviewed at length with the patient today. (+/- concerns) None The following changes have been made: None  Patient Instructions  NO CHANGE WITH CURRENT TREATMENT OR MEDICATIONS     Your physician recommends that you schedule a follow-up appointment ON AN AS NEEDED BASIS.    Studies Ordered:   No orders of the defined types were placed in this encounter.     Bryan Lemma, M.D., M.S. Interventional  Cardiologist   Pager # [phone] Phone # [phone] [address] [place], [place] [address]

## 2016-11-08 ENCOUNTER — Other Ambulatory Visit: Payer: Self-pay | Admitting: Neurology

## 2016-11-08 MED ORDER — FENTANYL 12 MCG/HR TD PT72: 12.5000 ug | MEDICATED_PATCH | TRANSDERMAL | 0 refills | Status: DC

## 2016-11-08 NOTE — Telephone Encounter (Signed)
Prescription ready for p/u.  Pt will pick up tomorrow prior to noon.  Placed up front.

## 2016-11-08 NOTE — Telephone Encounter (Signed)
Pt request refill for fentaNYL (DURAGESIC - DOSED MCG/HR) 12 MCG/HR . She will pick up tomorrow and is aware the office closes at noon

## 2016-11-09 ENCOUNTER — Encounter: Payer: Self-pay | Admitting: Cardiology

## 2016-11-09 NOTE — Assessment & Plan Note (Signed)
No significant finding on echocardiogram. Most likely consistent with aortic sclerosis not stenosis.

## 2016-11-09 NOTE — Assessment & Plan Note (Signed)
Blood pressure is a bit high today. Has been controlling her PCPs office. She does note that she is more stress, and the cardiologist's office. Will defer to PCP, but she is on max dose amlodipine. She has room to titrate up ACE inhibitor which would probably the best thing to do prior to a new medication. If any medications chosen, would consider beta blocker such as carvedilol at low dose for help with PACs and PVCs

## 2016-11-09 NOTE — Assessment & Plan Note (Signed)
Short spells. These may very well be just that she feels PACs or PVCs because there was nothing significant on her monitor. No significant arrhythmias. Relatively normal echocardiogram no structural abnormality.  I think she can follow-up when necessary unless symptoms worsen. If things do recur, I would recommend considering adding a beta blocker.

## 2016-12-05 DIAGNOSIS — M48062 Spinal stenosis, lumbar region with neurogenic claudication: Secondary | ICD-10-CM | POA: Diagnosis not present

## 2016-12-05 DIAGNOSIS — M5416 Radiculopathy, lumbar region: Secondary | ICD-10-CM | POA: Diagnosis not present

## 2016-12-05 DIAGNOSIS — I10 Essential (primary) hypertension: Secondary | ICD-10-CM | POA: Diagnosis not present

## 2016-12-10 ENCOUNTER — Telehealth: Payer: Self-pay | Admitting: Neurology

## 2016-12-10 MED ORDER — FENTANYL 12 MCG/HR TD PT72: 12.5000 ug | MEDICATED_PATCH | TRANSDERMAL | 0 refills | Status: DC

## 2016-12-10 NOTE — Telephone Encounter (Signed)
Pt request refill for fentaNYL (DURAGESIC - DOSED MCG/HR) 12 MCG/HR

## 2016-12-10 NOTE — Telephone Encounter (Signed)
No issues noted on [place] narcotic registry.  Rx printed, signed and placed up front for pick up.

## 2016-12-10 NOTE — Addendum Note (Signed)
Addended by: Lindell SparKIRKMAN, Coury Grieger C on: 12/10/2016 03:16 PM   Modules accepted: Orders

## 2017-01-07 ENCOUNTER — Other Ambulatory Visit: Payer: Self-pay | Admitting: *Deleted

## 2017-01-07 ENCOUNTER — Telehealth: Payer: Self-pay | Admitting: Neurology

## 2017-01-07 MED ORDER — FENTANYL 12 MCG/HR TD PT72: 12.5000 ug | MEDICATED_PATCH | TRANSDERMAL | 0 refills | Status: DC

## 2017-01-07 NOTE — Telephone Encounter (Signed)
Chart reviewed by MD.  Per vo by Dr. Terrace Arabia, provide refill for Fentanyl.  Rx printed, post-dated, signed and placed up front to patient to pick up.

## 2017-01-07 NOTE — Telephone Encounter (Signed)
Pt calling for refill of fentaNYL (DURAGESIC - DOSED MCG/HR) 12 MCG/HR

## 2017-01-11 DIAGNOSIS — Z23 Encounter for immunization: Secondary | ICD-10-CM | POA: Diagnosis not present

## 2017-02-05 ENCOUNTER — Telehealth: Payer: Self-pay | Admitting: Neurology

## 2017-02-05 MED ORDER — FENTANYL 12 MCG/HR TD PT72: 12.5000 ug | MEDICATED_PATCH | TRANSDERMAL | 0 refills | Status: DC

## 2017-02-05 NOTE — Addendum Note (Signed)
Addended by: Lindell SparKIRKMAN, MICHELLE C on: 02/05/2017 04:07 PM   Modules accepted: Orders

## 2017-02-05 NOTE — Telephone Encounter (Signed)
Patient requesting refill of  fentaNYL (DURAGESIC - DOSED MCG/HR) 12 MCG/HR.

## 2017-02-05 NOTE — Telephone Encounter (Signed)
Rx post-date, printed, signed and placed up front for pick up.

## 2017-03-06 ENCOUNTER — Telehealth: Payer: Self-pay

## 2017-03-06 MED ORDER — FENTANYL 12 MCG/HR TD PT72: 12.5000 ug | MEDICATED_PATCH | TRANSDERMAL | 0 refills | Status: DC

## 2017-03-06 NOTE — Telephone Encounter (Signed)
Patient is calling for a refill on her fentanyl. Please call when ready for pick up.

## 2017-03-06 NOTE — Telephone Encounter (Signed)
No issues noted on [place] narcotic registry.  Rx postdated, printed, signed and placed up front for pick up.

## 2017-03-06 NOTE — Addendum Note (Signed)
Addended by: Lindell SparKIRKMAN, MICHELLE C on: 03/06/2017 04:55 PM   Modules accepted: Orders

## 2017-04-05 ENCOUNTER — Encounter: Payer: Self-pay | Admitting: Gastroenterology

## 2017-04-08 ENCOUNTER — Telehealth: Payer: Self-pay | Admitting: Neurology

## 2017-04-08 MED ORDER — FENTANYL 12 MCG/HR TD PT72
12.5000 ug | MEDICATED_PATCH | TRANSDERMAL | 0 refills | Status: DC
Start: 2017-04-08 — End: 2017-05-07

## 2017-04-08 NOTE — Telephone Encounter (Signed)
Pt is requesting a refill for fentaNYL (DURAGESIC - DOSED MCG/HR) 12 MCG/HR

## 2017-04-08 NOTE — Telephone Encounter (Signed)
Appts current, medication due, no issues on  narcotic registry.  Rx printed, signed and placed up front for pick up.

## 2017-05-07 ENCOUNTER — Telehealth: Payer: Self-pay | Admitting: Neurology

## 2017-05-07 MED ORDER — FENTANYL 12 MCG/HR TD PT72: 12.5000 ug | MEDICATED_PATCH | TRANSDERMAL | 0 refills | Status: DC

## 2017-05-07 NOTE — Telephone Encounter (Signed)
Pt request refill for fentaNYL (DURAGESIC - DOSED MCG/HR) 12 MCG/HR °

## 2017-05-07 NOTE — Telephone Encounter (Signed)
No issues noted in [place] narcotic registry.  Patient is due for an appt.  I called and left her a message requesting her to contact our office to schedule with MD or NP.  I will also include a note for the front desk for her to schedule prior to receiving this prescription up front.

## 2017-05-07 NOTE — Telephone Encounter (Signed)
Prescription post-dated, signed and placed up front with a note to schedule an appt prior to pick up.

## 2017-06-07 ENCOUNTER — Other Ambulatory Visit: Payer: Self-pay | Admitting: Neurology

## 2017-06-07 MED ORDER — FENTANYL 12 MCG/HR TD PT72: 12.5000 ug | MEDICATED_PATCH | TRANSDERMAL | 0 refills | Status: DC

## 2017-06-07 NOTE — Telephone Encounter (Signed)
Last written on 05/08/17 for #10 patches. Ok to fill electronically?

## 2017-06-07 NOTE — Telephone Encounter (Signed)
Pt calling for written prescription for her fentaNYL (DURAGESIC - DOSED MCG/HR) 12 MCG/HR

## 2017-06-07 NOTE — Telephone Encounter (Signed)
Rx signed and at the front desk

## 2017-06-07 NOTE — Addendum Note (Signed)
Addended by: Rocky Link A on: 06/07/2017 09:35 AM   Modules accepted: Orders

## 2017-07-08 ENCOUNTER — Other Ambulatory Visit: Payer: Self-pay | Admitting: Neurology

## 2017-07-08 MED ORDER — FENTANYL 12 MCG/HR TD PT72: 12.5000 ug | MEDICATED_PATCH | TRANSDERMAL | 0 refills | Status: DC

## 2017-07-08 NOTE — Addendum Note (Signed)
Addended by: Levert FeinsteinYAN, Tamikka Pilger on: 07/08/2017 04:58 PM   Modules accepted: Orders

## 2017-07-08 NOTE — Telephone Encounter (Signed)
Pt has called for a prescription refill for fentaNYL (DURAGESIC - DOSED MCG/HR) 12 MCG/HR

## 2017-07-08 NOTE — Telephone Encounter (Signed)
[place] registry checked without any issues noted.  Patient current on her appointments. Next follow up 07/16/17. Rx post-dated to 07/12/17.

## 2017-07-08 NOTE — Telephone Encounter (Signed)
I have Rx fentanyl

## 2017-07-08 NOTE — Addendum Note (Signed)
Addended by: Lindell SparKIRKMAN, Jacqulyn Barresi C on: 07/08/2017 02:13 PM   Modules accepted: Orders

## 2017-07-09 ENCOUNTER — Encounter: Payer: Self-pay | Admitting: *Deleted

## 2017-07-09 DIAGNOSIS — G8929 Other chronic pain: Secondary | ICD-10-CM | POA: Insufficient documentation

## 2017-07-09 DIAGNOSIS — M549 Dorsalgia, unspecified: Secondary | ICD-10-CM

## 2017-07-16 ENCOUNTER — Encounter: Payer: Self-pay | Admitting: Neurology

## 2017-07-16 ENCOUNTER — Ambulatory Visit (INDEPENDENT_AMBULATORY_CARE_PROVIDER_SITE_OTHER): Payer: BC Managed Care – PPO | Admitting: Neurology

## 2017-07-16 VITALS — BP 110/58 | HR 65 | Ht 62.0 in | Wt 136.5 lb

## 2017-07-16 DIAGNOSIS — M5440 Lumbago with sciatica, unspecified side: Secondary | ICD-10-CM

## 2017-07-16 DIAGNOSIS — G8929 Other chronic pain: Secondary | ICD-10-CM | POA: Diagnosis not present

## 2017-07-16 MED ORDER — GABAPENTIN 100 MG PO CAPS: 100.0000 mg | ORAL_CAPSULE | Freq: Three times a day (TID) | ORAL | 4 refills | Status: DC

## 2017-07-16 NOTE — Progress Notes (Signed)
[facility]  PATIENT: Morgan Swanson DOB: September 11, 1950  HISTORY OF PRESENT ILLNESS:Morgan Swanson is a 67 years old right-handed African American female, returns to followup for her low back pain, lumbar stenosis,   She had a past medical history of hypertension, hyperlipidemia.     She presented for low back pain since 2008, her low back pain radiating to her right lower extremity, involving right plantar foot, she denies significant weakness, no gait difficulty, she has been treated with fentanyl patch20mcg/q72 hours, which has been very helpful, she only has minimal discomfort. She denies bowel or bladder incontinence.   Most recent MRI lumbar was in 11/2006, which showed grade 1 anterolisthesis L4-5 with moderate to severe facet hypertrophy and moderate to severe central canal stenosis, moderate to severe right foraminal narrowing, right greater than left at L4-5.   Patient has a signed narcotic agreement with this practice. She has been doing her exercises, HEP. She is also taking her gabapentin. She was recently placed trazodone for sleep with good benefit. She is raising her 4 grandchildren. She continues to work full-time. Her work restrictions are no lifting over 20 pounds She returns for reevaluation.   UPDATE September 10 2016: She still has low back pain, also complains of increased numbness, tingling of both legs, right worse than left, mild gait abnormality, right leg feels heavy, no bowel and bladder incontinence She still cooks for Morgan Swanson,  We have personally reviewed MRI lumbar in 2008, grade 1 anterolisthesis of L4-5 with moderate to severe facet hypertrophy and a moderate to severe central canal stenosis, lateral recess stenosis right more than left,  She was found to have mild bilateral toe extension weakness right more than left, length dependent sensory changes, antalgic gait  UPDATE July 16 2017: She has epidural injection twice, last  one was in Sept 2018, which has helped her. She has still numbness at top of right foot, lateral leg, mild unsteady gait, no incontinence,   She was seen by neurosurgeon Morgan Swanson on October 08 2016: Profound stenosis at L4-5 secondary to ligamentum flavum hypertrophy, with the cyst, facet hypertrophy, even worse facet hypertrophy L5-S1,   patient is in significant pain, 6 out of 10, with movement Up to 9 Out Of 10,    She continues to use fentanyl patch.  REVIEW OF SYSTEMS: Full 14 system review of systems performed and notable only for those listed, all others are neg:  As above   ALLERGIES: Allergies  Allergen Reactions  . Codeine   . Penicillins     HOME MEDICATIONS: Outpatient Medications Prior to Visit  Medication Sig Dispense Refill  . amLODipine (NORVASC) 10 MG tablet Take 1 tablet by mouth daily.    . Cholecalciferol (VITAMIN D3) 50000 units CAPS Take 50,000 Units by mouth once a week.    . CRESTOR 20 MG tablet Take 1 tablet by mouth daily.    . fentaNYL (DURAGESIC - DOSED MCG/HR) 12 MCG/HR Place 1 patch (12.5 mcg total) onto the skin every 3 (three) days. Patient has drug agreement 10 patch 0  . gabapentin (NEURONTIN) 100 MG capsule take 3 capsules by mouth at bedtime 90 capsule 11  . lisinopril (PRINIVIL,ZESTRIL) 10 MG tablet Take 10 mg by mouth daily.  0  . meloxicam (MOBIC) 7.5 MG tablet Take 1 tablet (7.5 mg total) by mouth 2 (two) times daily. 60 tablet 11  . omeprazole (PRILOSEC) 40 MG capsule Take 1 capsule by mouth daily.    Marland Kitchen  triamterene-hydrochlorothiazide (MAXZIDE-25) 37.5-25 MG tablet Take 0.5 tablets by mouth daily.  0  . Diclofenac Sodium (PENNSAID) 2 % SOLN APPLY TO AFFECTED AREA 2-3 TIMES PRN 1 Bottle 0  . traMADol (ULTRAM) 50 MG tablet Take 1 tablet (50 mg total) by mouth every 12 (twelve) hours as needed. 30 tablet 2  . traZODone (DESYREL) 50 MG tablet Take 50 mg by mouth. 1/4 to 1/2 tablet at bedtime per pt  0   No facility-administered medications prior to  visit.     PAST MEDICAL HISTORY: Past Medical History:  Diagnosis Date  . Arthritis    back   . Back pain   . Cataract    small immature  . Depression   . GERD (gastroesophageal reflux disease)   . Hyperlipidemia   . Hypertension   . Spinal stenosis, lumbar region, without neurogenic claudication     PAST SURGICAL HISTORY: Past Surgical History:  Procedure Laterality Date  . CHOLECYSTECTOMY    . HAND SURGERY     fatty tissue removed   . TRANSTHORACIC ECHOCARDIOGRAM  12/2001; 09/2016   a.Normal LV size and function. EF 55-65%. Normal regional wall motion. No valvular lesions;; b. October 04, 2016: Normal LV size with mild LV hypertrophy. EF 55-60%. Mildly increased RV size with normal systolic function. No significant valvular abnormalities  . VAGINAL HYSTERECTOMY  1983    FAMILY HISTORY: Family History  Problem Relation Age of Onset  . Breast cancer Mother   . Diabetes Mother   . Fibromyalgia Mother   . Lung cancer Father   . Stroke Brother   . Colon cancer Neg Hx   . Colon polyps Neg Hx   . Esophageal cancer Neg Hx   . Rectal cancer Neg Hx   . Stomach cancer Neg Hx     SOCIAL HISTORY: Social History   Socioeconomic History  . Marital status: Single    Spouse name: Not on file  . Number of children: 2  . Years of education: HS  . Highest education level: Not on file  Occupational History  . Occupation: Multimedia programmer: [employer]  Social Needs  . Financial resource strain: Not on file  . Food insecurity:    Worry: Not on file    Inability: Not on file  . Transportation needs:    Medical: Not on file    Non-medical: Not on file  Tobacco Use  . Smoking status: Former Smoker    Last attempt to quit: 04/03/1999    Years since quitting: 18.2  . Smokeless tobacco: Never Used  Substance and Sexual Activity  . Alcohol use: Yes    Alcohol/week: 0.6 oz    Types: 1 Glasses of wine per week    Comment: occ Holidays  . Drug use: No  . Sexual  activity: Not on file  Lifestyle  . Physical activity:    Days per week: Not on file    Minutes per session: Not on file  . Stress: Not on file  Relationships  . Social connections:    Talks on phone: Not on file    Gets together: Not on file    Attends religious service: Not on file    Active member of club or organization: Not on file    Attends meetings of clubs or organizations: Not on file    Relationship status: Not on file  . Intimate partner violence:    Fear of current or ex partner: Not on file  Emotionally abused: Not on file    Physically abused: Not on file    Forced sexual activity: Not on file  Other Topics Concern  . Not on file  Social History Narrative   Pt lives at home with her four teenage grandchildren - has legal custody.    Patient is single.   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   Caffeine Use- 2 cups of tea daily.   Right handed.    PHYSICAL EXAMNIATION:  Gen: NAD, conversant, well nourised, obese, well groomed                     Cardiovascular: Regular rate rhythm, no peripheral edema, warm, nontender. Eyes: Conjunctivae clear without exudates or hemorrhage Neck: Supple, no carotid bruits. Pulmonary: Clear to auscultation bilaterally   NEUROLOGICAL EXAM:  MENTAL STATUS: Speech:    Speech is normal; fluent and spontaneous with normal comprehension.  Cognition:     Orientation to time, place and person     Normal recent and remote memory     Normal Attention span and concentration     Normal Language, naming, repeating,spontaneous speech     Fund of knowledge   CRANIAL NERVES: CN II: Visual fields are full to confrontation. Fundoscopic exam is normal with sharp discs and no vascular changes. Pupils are round equal and briskly reactive to light. CN III, IV, VI: extraocular movement are normal. No ptosis. CN V: Facial sensation is intact to pinprick in all 3 divisions bilaterally. Corneal responses are intact.  CN VII:  Face is symmetric with normal eye closure and smile. CN VIII: Hearing is normal to rubbing fingers CN IX, X: Palate elevates symmetrically. Phonation is normal. CN XI: Head turning and shoulder shrug are intact CN XII: Tongue is midline with normal movements and no atrophy.  MOTOR: She has mild bilateral toe extension weakness, right worse than left,   REFLEXES: Reflexes are 2+ and symmetric at the biceps, triceps, knees, and absent at ankles. Plantar responses are flexor.  SENSORY: Intact to light touch, pinprick, positional and vibratory sensation are intact in fingers and toes.  COORDINATION: Rapid alternating movements and fine finger movements are intact. There is no dysmetria on finger-to-nose and heel-knee-shin.    GAIT/STANCE: Antalgic gait, mild difficulty with right heel walking   DIAGNOSTIC DATA (LABS, IMAGING, TESTING) -  ASSESSMENT AND PLAN 67 y.o. year old female Lumbar radiculopathy, worsening bilateral lower extremity paresthesia, mild weakness, chronic pain  Improved with epidural injection, last was in Sept 2018.  Refill her medication fentanyl patch 12.5 mg 10 patch each months, as needed Mobic, gabapentin 100 mg 3 times a day  Morgan Swanson, M.D. Ph.D.  [facility] [address] [place], [place] [address] Phone: [phone] Fax:      [phone]

## 2017-07-17 LAB — 733690 12+OXYCODONE+CRT-SCR
Amphetamine Scrn, Ur: NEGATIVE ng/mL
BARBITURATE SCREEN URINE: NEGATIVE ng/mL
BENZODIAZEPINE SCREEN, URINE: NEGATIVE ng/mL
CANNABINOIDS UR QL SCN: NEGATIVE ng/mL
CREATININE(CRT), U: 102.6 mg/dL (ref 20.0–300.0)
Cocaine (Metab) Scrn, Ur: NEGATIVE ng/mL
Fentanyl, Urine: POSITIVE pg/mL — AB
MEPERIDINE SCREEN, URINE: NEGATIVE ng/mL
METHADONE SCREEN, URINE: NEGATIVE ng/mL
OPIATE SCREEN URINE: NEGATIVE ng/mL
OXYCODONE+OXYMORPHONE UR QL SCN: NEGATIVE ng/mL
Ph of Urine: 5.8 (ref 4.5–8.9)
Phencyclidine Qn, Ur: NEGATIVE ng/mL
Propoxyphene Scrn, Ur: NEGATIVE ng/mL
TRAMADOL SCREEN, URINE: NEGATIVE ng/mL

## 2017-08-08 ENCOUNTER — Other Ambulatory Visit: Payer: Self-pay | Admitting: Neurology

## 2017-08-08 MED ORDER — FENTANYL 12 MCG/HR TD PT72: 12.5000 ug | MEDICATED_PATCH | TRANSDERMAL | 0 refills | Status: DC

## 2017-08-08 NOTE — Telephone Encounter (Signed)
Pt request refill for fentaNYL (DURAGESIC - DOSED MCG/HR) 12 MCG/HR sent to [facility]/[address] °

## 2017-08-08 NOTE — Telephone Encounter (Signed)
[place] narcotic registry checked without any issues noted.  Rx postdate and sent to Dr. Terrace Arabia for approval.

## 2017-08-09 ENCOUNTER — Telehealth: Payer: Self-pay | Admitting: *Deleted

## 2017-08-09 NOTE — Telephone Encounter (Addendum)
Initiated PA on CMM. The plan will fax you a determination, typically within 1 to 5 business days.

## 2017-08-15 NOTE — Telephone Encounter (Signed)
Patient returned call. She stated she picked up Fentanyl patches disp #10 for 30 days yesterday. She stated that she wasn't due to pick it up until 08/14/17. This RN thanked her for calling, informed her that per CMM, the PA was NA. Advised she call for any problems. Patient verbalized understanding,appreciaiton.

## 2017-08-15 NOTE — Telephone Encounter (Signed)
LVM requesting call back, re: Fentanyl patches.

## 2017-09-08 ENCOUNTER — Other Ambulatory Visit: Payer: Self-pay | Admitting: Neurology

## 2017-09-10 ENCOUNTER — Other Ambulatory Visit: Payer: Self-pay | Admitting: Neurology

## 2017-09-10 MED ORDER — FENTANYL 12 MCG/HR TD PT72: 12.5000 ug | MEDICATED_PATCH | TRANSDERMAL | 0 refills | Status: DC

## 2017-09-10 NOTE — Telephone Encounter (Signed)
Patient current on appts.  [place] narcotic registry checked without any issues noted.  Rx due.

## 2017-09-10 NOTE — Telephone Encounter (Signed)
Pt requesting refills for fentaNYL (DURAGESIC - DOSED MCG/HR) 12 [facility]CG[facility]

## 2017-09-23 ENCOUNTER — Encounter: Payer: Self-pay | Admitting: *Deleted

## 2017-09-23 ENCOUNTER — Other Ambulatory Visit: Payer: Self-pay | Admitting: Neurology

## 2017-10-10 ENCOUNTER — Other Ambulatory Visit: Payer: Self-pay | Admitting: Neurology

## 2017-10-10 MED ORDER — FENTANYL 12 MCG/HR TD PT72: 12.5000 ug | MEDICATED_PATCH | TRANSDERMAL | 0 refills | Status: DC

## 2017-10-10 NOTE — Telephone Encounter (Signed)
Pt request refill for fentaNYL (DURAGESIC - DOSED MCG/HR) 12 [facility]CG[facility]

## 2017-10-10 NOTE — Telephone Encounter (Signed)
Please send Fentanyl to pharmacy for the patient.  No issues noted on the [place] narcotic registry.  Prescription is due.  The patient is current on her appointments.

## 2017-10-23 ENCOUNTER — Other Ambulatory Visit: Payer: Self-pay | Admitting: Neurology

## 2017-11-12 ENCOUNTER — Other Ambulatory Visit: Payer: Self-pay | Admitting: Neurology

## 2017-11-12 MED ORDER — FENTANYL 12 MCG/HR TD PT72: 12.5000 ug | MEDICATED_PATCH | TRANSDERMAL | 0 refills | Status: DC

## 2017-11-12 NOTE — Telephone Encounter (Signed)
Pt request refill for fentaNYL (DURAGESIC - DOSED MCG/HR) 12 MCG/HR sent to [facility]

## 2017-11-12 NOTE — Telephone Encounter (Signed)
Patient is current on appointments.  [place] narcotic registry checked without any issues. Refill is due.

## 2017-12-11 ENCOUNTER — Other Ambulatory Visit: Payer: Self-pay | Admitting: Neurology

## 2017-12-11 MED ORDER — FENTANYL 12 MCG/HR TD PT72: 12.5000 ug | MEDICATED_PATCH | TRANSDERMAL | 0 refills | Status: DC

## 2017-12-11 NOTE — Telephone Encounter (Signed)
Patient's current on appts and no issues noted on [place] narcotic registry.  Refill postdated and sent to Dr.Yan for approval.

## 2017-12-11 NOTE — Telephone Encounter (Signed)
Pt has called for a refill on her fentaNYL (DURAGESIC - DOSED MCG/HR) 12 MCG/HR  [facility] [phone] - [place], [place] - [phone] AT [facility] OF [address] [phone] (Phone) [phone] (Fax)

## 2017-12-11 NOTE — Addendum Note (Signed)
Addended by: Lindell Spar C on: 12/11/2017 02:32 PM   Modules accepted: Orders

## 2018-01-08 ENCOUNTER — Other Ambulatory Visit: Payer: Self-pay | Admitting: Neurology

## 2018-01-08 MED ORDER — FENTANYL 12 MCG/HR TD PT72: 12.5000 ug | MEDICATED_PATCH | TRANSDERMAL | 0 refills | Status: DC

## 2018-01-08 NOTE — Telephone Encounter (Signed)
Pt current on appts and no issues noted in [place] narcotic registry.  Prescription has been post-dated and sent to Dr. Terrace Arabia for review.  She will send it to the pharmacy.

## 2018-01-08 NOTE — Addendum Note (Signed)
Addended by: Lindell Spar C on: 01/08/2018 11:17 AM   Modules accepted: Orders

## 2018-01-08 NOTE — Telephone Encounter (Signed)
Pt has called for a refill on her fentaNYL (DURAGESIC - DOSED MCG/HR) 12 MCG/HR please send to  [id number] - [place], [place] - [address] AT [facility] [address] [phone] (Phone) [phone] (Fax)

## 2018-02-06 ENCOUNTER — Telehealth: Payer: Self-pay | Admitting: Neurology

## 2018-02-06 ENCOUNTER — Other Ambulatory Visit: Payer: Self-pay | Admitting: *Deleted

## 2018-02-06 MED ORDER — FENTANYL 12 MCG/HR TD PT72: 12.5000 ug | MEDICATED_PATCH | TRANSDERMAL | 0 refills | Status: DC

## 2018-02-06 NOTE — Telephone Encounter (Signed)
Pt has called for a refill on fentaNYL (DURAGESIC - DOSED MCG/HR) 12 MCG/HR  [facility] [phone] - [place], [place] - [phone]

## 2018-02-06 NOTE — Telephone Encounter (Signed)
No issues noted in [place] narcotic registry.  Patient's appts are up-to-date.  Post-dated request for refill sent to Dr. Terrace Arabia for approval.

## 2018-03-03 ENCOUNTER — Other Ambulatory Visit: Payer: Self-pay | Admitting: Neurology

## 2018-03-10 ENCOUNTER — Telehealth: Payer: Self-pay | Admitting: Neurology

## 2018-03-10 MED ORDER — FENTANYL 12 MCG/HR TD PT72: 12.5000 ug | MEDICATED_PATCH | TRANSDERMAL | 0 refills | Status: DC

## 2018-03-10 NOTE — Telephone Encounter (Signed)
Refilled fentanyl patch 12.5 mcg for 10 patch on Dec 9th 2019

## 2018-03-10 NOTE — Addendum Note (Signed)
Addended by: Levert FeinsteinYAN, Meshelle Holness on: 03/10/2018 05:01 PM   Modules accepted: Orders

## 2018-03-10 NOTE — Telephone Encounter (Signed)
Pt asking for a refill on fentaNYL (DURAGESIC - DOSED MCG/HR) 12 MCG/HR [facility] [phone]

## 2018-04-08 ENCOUNTER — Other Ambulatory Visit: Payer: Self-pay | Admitting: Neurology

## 2018-04-08 MED ORDER — FENTANYL 12 MCG/HR TD PT72: 12.5000 ug | MEDICATED_PATCH | TRANSDERMAL | 0 refills | Status: DC

## 2018-04-08 NOTE — Telephone Encounter (Signed)
Pt is needing a refill on her fentaNYL (DURAGESIC - DOSED MCG/HR) 12 MCG/HR [facility] in [facility] [address]

## 2018-04-08 NOTE — Telephone Encounter (Signed)
[place] narcotic registry checked.  No issues noted. 

## 2018-05-09 ENCOUNTER — Other Ambulatory Visit: Payer: Self-pay | Admitting: Neurology

## 2018-05-09 MED ORDER — FENTANYL 12 MCG/HR TD PT72: 1.0000 | MEDICATED_PATCH | TRANSDERMAL | 0 refills | Status: DC

## 2018-05-09 NOTE — Telephone Encounter (Signed)
[place] narcotic registry checked without any issues noted.  Patient is current on appointments.  Refill request send to MD for approval.

## 2018-05-09 NOTE — Telephone Encounter (Signed)
Pt is needing a refill on her fentaNYL (DURAGESIC - DOSED MCG/HR) 12 MCG/HR sent to [facility] [address].

## 2018-06-09 ENCOUNTER — Other Ambulatory Visit: Payer: Self-pay | Admitting: Neurology

## 2018-06-09 MED ORDER — FENTANYL 12 MCG/HR TD PT72: 1.0000 | MEDICATED_PATCH | TRANSDERMAL | 0 refills | Status: DC

## 2018-06-09 NOTE — Telephone Encounter (Signed)
Pt is needing a refill for her fentaNYL (DURAGESIC) 12 MCG/HR sent to [facility] on [address]

## 2018-07-03 ENCOUNTER — Telehealth: Payer: Self-pay | Admitting: *Deleted

## 2018-07-03 NOTE — Telephone Encounter (Signed)
Rec'd fmla form. Need payment for form.  

## 2018-07-08 ENCOUNTER — Other Ambulatory Visit: Payer: Self-pay | Admitting: Neurology

## 2018-07-08 MED ORDER — FENTANYL 12 MCG/HR TD PT72: 1.0000 | MEDICATED_PATCH | TRANSDERMAL | 0 refills | Status: DC

## 2018-07-08 NOTE — Telephone Encounter (Signed)
Pt called in for a refill of the fentaNYL (DURAGESIC) 12 MCG/HR to be sent to [facility] [address]

## 2018-07-21 ENCOUNTER — Ambulatory Visit: Payer: BC Managed Care – PPO | Admitting: Nurse Practitioner

## 2018-07-21 ENCOUNTER — Encounter: Payer: Self-pay | Admitting: Neurology

## 2018-07-21 ENCOUNTER — Ambulatory Visit (INDEPENDENT_AMBULATORY_CARE_PROVIDER_SITE_OTHER): Payer: BC Managed Care – PPO | Admitting: Neurology

## 2018-07-21 ENCOUNTER — Other Ambulatory Visit: Payer: Self-pay

## 2018-07-21 DIAGNOSIS — G8929 Other chronic pain: Secondary | ICD-10-CM

## 2018-07-21 DIAGNOSIS — M5440 Lumbago with sciatica, unspecified side: Secondary | ICD-10-CM | POA: Diagnosis not present

## 2018-07-21 MED ORDER — GABAPENTIN 100 MG PO CAPS: 100.0000 mg | ORAL_CAPSULE | Freq: Three times a day (TID) | ORAL | 4 refills | Status: DC

## 2018-07-21 NOTE — Progress Notes (Signed)
[facility]  PATIENT: Morgan Swanson DOB: December 17, 1950  HISTORY OF PRESENT ILLNESS:Ms. Morgan Swanson is a 68 years old right-handed African American female, returns to followup for her low back pain, lumbar stenosis,   She had a past medical history of hypertension, hyperlipidemia.     She presented for low back pain since 2008, her low back pain radiating to her right lower extremity, involving right plantar foot, she denies significant weakness, no gait difficulty, she has been treated with fentanyl patch53mcg/q72 hours, which has been very helpful, she only has minimal discomfort. She denies bowel or bladder incontinence.   Most recent MRI lumbar was in 11/2006, which showed grade 1 anterolisthesis L4-5 with moderate to severe facet hypertrophy and moderate to severe central canal stenosis, moderate to severe right foraminal narrowing, right greater than left at L4-5.   Patient has a signed narcotic agreement with this practice. She has been doing her exercises, HEP. She is also taking her gabapentin. She was recently placed trazodone for sleep with good benefit. She is raising her 4 grandchildren. She continues to work full-time. Her work restrictions are no lifting over 20 pounds She returns for reevaluation.   UPDATE September 10 2016: She still has low back pain, also complains of increased numbness, tingling of both legs, right worse than left, mild gait abnormality, right leg feels heavy, no bowel and bladder incontinence She still cooks for [employer],  We have personally reviewed MRI lumbar in 2008, grade 1 anterolisthesis of L4-5 with moderate to severe facet hypertrophy and a moderate to severe central canal stenosis, lateral recess stenosis right more than left,  She was found to have mild bilateral toe extension weakness right more than left, length dependent sensory changes, antalgic gait  UPDATE July 16 2017: She has epidural injection twice, last  one was in Sept 2018, which has helped her. She has still numbness at top of right foot, lateral leg, mild unsteady gait, no incontinence,   She was seen by neurosurgeon Dr. 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  Virtual Visit via Video  I connected with Sharne C Misuraca on 07/21/18 at  by video and verified that I am speaking with the correct person using two identifiers.   I discussed the limitations, risks, security and privacy concerns of performing an evaluation and management service by video and the availability of in person appointments. I also discussed with the patient that there may be a patient responsible charge related to this service. The patient expressed understanding and agreed to proceed.   History of Present Illness: She still has chronic low back pain, getting fentanyl 12mg /hr every 72  Hours, also gabapentin 100mg  tid, she is ok with current medications.  She still has significant right leg paresthesia, was receiving epidural injection intermittently   Observations/Objective: I have reviewed problem lists, medications, allergies.  Awake alert oriented to history taking and casual conversation, ambulate without difficulty  Assessment and Plan: Chronic low back pain, right leg paresthesia  Refilled her gabapentin 100 mg 3 times daily,  She does not want to consider surgical option, is receiving fentanyl 12 mcg/hr every 72 hours  Continue back stretching exercise, heating pad Follow Up Instructions:  1 year  I discussed the assessment and treatment plan with the patient. The patient was provided an opportunity to ask questions and all were answered. The patient agreed with the plan and demonstrated an understanding of the  instructions.   The patient was advised to call back or seek an in-person evaluation if the symptoms worsen or if the condition fails to improve as anticipated.  I provided 15 minutes of non-face-to-face time during this encounter.   Levert FeinsteinYijun Doral Digangi, MD  REVIEW OF SYSTEMS: Full 14 system review of systems performed and notable only for those listed, all others are neg:  As above

## 2018-08-06 ENCOUNTER — Other Ambulatory Visit: Payer: Self-pay | Admitting: Neurology

## 2018-08-06 MED ORDER — FENTANYL 12 MCG/HR TD PT72: 1.0000 | MEDICATED_PATCH | TRANSDERMAL | 0 refills | Status: DC

## 2018-08-06 NOTE — Telephone Encounter (Signed)
Patient calling requesting refill of fentaNYL (DURAGESIC) 12 MCG/HR to be [facility] [address]

## 2018-08-06 NOTE — Addendum Note (Signed)
Addended by: Lindell Spar C on: 08/06/2018 01:43 PM   Modules accepted: Orders

## 2018-08-06 NOTE — Telephone Encounter (Signed)
[place] narcotic registry checked without any issues noted (last filled 07/10/2018). She is up-to-date with her appts.  Rx post-dated and sent to Dr. Terrace Arabia for approval.

## 2018-08-27 ENCOUNTER — Other Ambulatory Visit: Payer: Self-pay | Admitting: Neurology

## 2018-09-08 ENCOUNTER — Other Ambulatory Visit: Payer: Self-pay | Admitting: Neurology

## 2018-09-08 ENCOUNTER — Telehealth: Payer: Self-pay | Admitting: Neurology

## 2018-09-08 MED ORDER — FENTANYL 12 MCG/HR TD PT72: 1.0000 | MEDICATED_PATCH | TRANSDERMAL | 0 refills | Status: DC

## 2018-09-08 NOTE — Telephone Encounter (Signed)
Pt called in for a refill of fentaNYL (DURAGESIC) 12 MCG/HR to be sent to [facility] [place], [address] AT [address]

## 2018-09-08 NOTE — Telephone Encounter (Signed)
I have routed this request to Dr Yan for review. The pt is due for the medication and [place] registry was verified.  

## 2018-10-07 ENCOUNTER — Other Ambulatory Visit: Payer: Self-pay | Admitting: Neurology

## 2018-10-07 MED ORDER — FENTANYL 12 MCG/HR TD PT72: 1.0000 | MEDICATED_PATCH | TRANSDERMAL | 0 refills | Status: DC

## 2018-10-07 NOTE — Telephone Encounter (Signed)
Pt has called for a refill on her fentaNYL ([place]) 12 MCG/HR [facility] [phone]

## 2018-10-07 NOTE — Telephone Encounter (Signed)
[place] narcotic registry checked.  No issues noted.

## 2018-10-07 NOTE — Addendum Note (Signed)
Addended by: Noberto Retort C on: 10/07/2018 11:08 AM   Modules accepted: Orders

## 2018-10-30 DIAGNOSIS — H40033 Anatomical narrow angle, bilateral: Secondary | ICD-10-CM | POA: Diagnosis not present

## 2018-10-30 DIAGNOSIS — H2513 Age-related nuclear cataract, bilateral: Secondary | ICD-10-CM | POA: Diagnosis not present

## 2018-11-10 ENCOUNTER — Telehealth: Payer: Self-pay | Admitting: Neurology

## 2018-11-10 ENCOUNTER — Other Ambulatory Visit: Payer: Self-pay | Admitting: Neurology

## 2018-11-10 DIAGNOSIS — Z803 Family history of malignant neoplasm of breast: Secondary | ICD-10-CM | POA: Diagnosis not present

## 2018-11-10 DIAGNOSIS — Z1231 Encounter for screening mammogram for malignant neoplasm of breast: Secondary | ICD-10-CM | POA: Diagnosis not present

## 2018-11-10 NOTE — Telephone Encounter (Signed)
Pt is needing a refill on her fentaNYL (DURAGESIC) 12 MCG/HR sent to the [facility] on [address]

## 2018-11-10 NOTE — Telephone Encounter (Signed)
No issues noted in [place] narcotic registry.  Refill due and sent to MD for approval.

## 2018-11-10 NOTE — Telephone Encounter (Signed)
This request has been sent to Dr. Krista Blue for approval.

## 2018-11-10 NOTE — Telephone Encounter (Signed)
Pt is needing a refill on her fentaNYL (DURAGESIC) 12 MCG/HR sent to the [facility] on [address] °

## 2018-11-11 MED ORDER — FENTANYL 12 MCG/HR TD PT72: 1.0000 | MEDICATED_PATCH | TRANSDERMAL | 0 refills | Status: DC

## 2018-12-10 ENCOUNTER — Other Ambulatory Visit: Payer: Self-pay | Admitting: Neurology

## 2018-12-10 MED ORDER — FENTANYL 12 MCG/HR TD PT72: 1.0000 | MEDICATED_PATCH | TRANSDERMAL | 0 refills | Status: DC

## 2018-12-10 NOTE — Telephone Encounter (Signed)
Pt is needing a refill on fentaNYL (DURAGESIC) 12 MCG/HR sent to the [facility] on [address]

## 2019-01-07 ENCOUNTER — Other Ambulatory Visit: Payer: Self-pay | Admitting: Neurology

## 2019-01-07 MED ORDER — FENTANYL 12 MCG/HR TD PT72: 1.0000 | MEDICATED_PATCH | TRANSDERMAL | 0 refills | Status: DC

## 2019-01-07 NOTE — Telephone Encounter (Signed)
Pt has called for a refill on her fentaNYL ([place]) 12 MCG/HR [facility] [phone]

## 2019-01-07 NOTE — Addendum Note (Signed)
Addended by: Florian Buff C on: 01/07/2019 11:34 AM   Modules accepted: Orders

## 2019-02-04 ENCOUNTER — Other Ambulatory Visit: Payer: Self-pay | Admitting: Neurology

## 2019-02-06 ENCOUNTER — Other Ambulatory Visit: Payer: Self-pay | Admitting: Neurology

## 2019-02-06 NOTE — Telephone Encounter (Signed)
Pt called needing a refill on her fentaNYL (DURAGESIC) 12 MCG/HR sent to the [place] [facility] on [address].

## 2019-02-06 NOTE — Telephone Encounter (Signed)
Pt current on appts.  [place] narcotic registry checked without any issues notes.  Last filled on 01/10/2019.  Refill will be sent to MD for approval.

## 2019-02-09 MED ORDER — FENTANYL 12 MCG/HR TD PT72: 1.0000 | MEDICATED_PATCH | TRANSDERMAL | 0 refills | Status: DC

## 2019-03-06 ENCOUNTER — Other Ambulatory Visit: Payer: Self-pay | Admitting: Neurology

## 2019-03-06 NOTE — Addendum Note (Signed)
Addended by: Noberto Retort C on: 03/06/2019 12:36 PM   Modules accepted: Orders

## 2019-03-06 NOTE — Telephone Encounter (Signed)
Pt has called for a refill on her fentaNYL (DURAGESIC) 12 MCG/HR °[id number]  °

## 2019-03-06 NOTE — Telephone Encounter (Signed)
[place] narcotic registry checked.  No issues noted.  Pt current on appts.  Last rx filled on 02/09/2019.  Post-dated refill request will be sent to MD for approval.

## 2019-03-09 MED ORDER — FENTANYL 12 MCG/HR TD PT72: 1.0000 | MEDICATED_PATCH | TRANSDERMAL | 0 refills | Status: DC

## 2019-04-08 ENCOUNTER — Other Ambulatory Visit: Payer: Self-pay | Admitting: Neurology

## 2019-04-08 MED ORDER — FENTANYL 12 MCG/HR TD PT72: 1.0000 | MEDICATED_PATCH | TRANSDERMAL | 0 refills | Status: DC

## 2019-04-08 NOTE — Telephone Encounter (Signed)
Pt is needing a refill on her fentaNYL (DURAGESIC) 12 MCG/HR sent in to the [facility] on [address].

## 2019-04-26 ENCOUNTER — Ambulatory Visit: Payer: BC Managed Care – PPO | Attending: Internal Medicine

## 2019-04-26 DIAGNOSIS — Z23 Encounter for immunization: Secondary | ICD-10-CM | POA: Insufficient documentation

## 2019-04-27 NOTE — Progress Notes (Signed)
   Covid-19 Vaccination Clinic  Name:  Morgan Swanson    MRN: [id number] DOB: 09-02-1950  04/26/2019  Morgan Swanson was observed post Covid-19 immunization for 15 minutes without incidence. She was provided with Vaccine Information Sheet and instruction to access the V-Safe system.   Morgan Swanson was instructed to call 911 with any severe reactions post vaccine: Marland Kitchen Difficulty breathing  . Swelling of your face and throat  . A fast heartbeat  . A bad rash all over your body  . Dizziness and weakness    Immunizations Administered    Name Date Dose VIS Date Route   Moderna COVID-19 Vaccine 04/26/2019  3:23 PM 0.5 mL 03/03/2019 Intramuscular   Manufacturer: Gala Murdoch   Lot: [id number]   NDC: 86754-492-01      Documented on behalf of: C. Jimmey Ralph

## 2019-05-07 ENCOUNTER — Other Ambulatory Visit: Payer: Self-pay

## 2019-05-07 MED ORDER — FENTANYL 12 MCG/HR TD PT72: 1.0000 | MEDICATED_PATCH | TRANSDERMAL | 0 refills | Status: DC

## 2019-05-07 NOTE — Telephone Encounter (Signed)
1) Medication(s) Requested (by name): fentaNYL (DURAGESIC) 12 MCG/HR   2) Pharmacy of Choice: [facility] [phone] - [place], [place] - [address] AT [facility] OF [address]  [address] [place] [address]

## 2019-05-24 ENCOUNTER — Ambulatory Visit: Payer: BC Managed Care – PPO | Attending: Internal Medicine

## 2019-05-24 DIAGNOSIS — Z23 Encounter for immunization: Secondary | ICD-10-CM

## 2019-05-24 NOTE — Progress Notes (Signed)
   Covid-19 Vaccination Clinic  Name:  Morgan Swanson    MRN: [id number] DOB: 1950-12-24  05/24/2019  Morgan Swanson was observed post Covid-19 immunization for 15 minutes without incidence. She was provided with Vaccine Information Sheet and instruction to access the V-Safe system.   Morgan Swanson was instructed to call 911 with any severe reactions post vaccine: Marland Kitchen Difficulty breathing  . Swelling of your face and throat  . A fast heartbeat  . A bad rash all over your body  . Dizziness and weakness    Immunizations Administered    Name Date Dose VIS Date Route   Moderna COVID-19 Vaccine 05/24/2019  3:09 PM 0.5 mL 03/03/2019 Intramuscular   Manufacturer: Moderna   Lot: [id number]   NDC: 89381-017-51

## 2019-06-04 ENCOUNTER — Telehealth: Payer: Self-pay | Admitting: Neurology

## 2019-06-04 MED ORDER — FENTANYL 12 MCG/HR TD PT72: 1.0000 | MEDICATED_PATCH | TRANSDERMAL | 0 refills | Status: DC

## 2019-06-04 NOTE — Telephone Encounter (Signed)
Pt is needing a refill on her fentaNYL (DURAGESIC) 12 MCG/HR sent in to the [facility] on [address] 

## 2019-06-09 NOTE — Telephone Encounter (Signed)
I called the pharmacy and they verified the prescription had been received. Per [place] narcotic registry, the last refill was on 05/11/19. The prescription was sent post-dated for 06/10/19. She went to the pharmacy for pick up too soon. I called and explained this situation to her. She will go back to the pharmacy on the due date.

## 2019-06-09 NOTE — Telephone Encounter (Signed)
Pt has called to report that she checked with her pharmacy today and they have not received the refill request for her fentaNYL (DURAGESIC) 12 MCG/HR

## 2019-07-08 ENCOUNTER — Other Ambulatory Visit: Payer: Self-pay | Admitting: Neurology

## 2019-07-08 MED ORDER — FENTANYL 12 MCG/HR TD PT72: 1.0000 | MEDICATED_PATCH | TRANSDERMAL | 0 refills | Status: DC

## 2019-07-08 NOTE — Telephone Encounter (Signed)
1) Medication(s) Requested (by name): fentaNYL (DURAGESIC) 12 MCG/HR   2) Pharmacy of Choice: [facility] [phone] - [place], [place] - [address] AT [facility] OF [address]  [address] [place] [address]

## 2019-07-22 ENCOUNTER — Other Ambulatory Visit: Payer: Self-pay

## 2019-07-22 ENCOUNTER — Encounter: Payer: Self-pay | Admitting: Neurology

## 2019-07-22 ENCOUNTER — Ambulatory Visit (INDEPENDENT_AMBULATORY_CARE_PROVIDER_SITE_OTHER): Payer: BC Managed Care – PPO | Admitting: Neurology

## 2019-07-22 VITALS — BP 134/78 | HR 65 | Temp 96.9°F | Ht 62.0 in | Wt 137.0 lb

## 2019-07-22 DIAGNOSIS — G8929 Other chronic pain: Secondary | ICD-10-CM | POA: Diagnosis not present

## 2019-07-22 DIAGNOSIS — M5440 Lumbago with sciatica, unspecified side: Secondary | ICD-10-CM

## 2019-07-22 MED ORDER — GABAPENTIN 100 MG PO CAPS: 100.0000 mg | ORAL_CAPSULE | Freq: Three times a day (TID) | ORAL | 4 refills | Status: DC

## 2019-07-22 MED ORDER — MELOXICAM 15 MG PO TABS: 7.5000 mg | ORAL_TABLET | Freq: Two times a day (BID) | ORAL | 5 refills | Status: DC

## 2019-07-22 MED ORDER — BACLOFEN 10 MG PO TABS: 10.0000 mg | ORAL_TABLET | Freq: Every evening | ORAL | 1 refills | Status: DC | PRN

## 2019-07-22 NOTE — Progress Notes (Signed)
PATIENT: Morgan Swanson DOB: Nov 06, 1950  REASON FOR VISIT: follow up HISTORY FROM: patient  HISTORY OF PRESENT ILLNESS: Today 07/22/19  HISTORY HISTORY OF PRESENT ILLNESS:Morgan Swanson is a 69 years old right-handed African American female, returns to followup for her low back pain, lumbar stenosis,   She had a past medical history of hypertension, hyperlipidemia.     She presented for low back pain since 2008, her low back pain radiating to her right lower extremity, involving right plantar foot, she denies significant weakness, no gait difficulty, she has been treated with fentanyl patch23mcg/q72 hours, which has been very helpful, she only has minimal discomfort. She denies bowel or bladder incontinence.   Most recent MRI lumbar was in 11/2006, which showed grade 1 anterolisthesis L4-5 with moderate to severe facet hypertrophy and moderate to severe central canal stenosis, moderate to severe right foraminal narrowing, right greater than left at L4-5.   Patient has a signed narcotic agreement with this practice. She has been doing her exercises, HEP. She is also taking her gabapentin. She was recently placed trazodone for sleep with good benefit. She is raising her 4 grandchildren. She continues to work full-time. Her work restrictions are no lifting over 20 pounds She returns for reevaluation.   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 She has still numbness at top of right foot, lateral leg, mild unsteady gait, no incontinence,   She was seen by neurosurgeon Dr. 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  07/21/2018 Dr. Terrace Arabia Virtual Visit via Video History of Present Illness: She still has chronic low back pain, getting fentanyl 12mg /hr every 72  Hours, also gabapentin 100mg  tid, she is ok with current medications.  She still has significant right leg paresthesia, was receiving epidural injection intermittently   Update July 22, 2019 SS: She has chronic low back pain, receiving fentanyl patch 12 mcg/hr every 3 days, gabapentin 100 mg 3 times daily, meloxicam 7.5 mg twice a day.  Pain is overall well controlled.  Since going back to work in August, wearing compression socks for swelling, is trying to be better about sitting down to rest.  Reports during the night, muscle spasms and cramps in her legs, mostly in the right leg, when turning over.  Needs handicap sticker completed.  REVIEW OF SYSTEMS: Out of a complete 14 system review of symptoms, the patient complains only of the following symptoms, and all other reviewed systems are negative.  Back pain  ALLERGIES: Allergies  Allergen Reactions  . Codeine   . Penicillins     HOME MEDICATIONS: Outpatient  Medications Prior to Visit  Medication Sig Dispense Refill  . amLODipine (NORVASC) 10 MG tablet Take 1 tablet by mouth daily.    . Cholecalciferol (VITAMIN D3) 50000 units CAPS Take 50,000 Units by mouth once a week.    . CRESTOR 20 MG tablet Take 1 tablet by mouth daily.    . fentaNYL (DURAGESIC) 12  MCG/HR Place 1 patch onto the skin every 3 (three) days. Patient has drug agreement, 10 patch 0  . lisinopril (PRINIVIL,ZESTRIL) 10 MG tablet Take 10 mg by mouth daily.  0  . omeprazole (PRILOSEC) 40 MG capsule Take 1 capsule by mouth daily.    Marland Kitchen triamterene-hydrochlorothiazide (MAXZIDE-25) 37.5-25 MG tablet Take 0.5 tablets by mouth daily.  0  . gabapentin (NEURONTIN) 100 MG capsule Take 1 capsule (100 mg total) by mouth 3 (three) times daily. 270 capsule 4  . meloxicam (MOBIC) 15 MG tablet TAKE 1/2 TABLET BY MOUTH TWICE DAILY 30 tablet 5   No facility-administered medications prior to visit.    PAST MEDICAL HISTORY: Past Medical History:  Diagnosis Date  . Arthritis    back   . Back pain   . Cataract    small immature  . Depression   . GERD (gastroesophageal reflux disease)   . Hyperlipidemia   . Hypertension   . Spinal stenosis, lumbar region, without neurogenic claudication     PAST SURGICAL HISTORY: Past Surgical History:  Procedure Laterality Date  . CHOLECYSTECTOMY    . HAND SURGERY     fatty tissue removed   . TRANSTHORACIC ECHOCARDIOGRAM  12/2001; 09/2016   a.Normal LV size and function. EF 55-65%. Normal regional wall motion. No valvular lesions;; b. October 04, 2016: Normal LV size with mild LV hypertrophy. EF 55-60%. Mildly increased RV size with normal systolic function. No significant valvular abnormalities  . VAGINAL HYSTERECTOMY  1983    FAMILY HISTORY: Family History  Problem Relation Age of Onset  . Breast cancer Mother   . Diabetes Mother   . Fibromyalgia Mother   . Lung cancer Father   . Stroke Brother   . Colon cancer Neg Hx   . Colon polyps Neg Hx   . Esophageal cancer Neg Hx   . Rectal cancer Neg Hx   . Stomach cancer Neg Hx     SOCIAL HISTORY: Social History   Socioeconomic History  . Marital status: Single    Spouse name: Not on file  . Number of children: 2  . Years of education: HS  . Highest education level: Not on file  Occupational  History  . Occupation: Multimedia programmer: [employer]  Tobacco Use  . Smoking status: Former Smoker    Quit date: 04/03/1999    Years since quitting: 20.3  . Smokeless tobacco: Never Used  Substance and Sexual Activity  . Alcohol use: Yes    Alcohol/week: 1.0 standard drinks    Types: 1 Glasses of wine per week    Comment: occ Holidays  . Drug use: No  . Sexual activity: Not on file  Other Topics Concern  . Not on file  Social History Narrative   Pt lives at home with her four teenage grandchildren - has legal custody.    Patient is single.   Works for [employer] as a [employer].   Caffeine Use- 2 cups of tea daily.   Right handed.   Social Determinants of Health   Financial Resource Strain:   . Difficulty of Paying  Living Expenses:   Food Insecurity:   . Worried About Charity fundraiser in the Last Year:   . Arboriculturist in the Last Year:   Transportation Needs:   . Film/video editor (Medical):   Marland Kitchen Lack of Transportation (Non-Medical):   Physical Activity:   . Days of Exercise per Week:   . Minutes of Exercise per Session:   Stress:   . Feeling of Stress :   Social Connections:   . Frequency of Communication with Friends and Family:   . Frequency of Social Gatherings with Friends and Family:   . Attends Religious Services:   . Active Member of Clubs or Organizations:   . Attends Archivist Meetings:   Marland Kitchen Marital Status:   Intimate Partner Violence:   . Fear of Current or Ex-Partner:   . Emotionally Abused:   Marland Kitchen Physically Abused:   . Sexually Abused:       PHYSICAL EXAM  Vitals:   07/22/19 1427  BP: 134/78  Pulse: 65  Temp: (!) 96.9 F (36.1 C)  SpO2: 98%  Weight: 137 lb (62.1 kg)  Height: 5\' 2"  (1.575 m)   Body mass index is 25.06 kg/m.  Generalized: Well developed, in no acute distress   Neurological examination  Mentation: Alert oriented to time, place, history taking. Follows all commands speech  and language fluent Cranial nerve II-XII: Pupils were equal round reactive to light. Extraocular movements were full, visual field were full on confrontational test. Facial sensation and strength were normal. Head turning and shoulder shrug  were normal and symmetric. Motor: 5/5 strength of all extremities, but 3-4/5 with right hip flexion Sensory: Sensory testing is intact to soft touch on all 4 extremities. No evidence of extinction is noted.  Coordination: Cerebellar testing reveals good finger-nose-finger and heel-to-shin bilaterally.  Gait and station: Antalgic gait right the right leg, no assistive device Reflexes: Deep tendon reflexes are symmetric and normal bilaterally.   DIAGNOSTIC DATA (LABS, IMAGING, TESTING) - I reviewed patient records, labs, notes, testing and imaging myself where available.  Lab Results  Component Value Date   WBC 6.5 12/11/2006   HGB 12.8 12/11/2006   HCT 37.1 12/11/2006   MCV 89.6 12/11/2006   PLT 387 12/11/2006      Component Value Date/Time   NA 139 12/11/2006 1553   K 3.6 12/11/2006 1553   CL 103 12/11/2006 1553   CO2 28 12/11/2006 1553   GLUCOSE 86 12/11/2006 1553   BUN 13 12/11/2006 1553   CREATININE 0.7 12/11/2006 1553   CALCIUM 9.2 12/11/2006 1553   GFRNONAA 91 12/11/2006 1553   GFRAA 111 12/11/2006 1553   No results found for: CHOL, HDL, LDLCALC, LDLDIRECT, TRIG, CHOLHDL No results found for: HGBA1C No results found for: VITAMINB12 No results found for: TSH    ASSESSMENT AND PLAN 69 y.o. year old female  has a past medical history of Arthritis, Back pain, Cataract, Depression, GERD (gastroesophageal reflux disease), Hyperlipidemia, Hypertension, and Spinal stenosis, lumbar region, without neurogenic claudication. here with:  1.  Lumbar radiculopathy, chronic low back pain, right leg paresthesia, mild weakness -Overall, stable -Will remain on fentanyl patch 12 mcg/hr every 3 days -Continue gabapentin 100 mg 3 times a day -Try  baclofen 10 mg at bedtime for muscle spasms -Completed paper work for handicap sticker  -Follow-up in 1 year or sooner if needed  I spent 20 minutes of face-to-face and non-face-to-face time with patient.  This included previsit chart review,  lab review, study review, order entry, electronic health record documentation, patient education.  Margie Ege, AGNP-C, DNP 07/22/2019, 3:20 PM [facility] [address], [address] [place], [place] [address] [phone]

## 2019-07-22 NOTE — Patient Instructions (Signed)
Try Baclofen 10 mg at bedtime for leg cramps  Continue other medications  See you back in 1 year

## 2019-08-05 ENCOUNTER — Other Ambulatory Visit: Payer: Self-pay | Admitting: Neurology

## 2019-08-05 MED ORDER — FENTANYL 12 MCG/HR TD PT72: 1.0000 | MEDICATED_PATCH | TRANSDERMAL | 0 refills | Status: DC

## 2019-08-05 NOTE — Telephone Encounter (Signed)
Pt is needing as refill on her fentaNYL (DURAGESIC) 12 MCG/HR sent in to the [facility] on [address].

## 2019-08-11 NOTE — Progress Notes (Signed)
I have reviewed and agreed above plan. 

## 2019-09-09 ENCOUNTER — Other Ambulatory Visit: Payer: Self-pay | Admitting: Neurology

## 2019-09-09 MED ORDER — FENTANYL 12 MCG/HR TD PT72: 1.0000 | MEDICATED_PATCH | TRANSDERMAL | 0 refills | Status: DC

## 2019-09-09 NOTE — Telephone Encounter (Signed)
Pt is needing a refill on her fentaNYL (DURAGESIC) 12 MCG/HR sent in to the [facility] on [address]

## 2019-10-07 ENCOUNTER — Other Ambulatory Visit: Payer: Self-pay

## 2019-10-07 MED ORDER — FENTANYL 12 MCG/HR TD PT72: 1.0000 | MEDICATED_PATCH | TRANSDERMAL | 0 refills | Status: DC

## 2019-10-07 NOTE — Telephone Encounter (Signed)
Refill needed for  fentaNYL (DURAGESIC) 12 MCG/HR  [facility] [phone] - [place], [place] - [phone] AT [facility] [address]  [address] [place] [address]  Phone:  [phone] Fax:  [phone]

## 2019-10-07 NOTE — Addendum Note (Signed)
Addended by: Guy Begin on: 10/07/2019 10:52 AM   Modules accepted: Orders

## 2019-10-15 ENCOUNTER — Other Ambulatory Visit: Payer: Self-pay | Admitting: *Deleted

## 2019-10-15 MED ORDER — BACLOFEN 10 MG PO TABS: 10.0000 mg | ORAL_TABLET | Freq: Every evening | ORAL | 1 refills | Status: DC | PRN

## 2019-11-05 ENCOUNTER — Telehealth: Payer: Self-pay | Admitting: Neurology

## 2019-11-05 DIAGNOSIS — M5416 Radiculopathy, lumbar region: Secondary | ICD-10-CM | POA: Diagnosis not present

## 2019-11-05 DIAGNOSIS — M48062 Spinal stenosis, lumbar region with neurogenic claudication: Secondary | ICD-10-CM | POA: Diagnosis not present

## 2019-11-05 NOTE — Telephone Encounter (Signed)
Pt request refill fentaNYL (DURAGESIC) 12 MCG/HR at [id number]  

## 2019-11-05 NOTE — Telephone Encounter (Signed)
Fentanyl 12 Mcg/hr Patch Last filled on 10/10/2019 10 tabs and 09/10/19 10 tabs.  Last OV by Margie Ege, NP, on 07/22/19

## 2019-11-06 MED ORDER — FENTANYL 12 MCG/HR TD PT72: 1.0000 | MEDICATED_PATCH | TRANSDERMAL | 0 refills | Status: DC

## 2019-11-06 NOTE — Telephone Encounter (Signed)
Meds ordered this encounter  Medications  . fentaNYL (DURAGESIC) 12 MCG/HR    Sig: Place 1 patch onto the skin every 3 (three) days. Patient has drug agreement,    Dispense:  10 patch    Refill:  0    [place] narcotic registry checked last fill 09-10-19 #10

## 2019-11-25 DIAGNOSIS — M5416 Radiculopathy, lumbar region: Secondary | ICD-10-CM | POA: Diagnosis not present

## 2019-12-08 ENCOUNTER — Other Ambulatory Visit: Payer: Self-pay | Admitting: Neurology

## 2019-12-08 NOTE — Telephone Encounter (Signed)
Pt called needing a refill on her fentaNYL (DURAGESIC) 12 MCG/HR sent in to the [facility] on [address].

## 2019-12-09 MED ORDER — FENTANYL 12 MCG/HR TD PT72
1.0000 | MEDICATED_PATCH | TRANSDERMAL | 0 refills | Status: DC
Start: 2019-12-09 — End: 2020-01-05

## 2019-12-10 ENCOUNTER — Other Ambulatory Visit: Payer: Self-pay | Admitting: *Deleted

## 2019-12-10 MED ORDER — BACLOFEN 10 MG PO TABS: 10.0000 mg | ORAL_TABLET | Freq: Every evening | ORAL | 1 refills | Status: DC | PRN

## 2020-01-05 ENCOUNTER — Other Ambulatory Visit: Payer: Self-pay | Admitting: Neurology

## 2020-01-05 MED ORDER — FENTANYL 12 MCG/HR TD PT72
1.0000 | MEDICATED_PATCH | TRANSDERMAL | 0 refills | Status: DC
Start: 2020-01-05 — End: 2020-02-04

## 2020-01-05 NOTE — Telephone Encounter (Signed)
Pt request refill fentaNYL (DURAGESIC) 12 MCG/HR at [id number]  

## 2020-01-06 DIAGNOSIS — M5416 Radiculopathy, lumbar region: Secondary | ICD-10-CM | POA: Diagnosis not present

## 2020-01-06 DIAGNOSIS — M48062 Spinal stenosis, lumbar region with neurogenic claudication: Secondary | ICD-10-CM | POA: Diagnosis not present

## 2020-02-04 ENCOUNTER — Other Ambulatory Visit: Payer: Self-pay | Admitting: Neurology

## 2020-02-04 MED ORDER — FENTANYL 12 MCG/HR TD PT72
1.0000 | MEDICATED_PATCH | TRANSDERMAL | 0 refills | Status: DC
Start: 2020-02-04 — End: 2020-03-08

## 2020-02-04 NOTE — Addendum Note (Signed)
Addended by: Guy Begin on: 02/04/2020 04:31 PM   Modules accepted: Orders

## 2020-02-04 NOTE — Telephone Encounter (Signed)
Pt. is requesting a refill for fentaNYL (DURAGESIC) 12 MCG/HR.  Pharmacy: [id number] 

## 2020-02-11 DIAGNOSIS — Z23 Encounter for immunization: Secondary | ICD-10-CM | POA: Diagnosis not present

## 2020-03-07 ENCOUNTER — Other Ambulatory Visit: Payer: Self-pay | Admitting: Neurology

## 2020-03-07 NOTE — Telephone Encounter (Signed)
Pt request refill fentaNYL (DURAGESIC) 12 MCG/HR at [id number]  

## 2020-03-08 MED ORDER — FENTANYL 12 MCG/HR TD PT72
1.0000 | MEDICATED_PATCH | TRANSDERMAL | 0 refills | Status: DC
Start: 2020-03-08 — End: 2020-04-07

## 2020-04-07 ENCOUNTER — Other Ambulatory Visit: Payer: Self-pay | Admitting: Neurology

## 2020-04-07 NOTE — Telephone Encounter (Signed)
Pt is requesting a refill for fentaNYL (DURAGESIC) 12 MCG/HR.  Pharmacy: [id number]   

## 2020-04-07 NOTE — Addendum Note (Signed)
Addended by: Guy Begin on: 04/07/2020 05:33 PM   Modules accepted: Orders

## 2020-04-07 NOTE — Telephone Encounter (Signed)
Next appt 07-2020 (annual appt)

## 2020-04-08 MED ORDER — FENTANYL 12 MCG/HR TD PT72
1.0000 | MEDICATED_PATCH | TRANSDERMAL | 0 refills | Status: DC
Start: 2020-04-08 — End: 2020-05-09

## 2020-05-09 ENCOUNTER — Other Ambulatory Visit: Payer: Self-pay | Admitting: Neurology

## 2020-05-09 MED ORDER — FENTANYL 12 MCG/HR TD PT72
1.0000 | MEDICATED_PATCH | TRANSDERMAL | 0 refills | Status: DC
Start: 2020-05-09 — End: 2020-06-06

## 2020-05-09 NOTE — Addendum Note (Signed)
Addended by: Guy Begin on: 05/09/2020 02:57 PM   Modules accepted: Orders

## 2020-05-09 NOTE — Telephone Encounter (Signed)
Pt has called for a refill on her fentaNYL (DURAGESIC) 12 MCG/HR to [facility] [phone]

## 2020-06-06 ENCOUNTER — Other Ambulatory Visit: Payer: Self-pay | Admitting: Neurology

## 2020-06-06 MED ORDER — FENTANYL 12 MCG/HR TD PT72
1.0000 | MEDICATED_PATCH | TRANSDERMAL | 0 refills | Status: DC
Start: 2020-06-06 — End: 2020-07-07

## 2020-06-06 NOTE — Addendum Note (Signed)
Addended by: Maryland Pink on: 06/06/2020 02:41 PM   Modules accepted: Orders

## 2020-06-06 NOTE — Telephone Encounter (Signed)
Pt. is requesting a refill for fentaNYL (DURAGESIC) 12 MCG/HR.  Pharmacy: [facility] [phone]

## 2020-06-30 ENCOUNTER — Telehealth: Payer: Self-pay | Admitting: Neurology

## 2020-06-30 NOTE — Telephone Encounter (Signed)
Maralyn Sago will be out on 4/21 LVM for pt to call back to r/s

## 2020-07-07 ENCOUNTER — Telehealth: Payer: Self-pay | Admitting: Neurology

## 2020-07-07 MED ORDER — FENTANYL 12 MCG/HR TD PT72
1.0000 | MEDICATED_PATCH | TRANSDERMAL | 0 refills | Status: DC
Start: 2020-07-10 — End: 2020-08-09

## 2020-07-07 NOTE — Telephone Encounter (Signed)
Pt is requesting a refill for fentaNYL (DURAGESIC) 12 MCG/HR.  Pharm[facility]: [facility] [phone]

## 2020-07-07 NOTE — Addendum Note (Signed)
Addended by: Glean Salvo on: 07/07/2020 11:13 AM   Modules accepted: Orders

## 2020-07-07 NOTE — Telephone Encounter (Signed)
[place] drug registry was checked, last written 06/06/20, filled 06/13/20. Was last seen 07/19/19 by Margie Ege, advised to FU in year. Has upcoming appt. With [facility] 07/21/20. Refill is appropriate on my end, please advise.

## 2020-07-18 ENCOUNTER — Telehealth (INDEPENDENT_AMBULATORY_CARE_PROVIDER_SITE_OTHER): Payer: BC Managed Care – PPO | Admitting: Neurology

## 2020-07-18 ENCOUNTER — Encounter: Payer: Self-pay | Admitting: Neurology

## 2020-07-18 DIAGNOSIS — M5440 Lumbago with sciatica, unspecified side: Secondary | ICD-10-CM | POA: Diagnosis not present

## 2020-07-18 DIAGNOSIS — G8929 Other chronic pain: Secondary | ICD-10-CM

## 2020-07-18 DIAGNOSIS — M5441 Lumbago with sciatica, right side: Secondary | ICD-10-CM

## 2020-07-18 MED ORDER — GABAPENTIN 100 MG PO CAPS: 100.0000 mg | ORAL_CAPSULE | Freq: Three times a day (TID) | ORAL | 4 refills | Status: AC

## 2020-07-18 MED ORDER — BACLOFEN 10 MG PO TABS: 10.0000 mg | ORAL_TABLET | Freq: Every evening | ORAL | 3 refills | Status: AC | PRN

## 2020-07-18 NOTE — Progress Notes (Signed)
Virtual Visit via Video Note  I connected with Morgan Swanson on 07/18/20 at  3:45 PM EDT by a video enabled telemedicine application and verified that I am speaking with the correct person using two identifiers.  Location: Patient: at her home Provider: In the office    I discussed the limitations of evaluation and management by telemedicine and the availability of in person appointments. The patient expressed understanding and agreed to proceed.  History of Present Illness: HISTORY OF PRESENT ILLNESS:Morgan Swanson is a 70 years old right-handed African American female, returns to followup for her low back pain, lumbar stenosis,   She had a past medical history of hypertension, hyperlipidemia.    She presented for low back pain since 2008, her low back pain radiating to her right lower extremity, involving right plantar foot, she denies significant weakness, no gait difficulty, she has been treated with fentanyl patch29mcg/q72 hours, which has been very helpful, she only has minimal discomfort. She denies bowel or bladder incontinence.   Most recent MRI lumbar was in 11/2006, which showed grade 1 anterolisthesis L4-5 with moderate to severe facet hypertrophy and moderate to severe central canal stenosis, moderate to severe right foraminal narrowing, right greater than left at L4-5.   Patient has a signed narcotic agreement with this practice.She has been doing her exercises, HEP. She is also taking her gabapentin. She was recently placed trazodone for sleep with good benefit. She is raising her 4 grandchildren. She continues to work full-time. Her work restrictions are no lifting over 20 pounds She returns for reevaluation.   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 She has still numbness at top of right foot, lateral leg, mild unsteady gait, no incontinence,   She was seen by neurosurgeon Dr. Mikal Plane on October 08 2016: Profound stenosis at L4-5 secondary to ligamentum flavum hypertrophy, with the cyst, facet hypertrophy, even worse facet hypertrophy L5-S1, patient is in significant pain, 6 out of 10, with movement Up to 9 Out Of 10,   She continues to use fentanyl patch.  07/21/2018 Dr. Terrace Arabia Virtual Visit viaVideo History of Present Illness: She still has chronic low back pain, getting fentanyl 12mg /hr every 72 Hours, also gabapentin 100mg  tid, she is ok with current medications. She still has significant right leg paresthesia,was receiving epidural injection intermittently   Update July 22, 2019 SS: She has chronic low back pain, receiving fentanyl patch 12 mcg/hr every 3 days, gabapentin 100 mg 3 times daily, meloxicam 7.5 mg twice a day.  Pain is overall well controlled.  Since going back to work in August, wearing compression socks for swelling, is trying to be better about sitting down to rest.  Reports during the night, muscle spasms and cramps in her legs, mostly in the right leg, when turning over.  Needs handicap sticker completed.   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 Saw Dr. Murray Hodgkins in October, got 2 steroid injection in the low back, helped alot. Chronic low back pain, radiates down right  leg. Baclofen 10 mg at bedtime, helped spasms down right leg PRN, is out. Reports saw Dr. Franky Macho 3 years ago, didn't recommend surgery.  Denies any new symptoms, right foot feels numb. No falls, 2 grand kids living with her.   Observations/Objective: Via virtual visit, is alert and oriented, speech is clear and concise, facial symmetry noted, well-appearing, gait is steady and intact  Assessment and Plan: 1.  Lumbar radiculopathy, chronic low back pain, right leg paresthesia, mild weakness -Overall, stable -Will remain on fentanyl patch 12 mcg/hr every 3 days, reviewed drug registry last fill was 07/13/20, no other controlled medications in the last year -Continue gabapentin 100 mg 3 times a day -Continue baclofen 10 mg at bedtime for muscle spasms PRN -Saw Dr. Murray Hodgkins back in October had 2 ESI, good benefit -Follow-up in 7 to 8 months for an office visit or sooner if needed  Follow Up Instructions: 03/21/2021 2:45 PM   I discussed the assessment and treatment plan with the patient. The patient was provided an opportunity to ask questions and all were answered. The patient agreed with the plan and demonstrated an understanding of the instructions.   The patient was advised to call back or seek an in-person evaluation if the symptoms worsen or if the condition fails to improve as anticipated.  I spent 20 minutes of face-to-face and non-face-to-face time with patient.  This included previsit chart review, lab review, study review, order entry, electronic health record documentation, patient education.   Otila Kluver, DNP  [facility] [address] [place], [place] [address] [phone]

## 2020-07-21 ENCOUNTER — Ambulatory Visit: Payer: BC Managed Care – PPO | Admitting: Neurology

## 2020-08-09 ENCOUNTER — Other Ambulatory Visit: Payer: Self-pay | Admitting: Neurology

## 2020-08-09 MED ORDER — FENTANYL 12 MCG/HR TD PT72: 1.0000 | MEDICATED_PATCH | TRANSDERMAL | 0 refills | Status: DC

## 2020-08-09 NOTE — Telephone Encounter (Signed)
Pt called needing a refill on her fentaNYL (DURAGESIC) 12 MCG/HR sent in to [facility] on [address].

## 2020-08-11 MED ORDER — FENTANYL 12 MCG/HR TD PT72: 1.0000 | MEDICATED_PATCH | TRANSDERMAL | 0 refills | Status: DC

## 2020-08-11 NOTE — Addendum Note (Signed)
Addended by: Jacqualine Code D on: 08/11/2020 04:13 PM   Modules accepted: Orders

## 2020-08-11 NOTE — Telephone Encounter (Signed)
I called pharmacy to verify receipt of fentanyl patch, they did not receive the Rx. I pended the order for you to resend, please advise.

## 2020-08-11 NOTE — Telephone Encounter (Signed)
Pt states she has checked with the pharmacy and they have not received the refill request for  fentaNYL (DURAGESIC) 12 MCG/HR sent in to [facility] on [address]

## 2020-09-07 ENCOUNTER — Other Ambulatory Visit: Payer: Self-pay | Admitting: Neurology

## 2020-09-07 MED ORDER — FENTANYL 12 MCG/HR TD PT72: 1.0000 | MEDICATED_PATCH | TRANSDERMAL | 0 refills | Status: DC

## 2020-09-07 NOTE — Addendum Note (Signed)
Addended by: Maryland Pink on: 09/07/2020 02:28 PM   Modules accepted: Orders

## 2020-09-07 NOTE — Telephone Encounter (Signed)
Pt is requesting a refill for fentaNYL (DURAGESIC) 12 MCG/HR.  Pharmacy: [facility] [phone]

## 2020-10-07 ENCOUNTER — Other Ambulatory Visit: Payer: Self-pay | Admitting: Neurology

## 2020-10-07 MED ORDER — FENTANYL 12 MCG/HR TD PT72: 1.0000 | MEDICATED_PATCH | TRANSDERMAL | 0 refills | Status: DC

## 2020-10-07 NOTE — Telephone Encounter (Signed)
Pt request refill fentaNYL (DURAGESIC) 12 MCG/HR at [facility] [phone]

## 2020-10-12 ENCOUNTER — Telehealth: Payer: Self-pay | Admitting: Neurology

## 2020-10-12 NOTE — Telephone Encounter (Addendum)
#  [phone] x [phone] sent on 07/18/2020. Refills should be on file at the pharmacy. I called [facility] at [phone]. Spoke to the pharmacist who confirmed this information. She will get the prescription ready for pick up. I called the patient back and left her a message with this information.

## 2020-10-12 NOTE — Telephone Encounter (Signed)
Pt would like to know why her gabapentin (NEURONTIN) 100 MG capsule was denied.

## 2020-11-07 ENCOUNTER — Other Ambulatory Visit: Payer: Self-pay | Admitting: Neurology

## 2020-11-07 MED ORDER — FENTANYL 12 MCG/HR TD PT72: 1.0000 | MEDICATED_PATCH | TRANSDERMAL | 0 refills | Status: DC

## 2020-11-07 NOTE — Telephone Encounter (Signed)
Pt request refill fentaNYL (DURAGESIC) 12 MCG/HR at [id number]  

## 2020-11-07 NOTE — Telephone Encounter (Signed)
[place] Drug registry Verified LR:10/14/20 Qty: 10 for 30 days  Last OV: 07/18/20 Pending appointment:  03/21/21

## 2020-12-12 ENCOUNTER — Other Ambulatory Visit: Payer: Self-pay | Admitting: Neurology

## 2020-12-12 NOTE — Telephone Encounter (Signed)
Pt is requesting a refill for fentaNYL (DURAGESIC) 12 MCG/HR.  Pharmacy: [id number]   

## 2020-12-12 NOTE — Telephone Encounter (Signed)
[place] Drug registry Verified LR: 11/14/20 Qty: 10 for 30 days  Last OV:07/18/20 Pending appointment:  03/21/21

## 2020-12-13 MED ORDER — FENTANYL 12 MCG/HR TD PT72: 1.0000 | MEDICATED_PATCH | TRANSDERMAL | 0 refills | Status: DC

## 2021-01-10 ENCOUNTER — Telehealth: Payer: Self-pay | Admitting: Neurology

## 2021-01-10 ENCOUNTER — Other Ambulatory Visit: Payer: Self-pay | Admitting: Neurology

## 2021-01-10 DIAGNOSIS — G8929 Other chronic pain: Secondary | ICD-10-CM

## 2021-01-10 MED ORDER — FENTANYL 12 MCG/HR TD PT72: 1.0000 | MEDICATED_PATCH | TRANSDERMAL | 0 refills | Status: DC

## 2021-01-10 NOTE — Telephone Encounter (Signed)
I reviewed her MRI in 2018, severe spinal stenosis L5-S1  Patient does complains of worsening right foot numbness, if she does not use fentanyl patch, she was in so much pain, could not function  I have ordered EMG nerve conduction study, I would think she is a good surgical candidate, rather than rely on pain medicine alone, previously had similar discussion, she wants conservative treatment at that time.

## 2021-01-10 NOTE — Telephone Encounter (Signed)
Patient is up to date on her appointments. Patient is due for a refill on fentanyl. [place] Controlled Substance Registry checked and is appropriate.

## 2021-01-10 NOTE — Telephone Encounter (Signed)
NCV/EMG scheduled for next available date of 03/15/21.

## 2021-01-10 NOTE — Telephone Encounter (Signed)
Pt request refill for fentaNYL (DURAGESIC) 12 MCG/HR at [id number]  

## 2021-01-27 DIAGNOSIS — Z20822 Contact with and (suspected) exposure to covid-19: Secondary | ICD-10-CM | POA: Diagnosis not present

## 2021-02-06 ENCOUNTER — Other Ambulatory Visit: Payer: Self-pay | Admitting: Neurology

## 2021-02-06 MED ORDER — FENTANYL 12 MCG/HR TD PT72: 1.0000 | MEDICATED_PATCH | TRANSDERMAL | 0 refills | Status: DC

## 2021-02-06 NOTE — Telephone Encounter (Signed)
Pt called requesting refill for fentaNYL (DURAGESIC) 12 MCG/HR. Pharmacy [facility] [phone] - [place], [place] - [address] AT [facility] [address].

## 2021-03-06 ENCOUNTER — Other Ambulatory Visit: Payer: Self-pay | Admitting: Neurology

## 2021-03-06 NOTE — Telephone Encounter (Signed)
Pt request refill for fentaNYL (DURAGESIC) 12 MCG/HR at [facility] [phone]

## 2021-03-09 MED ORDER — FENTANYL 12 MCG/HR TD PT72: 1.0000 | MEDICATED_PATCH | TRANSDERMAL | 0 refills | Status: DC

## 2021-03-09 NOTE — Telephone Encounter (Signed)
Verify Drug Registry For fentanyl (Duragesic)12 MCG/HR Last Filled: 02/10/2021 Quantity: 10 for 30 days Last appointment: 07/18/2020 Next appointment: 03/21/2021

## 2021-03-09 NOTE — Telephone Encounter (Signed)
Pt called stating her prescription still has not been sent in to he pharmacy. Pt requesting a call back once the prescription has been sent to pharmacy.

## 2021-03-09 NOTE — Addendum Note (Signed)
Addended by: Christophe Louis E on: 03/09/2021 12:00 PM   Modules accepted: Orders

## 2021-03-15 ENCOUNTER — Other Ambulatory Visit: Payer: Self-pay

## 2021-03-15 ENCOUNTER — Ambulatory Visit (INDEPENDENT_AMBULATORY_CARE_PROVIDER_SITE_OTHER): Payer: BC Managed Care – PPO | Admitting: Neurology

## 2021-03-15 DIAGNOSIS — M5416 Radiculopathy, lumbar region: Secondary | ICD-10-CM

## 2021-03-15 DIAGNOSIS — G8929 Other chronic pain: Secondary | ICD-10-CM | POA: Diagnosis not present

## 2021-03-15 DIAGNOSIS — M5440 Lumbago with sciatica, unspecified side: Secondary | ICD-10-CM

## 2021-03-15 NOTE — Patient Instructions (Signed)
[facility]: Hulda Marin MD  Address: [address], [place], [place] [address]  Phone: [phone]

## 2021-03-15 NOTE — Progress Notes (Signed)
ASSESSMENT AND PLAN  Morgan Swanson is a 70 y.o. female    Lumbar stenosis  MRI of lumbar spine in 2018 showed severe stenosis L5-S1, L4-5,  EMG nerve conduction studies today showed mild chronic changes, no evidence of active process  Was seen by neurosurgeon in 2018, have suggested her conservative treatment  Patient has chronic low back pain, moderate occasionally severe flareup, over the years, her pain has been managed by low-dose stable fentanyl patch 12 mcg/per hour, every 72 hours, along with gabapentin 100 mg 3 times a day, occasionally epidural injection  She wants to continue chronic pain medications, will refer her to pain management  DIAGNOSTIC DATA (LABS, IMAGING, TESTING) - I reviewed patient records, labs, notes, testing and imaging myself where available.   MEDICAL HISTORY:  Morgan Swanson here to follow-up lumbar stenosis, her primary care is nurse practitioner Jonah Blue   She had a past medical history of hypertension, hyperlipidemia.       She presented for low back pain since 2008, her low back pain radiating to her right lower extremity, involving right plantar foot, she denies significant weakness, no gait difficulty, she has been treated with fentanyl patch62mcg/q72 hours, which has been very helpful, she only has minimal discomfort. She denies bowel or bladder incontinence.    MRI lumbar was in 11/2006, which showed grade 1 anterolisthesis L4-5 with moderate to severe facet hypertrophy and moderate to severe central canal stenosis, moderate to severe right foraminal narrowing, right greater than left at L4-5.   She has  been doing her exercises, HEP. She is also  taking her gabapentin. She was recently placed trazodone for sleep with good benefit. She is raising her 4 grandchildren. She continues to work full-time. Her work restrictions are no lifting over 20 pounds She returns for reevaluation.    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 She has still numbness at top of right foot, lateral leg, mild unsteady gait, no incontinence,    She was seen by neurosurgeon Dr. Mikal Plane on October 08 2016: Profound stenosis at L4-5 secondary to ligamentum flavum hypertrophy, with the cyst, facet hypertrophy, even worse facet hypertrophy L5-S1,   patient is in significant pain, 6 out of 10, with movement Up to 9 Out Of 10,     She continues to use fentanyl patch.  Has been helpful,  Update March 15, 2021: She is using gabapentin 100 mg 3 times a day, has been on fentanyl patch low-dose 12 mcg every hour every 3 days for many years, recent flareup of worsening low back pain, radiating pain to right lower extremity, received epidural injection in March 2022 has been helpful  She wants to stay on fentanyl patch, explained to her, our office is moving away from chronic narcotic prescription, she is waiting to continue by pain  management,  Personally reviewed MRI of lumbar spine 2018, severe spinal stenosis L4-5, worse in the transverse diameter, with 8 to 9 mm anterolisthesis, disc herniation and severe facet hypertrophy, severe bilateral foraminal stenosis, severe stenosis L5-S1, facet hypertrophy,  She return for EMG nerve conduction study today, which only showed slight chronic neuropathic  changes, no evidence of active process,  PHYSICAL EXAM:   There were no vitals filed for this visit. Not recorded     There is no height or weight on file to calculate BMI.  PHYSICAL EXAMNIATION:  Gen: NAD, conversant, well nourised, well groomed                     Cardiovascular: Regular rate rhythm, no peripheral edema, warm, nontender. Eyes: Conjunctivae clear without exudates or hemorrhage Neck: Supple, no carotid bruits. Pulmonary: Clear to auscultation bilaterally   NEUROLOGICAL EXAM:  MENTAL STATUS: Speech/cognition: Awake, alert, oriented to history taking and casual conversation   CRANIAL NERVES: CN II: Visual fields are full to confrontation. Pupils are round equal and briskly reactive to light. CN III, IV, VI: extraocular movement are normal. No ptosis. CN V: Facial sensation is intact to light touch CN VII: Face is symmetric with normal eye closure  CN VIII: Hearing is normal to causal conversation. CN IX, X: Phonation is normal. CN XI: Head turning and shoulder shrug are intact  MOTOR: Slight right toe extension weakness REFLEXES: Reflexes are 2+ and symmetric at the biceps, triceps, right knee was absent, left knee was brisk knees, and absent at bilateral ankles. Plantar responses are flexor.  SENSORY: Intact to light touch, pinprick and vibratory sensation are intact in fingers and toes.  COORDINATION: There is no trunk or limb dysmetria noted.  GAIT/STANCE: She needs push-up to get up from seated position, antalgic, steady  REVIEW OF SYSTEMS:  Full 14 system review of systems performed and notable only for as above All other review of systems were negative.   ALLERGIES: Allergies  Allergen Reactions   Codeine    Penicillins     HOME MEDICATIONS: Current Outpatient Medications  Medication Sig Dispense Refill   amLODipine (NORVASC) 10 MG tablet Take 1 tablet by mouth daily.     baclofen (LIORESAL) 10 MG tablet Take 1 tablet (10 mg total) by  mouth at bedtime as needed for muscle spasms. 30 each 3   Cholecalciferol (VITAMIN D3) 50000 units CAPS Take 50,000 Units by mouth once a week.     CRESTOR 20 MG tablet Take 1 tablet by mouth daily.     fentaNYL (DURAGESIC) 12 MCG/HR Place 1 patch onto the skin every 3 (three) days. Patient has drug agreement, 10 patch 0   gabapentin (NEURONTIN) 100 MG capsule Take 1 capsule (100 mg total) by mouth 3 (three) times daily. 270 capsule 4   lisinopril (PRINIVIL,ZESTRIL) 10 MG tablet Take 10 mg by mouth daily.  0   meloxicam (MOBIC) 15 MG tablet Take 0.5 tablets (7.5 mg total) by mouth 2 (two) times daily. 30 tablet 5   omeprazole (PRILOSEC) 40 MG capsule Take 1 capsule by mouth daily.     triamterene-hydrochlorothiazide (MAXZIDE-25) 37.5-25 MG tablet Take 0.5 tablets by mouth daily.  0   No current facility-administered medications for this visit.    PAST MEDICAL HISTORY: Past Medical History:  Diagnosis Date   Arthritis    back    Back pain    Cataract    small immature   Depression    GERD (gastroesophageal  reflux disease)    Hyperlipidemia    Hypertension    Spinal stenosis, lumbar region, without neurogenic claudication     PAST SURGICAL HISTORY: Past Surgical History:  Procedure Laterality Date   CHOLECYSTECTOMY     HAND SURGERY     fatty tissue removed    TRANSTHORACIC ECHOCARDIOGRAM  12/2001; 09/2016   a.Normal LV size and function. EF 55-65%. Normal regional wall motion. No valvular lesions;; b. October 04, 2016: Normal LV size with mild LV hypertrophy. EF 55-60%. Mildly increased RV size with normal systolic function. No significant valvular abnormalities   VAGINAL HYSTERECTOMY  1983    FAMILY HISTORY: Family History  Problem Relation Age of Onset   Breast cancer Mother    Diabetes Mother    Fibromyalgia Mother    Lung cancer Father    Stroke Brother    Colon cancer Neg Hx    Colon polyps Neg Hx    Esophageal cancer Neg Hx    Rectal cancer Neg Hx    Stomach cancer  Neg Hx     SOCIAL HISTORY: Social History   Socioeconomic History   Marital status: Single    Spouse name: Not on file   Number of children: 2   Years of education: HS   Highest education level: Not on file  Occupational History   Occupation: Multimedia programmer: [employer]  Tobacco Use   Smoking status: Former    Types: Cigarettes    Quit date: 04/03/1999    Years since quitting: 21.9   Smokeless tobacco: Never  Substance and Sexual Activity   Alcohol use: Yes    Alcohol/week: 1.0 standard drink    Types: 1 Glasses of wine per week    Comment: occ Holidays   Drug use: No   Sexual activity: Not on file  Other Topics Concern   Not on file  Social History Narrative   Pt lives at home with her four teenage grandchildren - has legal custody.    Patient is single.   Works for [employer] as a [employer].   Caffeine Use- 2 cups of tea daily.   Right handed.   Social Determinants of Health   Financial Resource Strain: Not on file  Food Insecurity: Not on file  Transportation Needs: Not on file  Physical Activity: Not on file  Stress: Not on file  Social Connections: Not on file  Intimate Partner Violence: Not on file      Levert Feinstein, M.D. Ph.D.  [facility] [address], [address] [place], [place] [address] Ph: [phone] Fax: [phone]  CC:  Theora Gianotti, NP [address] [place],  [place] [address]  Theora Gianotti, NP

## 2021-03-15 NOTE — Procedures (Signed)
Full Name: Morgan Swanson Gender: Female MRN #: [id number] Date of Birth: Dec 10, 1950    Visit Date: 03/15/2021 12:21 Age: 70 Years Examining Physician: Levert Feinstein, MD  Referring Physician: Levert Feinstein, MD History: 70 year old female, presented with chronic low back pain, known history of lumbar stenosis, bilateral feet paresthesia  Summary of the test:  Nerve conduction study:  Bilateral sural, superficial peroneal sensory responses were normal.  Bilateral tibial, left peroneal to EDB motor responses were normal.  Right peroneal to EDB motor response showed moderately decreased CMAP amplitude.  Electromyography, Selected needle examination of bilateral lower extremity muscles and bilateral lumbosacral paraspinal muscles was performed.  There is evidence of mild chronic neuropathic changes involving bilateral L4-5 myotomes, no evidence of active process.  Conclusion: This is an abnormal study.  There is electrodiagnostic evidence of mild chronic bilateral lumbosacral radiculopathy.  There is no evidence of active process.    ------------------------------- Levert Feinstein M.D. Ph.D.  [facility] [address], [address] [place], [place] [address] Tel: [phone] Fax: [phone]  Verbal informed consent was obtained from the patient, patient was informed of potential risk of procedure, including bruising, bleeding, hematoma formation, infection, muscle weakness, muscle pain, numbness, among others.        MNC    Nerve / Sites Muscle Latency Ref. Amplitude Ref. Rel Amp Segments Distance Velocity Ref. Area    ms ms mV mV %  cm m/s m/s mVms  R Peroneal - EDB     Ankle EDB 3.5 ?6.5 1.0 ?2.0 100 Ankle - EDB 9   2.8     Fib head EDB 9.3  0.8  83 Fib head - Ankle 25 44 ?44 2.4     Pop fossa EDB 11.6  0.7  87.4 Pop fossa - Fib head 10 44 ?44 2.4         Pop fossa - Ankle      L Peroneal - EDB     Ankle EDB 3.0 ?6.5 4.4 ?2.0 100 Ankle - EDB 9   13.1     Fib  head EDB 8.9  3.7  85 Fib head - Ankle 26 44 ?44 12.8     Pop fossa EDB 11.2  3.8  101 Pop fossa - Fib head 10 44 ?44 12.5         Pop fossa - Ankle      R Tibial - AH     Ankle AH 3.4 ?5.8 14.4 ?4.0 100 Ankle - AH 9   23.6     Pop fossa AH 11.6  10.6  73.7 Pop fossa - Ankle 38 46 ?41 20.0  L Tibial - AH     Ankle AH 3.0 ?5.8 12.8 ?4.0 100 Ankle - AH 9   21.4     Pop fossa AH 10.6  10.6  82.9 Pop fossa - Ankle 37 49 ?41 19.0             SNC    Nerve / Sites Rec. Site Peak Lat Ref.  Amp Ref. Segments Distance    ms ms V V  cm  R Sural - Ankle (Calf)     Calf Ankle 3.0 ?4.4 7 ?6 Calf - Ankle 14  L Sural - Ankle (Calf)     Calf Ankle 2.9 ?4.4 8 ?6 Calf - Ankle 14  R Superficial peroneal - Ankle     Lat leg Ankle 2.8 ?4.4 12 ?6 Lat leg - Ankle 14  L Superficial peroneal - Ankle     Lat leg Ankle 3.2 ?4.4 10 ?6 Lat leg - Ankle 14             F  Wave    Nerve F Lat Ref.   ms ms  R Tibial - AH 50.8 ?56.0  L Tibial - AH 49.0 ?56.0         EMG Summary Table    Spontaneous MUAP Recruitment  Muscle IA Fib PSW Fasc Other Amp Dur. Poly Pattern  R. Tibialis anterior Normal None None None _______ Normal Normal Normal Reduced  R. Tibialis posterior Normal None None None _______ Normal Normal Normal Reduced  R. Peroneus longus Normal None None None _______ Normal Normal Normal Reduced  R. Gastrocnemius (Medial head) Normal None None None _______ Normal Normal Normal Normal  R. Vastus lateralis Normal None None None _______ Normal Normal Normal Reduced  L. Tibialis anterior Normal None None None _______ Normal Normal Normal Reduced  L. Tibialis posterior Normal None None None _______ Normal Normal Normal Reduced  L. Peroneus longus Normal None None None _______ Normal Normal Normal Reduced  L. Gastrocnemius (Medial head) Normal None None None _______ Normal Normal Normal Reduced  L. Vastus lateralis Normal None None None _______ Normal Normal Normal Reduced  R. Lumbar paraspinals (low)  Normal None None None _______ Normal Normal Normal Normal  R. Lumbar paraspinals (mid) Normal None None None _______ Normal Normal Normal Normal  L. Lumbar paraspinals (low) Normal None None None _______ Normal Normal Normal Normal  L. Lumbar paraspinals (mid) Normal None None None _______ Normal Normal Normal Normal

## 2021-03-20 ENCOUNTER — Telehealth: Payer: Self-pay | Admitting: Neurology

## 2021-03-20 NOTE — Telephone Encounter (Signed)
Referral has been sent to [facility]. Phone: [phone].

## 2021-03-21 ENCOUNTER — Ambulatory Visit: Payer: BC Managed Care – PPO | Admitting: Neurology

## 2021-03-21 NOTE — Progress Notes (Deleted)
PATIENT: Morgan Swanson DOB: [birth year]  REASON FOR VISIT: Follow up HISTORY FROM: Patient PRIMARY NEUROLOGIST: Dr. Terrace Arabia   HISTORY  Tommie Sams C Schatzman here to follow-up lumbar stenosis, her primary care is nurse practitioner Jonah Blue    She had a past medical history of hypertension, hyperlipidemia.       She presented for low back pain since 2008, her low back pain radiating to her right lower extremity, involving right plantar foot, she denies significant weakness, no gait difficulty, she has been treated with fentanyl patch31mcg/q72 hours, which has been very helpful, she only has minimal discomfort. She denies bowel or bladder incontinence.    MRI lumbar was in 11/2006, which showed grade 1 anterolisthesis L4-5 with moderate to severe facet hypertrophy and moderate to severe central canal stenosis, moderate to severe right foraminal narrowing, right greater than left at L4-5.    She has  been doing her exercises, HEP. She is also  taking her gabapentin. She was recently placed trazodone for sleep with good benefit. She is raising her 4 grandchildren. She continues to work full-time. Her work restrictions are no lifting over 20 pounds She returns for reevaluation.    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 She has  still numbness at top of right foot, lateral leg, mild unsteady gait, no incontinence,    She was seen by neurosurgeon Dr. Mikal Plane on October 08 2016: Profound stenosis at L4-5 secondary to ligamentum flavum hypertrophy, with the cyst, facet hypertrophy, even worse facet hypertrophy L5-S1,   patient is in significant pain, 6 out of 10, with movement Up to 9 Out Of 10,     She continues to use fentanyl patch.  Has been helpful,   Update March 15, 2021: She is using gabapentin 100 mg 3 times a day, has been on fentanyl patch low-dose 12 mcg every hour every 3 days for many years, recent flareup of worsening low back pain, radiating pain to right lower extremity, received epidural injection in March 2022 has been helpful  She wants to stay on fentanyl patch, explained to her, our office is moving away from chronic narcotic prescription, she is waiting to continue by pain management,   Personally reviewed MRI of lumbar spine 2018, severe spinal stenosis L4-5, worse in the transverse diameter, with 8 to 9 mm anterolisthesis, disc herniation and severe facet hypertrophy, severe bilateral foraminal stenosis, severe stenosis L5-S1, facet hypertrophy,  She return for EMG nerve conduction study today, which only showed slight chronic neuropathic changes, no evidence of active process,  Update March 21, 2021 SS:   REVIEW OF SYSTEMS: Out of a complete 14 system review of symptoms, the patient complains only of the following symptoms, and all other reviewed systems are negative.  ALLERGIES: Allergies  Allergen Reactions   Codeine    Penicillins  HOME MEDICATIONS: Outpatient Medications Prior to Visit  Medication Sig Dispense Refill   amLODipine (NORVASC) 10 MG tablet Take 1 tablet by mouth daily.     baclofen (LIORESAL) 10 MG tablet Take 1 tablet (10 mg total) by mouth at bedtime as needed for muscle spasms. 30 each 3   Cholecalciferol (VITAMIN D3) 50000 units CAPS Take 50,000 Units by mouth  once a week.     CRESTOR 20 MG tablet Take 1 tablet by mouth daily.     fentaNYL (DURAGESIC) 12 MCG/HR Place 1 patch onto the skin every 3 (three) days. Patient has drug agreement, 10 patch 0   gabapentin (NEURONTIN) 100 MG capsule Take 1 capsule (100 mg total) by mouth 3 (three) times daily. 270 capsule 4   lisinopril (PRINIVIL,ZESTRIL) 10 MG tablet Take 10 mg by mouth daily.  0   meloxicam (MOBIC) 15 MG tablet Take 0.5 tablets (7.5 mg total) by mouth 2 (two) times daily. 30 tablet 5   omeprazole (PRILOSEC) 40 MG capsule Take 1 capsule by mouth daily.     triamterene-hydrochlorothiazide (MAXZIDE-25) 37.5-25 MG tablet Take 0.5 tablets by mouth daily.  0   No facility-administered medications prior to visit.    PAST MEDICAL HISTORY: Past Medical History:  Diagnosis Date   Arthritis    back    Back pain    Cataract    small immature   Depression    GERD (gastroesophageal reflux disease)    Hyperlipidemia    Hypertension    Spinal stenosis, lumbar region, without neurogenic claudication     PAST SURGICAL HISTORY: Past Surgical History:  Procedure Laterality Date   CHOLECYSTECTOMY     HAND SURGERY     fatty tissue removed    TRANSTHORACIC ECHOCARDIOGRAM  12/2001; 09/2016   a.Normal LV size and function. EF 55-65%. Normal regional wall motion. No valvular lesions;; b. October 04, 2016: Normal LV size with mild LV hypertrophy. EF 55-60%. Mildly increased RV size with normal systolic function. No significant valvular abnormalities   VAGINAL HYSTERECTOMY  1983    FAMILY HISTORY: Family History  Problem Relation Age of Onset   Breast cancer Mother    Diabetes Mother    Fibromyalgia Mother    Lung cancer Father    Stroke Brother    Colon cancer Neg Hx    Colon polyps Neg Hx    Esophageal cancer Neg Hx    Rectal cancer Neg Hx    Stomach cancer Neg Hx     SOCIAL HISTORY: Social History   Socioeconomic History   Marital status: Single    Spouse name: Not on file   Number of  children: 2   Years of education: HS   Highest education level: Not on file  Occupational History   Occupation: Multimedia programmer: [employer]  Tobacco Use   Smoking status: Former    Types: Cigarettes    Quit date: 04/03/1999    Years since quitting: 21.9   Smokeless tobacco: Never  Substance and Sexual Activity   Alcohol use: Yes    Alcohol/week: 1.0 standard drink    Types: 1 Glasses of wine per week    Comment: occ Holidays   Drug use: No   Sexual activity: Not on file  Other Topics Concern   Not on file  Social History Narrative   Pt lives at home with her four teenage grandchildren - has legal custody.    Patient is single.   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   Caffeine Use- 2 cups of tea daily.   Right handed.   Social Determinants of Health   Financial Resource Strain: Not on file  Food Insecurity: Not on file  Transportation Needs: Not on file  Physical Activity: Not on file  Stress: Not on file  Social Connections: Not on file  Intimate Partner Violence: Not on file      PHYSICAL EXAM  There were no vitals filed for this visit. There is no height or weight on file to calculate BMI.  Generalized: Well developed, in no acute distress   Neurological examination  Mentation: Alert oriented to time, place, history taking. Follows all commands speech and language fluent Cranial nerve II-XII: Pupils were equal round reactive to light. Extraocular movements were full, visual field were full on confrontational test. Facial sensation and strength were normal. Uvula tongue midline. Head turning and shoulder shrug  were normal and symmetric. Motor: The motor testing reveals 5 over 5 strength of all 4 extremities. Good symmetric motor tone is noted throughout.  Sensory: Sensory testing is intact to soft touch on all 4 extremities. No evidence of extinction is noted.  Coordination: Cerebellar testing reveals good finger-nose-finger and  heel-to-shin bilaterally.  Gait and station: Gait is normal. Tandem gait is normal. Romberg is negative. No drift is seen.  Reflexes: Deep tendon reflexes are symmetric and normal bilaterally.   DIAGNOSTIC DATA (LABS, IMAGING, TESTING) - I reviewed patient records, labs, notes, testing and imaging myself where available.  Lab Results  Component Value Date   WBC 6.5 12/11/2006   HGB 12.8 12/11/2006   HCT 37.1 12/11/2006   MCV 89.6 12/11/2006   PLT 387 12/11/2006      Component Value Date/Time   NA 139 12/11/2006 1553   K 3.6 12/11/2006 1553   CL 103 12/11/2006 1553   CO2 28 12/11/2006 1553   GLUCOSE 86 12/11/2006 1553   BUN 13 12/11/2006 1553   CREATININE 0.7 12/11/2006 1553   CALCIUM 9.2 12/11/2006 1553   GFRNONAA 91 12/11/2006 1553   GFRAA 111 12/11/2006 1553   No results found for: CHOL, HDL, LDLCALC, LDLDIRECT, TRIG, CHOLHDL No results found for: IOMB5D No results found for: VITAMINB12 No results found for: TSH    ASSESSMENT AND PLAN 70 y.o. year old female  has a past medical history of Arthritis, Back pain, Cataract, Depression, GERD (gastroesophageal reflux disease), Hyperlipidemia, Hypertension, and Spinal stenosis, lumbar region, without neurogenic claudication. here with:  1.  Lumbar stenosis -MRI lumbar spine in 2018 showed severe stenosis at L5-S1, L4-5 -EMG/NCV 03/15/21 showed mild chronic changes, no evidence of active process -Saw neurosurgeon in 2018 suggested conservative treatment   I spent 15 minutes with the patient. 50% of this time was spent   Margie Ege, [place], [place] 03/21/2021, 5:38 AM [facility] [address], [address] [place], [place] [address] [phone]

## 2021-04-06 ENCOUNTER — Other Ambulatory Visit: Payer: Self-pay | Admitting: Neurology

## 2021-04-06 MED ORDER — FENTANYL 12 MCG/HR TD PT72: 1.0000 | MEDICATED_PATCH | TRANSDERMAL | 0 refills | Status: DC

## 2021-04-06 NOTE — Telephone Encounter (Signed)
Verify Drug Registry For Fentanyl 12 Mcg/hr Patch Last Filled: 03/13/2021 Quantity: 10.00 for 30 days Last appointment: 07/18/2020 Next appointment: 09/19/2021

## 2021-04-06 NOTE — Telephone Encounter (Signed)
Pt is needing a refill request on her fentaNYL (DURAGESIC) 12 MCG/HR sent to [facility] on [address].

## 2021-05-15 ENCOUNTER — Other Ambulatory Visit: Payer: Self-pay | Admitting: Neurology

## 2021-05-15 MED ORDER — FENTANYL 12 MCG/HR TD PT72: 1.0000 | MEDICATED_PATCH | TRANSDERMAL | 0 refills | Status: AC

## 2021-05-15 NOTE — Telephone Encounter (Signed)
Verify Drug Registry For fentanyl (Duragesic) 12 MCG/HR Last Filled: 04/17/2021 Quantity: 10 for 30 days Last appointment: 07/18/2020 Next appointment: 09/19/2021

## 2021-05-15 NOTE — Telephone Encounter (Signed)
Pt is requesting a refill for fentaNYL (DURAGESIC) 12 MCG/HR.  Pharmacy: [id number]   

## 2021-05-22 DIAGNOSIS — G894 Chronic pain syndrome: Secondary | ICD-10-CM | POA: Diagnosis not present

## 2021-05-22 DIAGNOSIS — Z79891 Long term (current) use of opiate analgesic: Secondary | ICD-10-CM | POA: Diagnosis not present

## 2021-05-22 DIAGNOSIS — M48061 Spinal stenosis, lumbar region without neurogenic claudication: Secondary | ICD-10-CM | POA: Diagnosis not present

## 2021-05-22 DIAGNOSIS — M47817 Spondylosis without myelopathy or radiculopathy, lumbosacral region: Secondary | ICD-10-CM | POA: Diagnosis not present

## 2021-05-22 DIAGNOSIS — M5416 Radiculopathy, lumbar region: Secondary | ICD-10-CM | POA: Diagnosis not present

## 2021-06-15 ENCOUNTER — Other Ambulatory Visit: Payer: Self-pay | Admitting: Neurology

## 2021-06-15 NOTE — Telephone Encounter (Signed)
Pt is requesting a refill for fentaNYL (DURAGESIC) 12 MCG/HR. ? ?Pharmacy:  [facility] [phone]  ? ?

## 2021-06-15 NOTE — Telephone Encounter (Signed)
Verify Drug Registry For fentaNYL (DURAGESIC) 12 MCG/HR ?Last Filled: 05/17/2021 ?Quantity: 10 for 30 days ?Last appointment: 03/15/2021 ?Next appointment: 09/19/2021 ?

## 2021-06-19 ENCOUNTER — Other Ambulatory Visit: Payer: Self-pay | Admitting: Neurology

## 2021-06-19 ENCOUNTER — Telehealth: Payer: Self-pay | Admitting: Neurology

## 2021-06-19 NOTE — Addendum Note (Signed)
Addended by: Geronimo Running A on: 06/19/2021 12:06 PM ? ? Modules accepted: Orders ? ?

## 2021-06-19 NOTE — Telephone Encounter (Signed)
Patient is due for a refill on fentanyl. Patient is up to date on her appointments. [place] Controlled Substance Registry checked and is appropriate. ? ? ?

## 2021-06-19 NOTE — Telephone Encounter (Signed)
I have seen patient in December 2022, had long discussion with patient, referred her to pain management with ? ?Will not continue refill for fentanyl through our office. ?

## 2021-06-19 NOTE — Telephone Encounter (Signed)
Pt called in asking for a refill of her fentaNYL (DURAGESIC) 12 MCG/HR to be sent to [facility] on [address]. ?

## 2021-06-19 NOTE — Telephone Encounter (Signed)
I called patient. I discussed this with her. She reports that she has tried calling [facility] but they have not returned her call. She will call them again. ?

## 2021-07-10 DIAGNOSIS — M5416 Radiculopathy, lumbar region: Secondary | ICD-10-CM | POA: Diagnosis not present

## 2021-07-10 DIAGNOSIS — G894 Chronic pain syndrome: Secondary | ICD-10-CM | POA: Diagnosis not present

## 2021-07-10 DIAGNOSIS — M47817 Spondylosis without myelopathy or radiculopathy, lumbosacral region: Secondary | ICD-10-CM | POA: Diagnosis not present

## 2021-07-10 DIAGNOSIS — M48061 Spinal stenosis, lumbar region without neurogenic claudication: Secondary | ICD-10-CM | POA: Diagnosis not present

## 2021-08-07 ENCOUNTER — Encounter: Payer: Self-pay | Admitting: Cardiology

## 2021-08-07 ENCOUNTER — Ambulatory Visit (INDEPENDENT_AMBULATORY_CARE_PROVIDER_SITE_OTHER): Payer: BC Managed Care – PPO

## 2021-08-07 ENCOUNTER — Ambulatory Visit (INDEPENDENT_AMBULATORY_CARE_PROVIDER_SITE_OTHER): Payer: BC Managed Care – PPO | Admitting: Cardiology

## 2021-08-07 VITALS — BP 150/90 | HR 56 | Ht 63.0 in | Wt 146.0 lb

## 2021-08-07 DIAGNOSIS — R002 Palpitations: Secondary | ICD-10-CM

## 2021-08-07 DIAGNOSIS — I10 Essential (primary) hypertension: Secondary | ICD-10-CM | POA: Diagnosis not present

## 2021-08-07 DIAGNOSIS — R011 Cardiac murmur, unspecified: Secondary | ICD-10-CM

## 2021-08-07 NOTE — Progress Notes (Signed)
? ?Primary Care Provider: Dot BeenYaya, Irene J, FNP ?  ([facility]) ?Cardiologist: None ?Electrophysiologist: None ? ?Clinic Note: ?Chief Complaint  ?Patient presents with  ? Palpitations  ? New Patient (Initial Visit)  ?  Referred for reassessment of palpitations.  ? ?=================================== ? ?ASSESSMENT/PLAN  ? ?Problem List Items Addressed This Visit   ? ?  ? Cardiology Problems  ? Essential hypertension (Chronic)  ?  Blood pressure is high today, I think she is very anxious.  We can reassess.  She does have room if necessary to use a beta-blocker since she is already on amlodipine, lisinopril and Maxide. ? ?Essentially resistant hypertension. ? ?  ?  ?  ? Other  ? Heart palpitations - Primary  ?  In the past, she did not have any significant arrhythmias. ? ?We will reassess with a 7-day Zio patch, if that does not show STEMI, we can consider beta-blocker attempted to treat her symptoms.  Would like to mmHg however. ? ?Structurally normal echocardiogram.  No need to be rechecked. ? ?  ?  ? Relevant Orders  ? EKG 12-Lead  ? LONG TERM MONITOR (3-14 DAYS)  ? Systolic murmur (Chronic)  ?  Artery sclerosis.  Minimal murmur on exam.  Probably not worth evaluating with echo. ? ?  ?  ? ? ?=================================== ? ?HPI:   ? ?Morgan Swanson is a 71 y.o. female with a PMH notable for HTN, HLD, prediabetes with chronic pain/lumbar radiculopathy who is being seen today for the reevaluation of heart palpitations at the request of Dot BeenYaya, Irene J, FNP.  ([facility]) ? ?I last saw her in August 2018 for follow-up evaluation of heart fluttering => was feeling well.  Fluttering sensations are notably improved.  Usually during social stress situations.  Moderate not showing any significant at that time.  Work-up was relatively benign-echo and monitor reviewed below. ? ?Morgan Swanson was just seen by Sherron FlemingsIrene Yaya, FNP on July 17, 2021 for routine 5 follow-up  with complaints of heart palpitations intermittent.  Daily for the last month or so.  Indicated that she had been evaluated several years ago with a relatively benign work-up.  Referred back to reestablish care, since she had labs from being an active patient. ? ?Recent Hospitalizations: None ? ?Reviewed  CV studies:   ? ?The following studies were reviewed today: (if available, images/films reviewed: From Epic Chart or Care Everywhere) ?Reviewed below: ? ? ?Interval History:  ? ?Morgan Swanson presents here today for evaluation of palpitations.  She describes having more frequent episodes of palpitations that occur in clusters.  She is having them more frequently more days of the week and more times during the day.  They happen every now and then though there is no real rhyme or reason although they do seem to happen when she is at work or active, oftentimes when she is try to settle down at night, but not with sleep.  She says now they happen maybe 4 or 5 times a day.  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 ). ? ?She does acknowledge that there has been a lot of people with work.  The school that she had been at [employer] the different school and so they are now groups in both different staff and the interpersonal conflicts have  been more telling.  Lots of stress with disjointed staff and she has found that somewhat disconcerting having to deal with this at age 59.  She is thought about retiring although she does not know what she would do if she did. ? ?She does not drink any coffee and only drinks decaf tea.  She likes to drink ginger ale, but does not really drink a lot of water. ? ?She is otherwise asymptomatic from cardiac standpoint. ? ? ?CV Review of Symptoms (Summary) ?Cardiovascular ROS: positive for - irregular heartbeat, palpitations, and just an unusual sensation in  her chest when these occur.  The symptoms make her feel tired ?negative for - chest pain, dyspnea on exertion, edema, orthopnea, paroxysmal nocturnal dyspnea, rapid heart rate, shortness of breath, or lightheadedness, dizziness or wooziness, syncope/near syncope or TIA/amaurosis fugax claudication ? ?REVIEWED OF SYSTEMS  ? ?Review of Systems  ?Constitutional:  Positive for malaise/fatigue (When she has a lots of palpitations, she feels a lot of fatigue.). Negative for weight loss.  ?HENT:  Negative for congestion and nosebleeds.   ?Respiratory:  Negative for cough and shortness of breath.   ?Cardiovascular:   ?     Per HPI  ?Gastrointestinal:  Negative for blood in stool and melena.  ?Genitourinary:  Negative for hematuria.  ?Musculoskeletal:  Positive for joint pain. Negative for falls and myalgias.  ?Neurological:  Negative for dizziness and focal weakness.  ?Psychiatric/Behavioral:  Negative for depression and memory loss. The patient is not nervous/anxious (She can get a little anxious with the changes at work, but usually controlled.) and does not have insomnia.   ?     Social stress as noted above.  Otherwise stable  ? ?I have reviewed and (if needed) personally updated the patient's problem list, medications, allergies, past medical and surgical history, social and family history.  ? ?PAST MEDICAL HISTORY  ? ?Past Medical History:  ?Diagnosis Date  ? Arthritis   ? back   ? Back pain   ? Cataract   ? small immature  ? Depression   ? GERD (gastroesophageal reflux disease)   ? Hyperlipidemia   ? Hypertension   ? Spinal stenosis, lumbar region, without neurogenic claudication   ? ? ?PAST SURGICAL HISTORY  ? ?Past Surgical History:  ?Procedure Laterality Date  ? CHOLECYSTECTOMY    ? HAND SURGERY    ? fatty tissue removed   ? TRANSTHORACIC ECHOCARDIOGRAM  12/2001; 09/2016  ? a.Normal LV size and function. EF 55-65%. Normal regional wall motion. No valvular lesions;; b. October 04, 2016: Normal LV size with mild LV  hypertrophy. EF 55-60%. Mildly increased RV size with normal systolic function. No significant valvular abnormalities  ? VAGINAL HYSTERECTOMY  1983  ? ?Transthoracic Echo: October 04, 2016: Normal LV size with mild LV hypertrophy. EF 55-60%. Mildly increased RV size with normal systolic function. No significant valvular abnormalities ?30 day and monitor: 15 events noted. Mostly sinus rhythm with occasional bradycardia. Low rate 50. Average rate 70 bpm. Rare PACs but no runs or couplets. No arrhythmia noted. ? ?Immunization History  ?Administered Date(s) Administered  ? Moderna Sars-Covid-2 Vaccination 04/26/2019, 05/24/2019  ? ? ?MEDICATIONS/ALLERGIES  ? ?Current Meds  ?Medication Sig  ? amLODipine (NORVASC) 10 MG tablet Take 1 tablet by mouth daily.  ? baclofen (LIORESAL) 10 MG tablet Take 1 tablet (10 mg total) by mouth at bedtime as needed for muscle spasms.  ? busPIRone (BUSPAR) 7.5 MG tablet Take 7.5 mg by mouth 2 (two) times daily.  ?  Cholecalciferol (VITAMIN D3) 50000 units CAPS Take 50,000 Units by mouth once a week.  ? CRESTOR 20 MG tablet Take 1 tablet by mouth daily.  ? fentaNYL (DURAGESIC) 12 MCG/HR Place 1 patch onto the skin every 3 (three) days. Patient has drug agreement,  ? gabapentin (NEURONTIN) 100 MG capsule Take 1 capsule (100 mg total) by mouth 3 (three) times daily.  ? lisinopril (PRINIVIL,ZESTRIL) 10 MG tablet Take 10 mg by mouth daily.  ? meloxicam (MOBIC) 15 MG tablet Take 0.5 tablets (7.5 mg total) by mouth 2 (two) times daily.  ? omeprazole (PRILOSEC) 40 MG capsule Take 1 capsule by mouth daily.  ? sertraline (ZOLOFT) 25 MG tablet Take 25 mg by mouth daily.  ? triamterene-hydrochlorothiazide (MAXZIDE-25) 37.5-25 MG tablet Take 0.5 tablets by mouth daily.  ? ? ?Allergies  ?Allergen Reactions  ? Codeine   ? Penicillins   ? ? ?SOCIAL HISTORY/FAMILY HISTORY  ? ?Reviewed in Epic:  ?Pertinent findings:  ?Social History  ? ?Tobacco Use  ? Smoking status: Former  ?  Types: Cigarettes  ?  Quit date:  04/03/1999  ?  Years since quitting: 22.3  ? Smokeless tobacco: Never  ?Substance Use Topics  ? Alcohol use: Yes  ?  Alcohol/week: 1.0 standard drink  ?  Types: 1 Glasses of wine per week  ?  Comment: occ [place]

## 2021-08-07 NOTE — Patient Instructions (Addendum)
Medication Instructions:  ?NO CHANGES ? ? ?*If you need a refill on your cardiac medications before your next appointment, please call your pharmacy* ? ? ?Lab Work: ?Not needed ? ? ? ?Testing/Procedure ?Your physician has recommended that you wear a holter monitor 7 day Zio . Holter monitors are medical devices that record the heart?s electrical activity. Doctors most often use these monitors to diagnose arrhythmias. Arrhythmias are problems with the speed or rhythm of the heartbeat. The monitor is a small, portable device. You can wear one while you do your normal daily activities. This is usually used to diagnose what is causing palpitations/syncope (passing out).  ? ? ?Follow-Up: ?At [facility], you and your health needs are our priority.  As part of our continuing mission to provide you with exceptional heart care, we have created designated Provider Care Teams.  These Care Teams include your primary Cardiologist (physician) and Advanced Practice Providers (APPs -  Physician Assistants and Nurse Practitioners) who all work together to provide you with the care you need, when you need it. ? ?  ? ?Your next appointment:   ?2 month(s) ? ?The format for your next appointment:   ?In Person ? ?Provider:   ?None  ? ? ?Other Instructions  ? ?ZIO XT- Long Term Monitor Instructions ? ?Your physician has requested you wear a ZIO patch monitor for 7 days.  ?This is a single patch monitor. [employer] supplies one patch monitor per enrollment. Additional ?stickers are not available. Please do not apply patch if you will be having a Nuclear Stress Test,  ?Echocardiogram, Cardiac CT, MRI, or Chest Xray during the period you would be wearing the  ?monitor. The patch cannot be worn during these tests. You cannot remove and re-apply the  ?ZIO XT patch monitor.  ?Your ZIO patch monitor will be mailed 3 day USPS to your address on file. It may take 3-5 days  ?to receive your monitor after you have been enrolled.  ?Once you have  received your monitor, please review the enclosed instructions. Your monitor  ?has already been registered assigning a specific monitor serial # to you. ? ?Billing and Patient Assistance Program Information ? ?We have supplied [employer] with any of your insurance information on file for billing purposes. ?[employer] offers a sliding scale Patient Assistance Program for patients that do not have  ?insurance, or whose insurance does not completely cover the cost of the ZIO monitor.  ?You must apply for the Patient Assistance Program to qualify for this discounted rate.  ?To apply, please call [employer] at [phone], select option 4, select option 2, ask to apply for  ?Patient Assistance Program. Theodore Demark will ask your household income, and how many people  ?are in your household. They will quote your out-of-pocket cost based on that information.  ?[employer] will also be able to set up a 52-month, interest-free payment plan if needed. ? ?Applying the monitor ?  ?Shave hair from upper left chest.  ?Hold abrader disc by orange tab. Rub abrader in 40 strokes over the upper left chest as  ?indicated in your monitor instructions.  ?Clean area with 4 enclosed alcohol pads. Let dry.  ?Apply patch as indicated in monitor instructions. Patch will be placed under collarbone on left  ?side of chest with arrow pointing upward.  ?Rub patch adhesive wings for 2 minutes. Remove white label marked "1". Remove the white  ?label marked "2". Rub patch adhesive wings for 2 additional minutes.  ?While looking in a mirror, press  and release button in center of patch. A small green light will  ?flash 3-4 times. This will be your only indicator that the monitor has been turned on.  ?Do not shower for the first 24 hours. You may shower after the first 24 hours.  ?Press the button if you feel a symptom. You will hear a small click. Record Date, Time and  ?Symptom in the Patient Logbook.  ?When you are ready to remove the patch, follow instructions on  the last 2 pages of Patient  ?Logbook. Stick patch monitor onto the last page of Patient Logbook.  ?Place Patient Logbook in the blue and white box. Use locking tab on box and tape box closed  ?securely. The blue and white box has prepaid postage on it. Please place it in the mailbox as  ?soon as possible. Your physician should have your test results approximately 7 days after the  ?monitor has been mailed back to [facility]  ?Call [facility] at [phone] if you have questions regarding  ?your ZIO XT patch monitor. Call them immediately if you see an orange light blinking on your  ?monitor.  ?If your monitor falls off in less than 4 days, contact our Monitor department at [phone].  ?If your monitor becomes loose or falls off after 4 days call [employer] at [phone] for  ?suggestions on securing your monitor  ?

## 2021-08-07 NOTE — Progress Notes (Unsigned)
Enrolled for [employer] to mail a ZIO XT long term holter monitor to the patients address on file.  

## 2021-08-08 ENCOUNTER — Encounter: Payer: Self-pay | Admitting: Cardiology

## 2021-08-08 NOTE — Assessment & Plan Note (Signed)
Artery sclerosis.  Minimal murmur on exam.  Probably not worth evaluating with echo. ?

## 2021-08-08 NOTE — Assessment & Plan Note (Signed)
Blood pressure is high today, I think she is very anxious.  We can reassess.  She does have room if necessary to use a beta-blocker since she is already on amlodipine, lisinopril and Maxide. ? ?Essentially resistant hypertension. ?

## 2021-08-08 NOTE — Assessment & Plan Note (Signed)
In the past, she did not have any significant arrhythmias. ? ?We will reassess with a 7-day Zio patch, if that does not show STEMI, we can consider beta-blocker attempted to treat her symptoms.  Would like to mmHg however. ? ?Structurally normal echocardiogram.  No need to be rechecked. ?

## 2021-08-09 DIAGNOSIS — R002 Palpitations: Secondary | ICD-10-CM

## 2021-08-31 HISTORY — PX: OTHER SURGICAL HISTORY: SHX169

## 2021-09-11 DIAGNOSIS — M48061 Spinal stenosis, lumbar region without neurogenic claudication: Secondary | ICD-10-CM | POA: Diagnosis not present

## 2021-09-11 DIAGNOSIS — M25561 Pain in right knee: Secondary | ICD-10-CM | POA: Diagnosis not present

## 2021-09-11 DIAGNOSIS — M47817 Spondylosis without myelopathy or radiculopathy, lumbosacral region: Secondary | ICD-10-CM | POA: Diagnosis not present

## 2021-09-11 DIAGNOSIS — M5416 Radiculopathy, lumbar region: Secondary | ICD-10-CM | POA: Diagnosis not present

## 2021-09-19 ENCOUNTER — Ambulatory Visit (INDEPENDENT_AMBULATORY_CARE_PROVIDER_SITE_OTHER): Payer: BC Managed Care – PPO | Admitting: Neurology

## 2021-09-19 ENCOUNTER — Encounter: Payer: Self-pay | Admitting: Neurology

## 2021-09-19 VITALS — BP 136/81 | HR 67 | Ht 63.0 in | Wt 142.5 lb

## 2021-09-19 DIAGNOSIS — G8929 Other chronic pain: Secondary | ICD-10-CM | POA: Diagnosis not present

## 2021-09-19 DIAGNOSIS — M5440 Lumbago with sciatica, unspecified side: Secondary | ICD-10-CM

## 2021-09-19 NOTE — Progress Notes (Signed)
Patient: Morgan Swanson Date of Birth: Nov 12, 1950  Reason for Visit: Follow up History from: Patient Primary Neurologist: Dr.Yan  ASSESSMENT AND PLAN 71 y.o. year old female   1.  Lumbar stenosis -MRI lumbar spine in 2018 showed severe stenosis L5-S1, L4-5 -EMG/NCV showed mild chronic changes, no evidence of active process in December 2022 -In 2018 neurosurgical evaluation suggested conservative management -Chronic pain has been managed with fentanyl patch 12 mcg every 72 hours, gabapentin 100 mg 3 times daily, occasional ESI, baclofen  -Continue follow-up with pain management, return to our clinic for any new or worsening symptoms  HISTORY  Morgan Swanson here to follow-up lumbar stenosis, her primary care is nurse practitioner Jonah Blue    She had a past medical history of hypertension, hyperlipidemia.       She presented for low back pain since 2008, her low back pain radiating to her right lower extremity, involving right plantar foot, she denies significant weakness, no gait difficulty, she has been treated with fentanyl patch69mcg/q72 hours, which has been very helpful, she only has minimal discomfort. She denies bowel or bladder incontinence.    MRI lumbar was in 11/2006, which showed grade 1 anterolisthesis L4-5 with moderate to severe facet hypertrophy and moderate to severe central canal stenosis, moderate to severe right foraminal narrowing, right greater than left at L4-5.    She has  been doing her exercises, HEP. She is also  taking her gabapentin. She was recently placed trazodone for sleep with good benefit. She is raising her 4 grandchildren. She continues to work full-time. Her work restrictions are no lifting over 20 pounds She returns for reevaluation.    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 She has still numbness at top of right foot, lateral leg, mild unsteady gait, no incontinence,    She was seen by neurosurgeon Dr. Mikal Plane on October 08 2016: Profound stenosis at L4-5 secondary to ligamentum flavum hypertrophy, with the cyst, facet hypertrophy, even worse facet hypertrophy L5-S1,   patient is in significant pain, 6 out of 10, with movement Up to 9 Out Of 10,     She continues to use fentanyl patch.  Has been helpful,   Update March 15, 2021: She is using gabapentin 100 mg 3 times a day, has been on fentanyl patch low-dose 12 mcg every hour every 3 days for many years, recent flareup of worsening low back pain, radiating pain to right lower extremity, received epidural injection in March 2022 has been helpful  She wants to stay on fentanyl patch, explained to her, our office is moving away from chronic narcotic prescription, she is waiting to continue by pain management,   Personally reviewed MRI of lumbar spine 2018, severe spinal stenosis L4-5, worse in the transverse diameter, with 8 to 9 mm anterolisthesis, disc  herniation and severe facet hypertrophy, severe bilateral foraminal stenosis, severe stenosis L5-S1, facet hypertrophy,  She return for EMG nerve conduction study today, which only showed slight chronic neuropathic changes, no evidence of active process,  Update September 19, 2021 SS: Seeing pain management, Dr. Vear Clock, giving the Fentanyl, baclofen, gabapentin.  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 Right knee pain, getting x-rays. 1 year ago had ESI with Dr. Murray Hodgkins. Continues with right sided back pain, radiates to right lower extremity.  Symptoms stable.  REVIEW OF SYSTEMS: Out of a complete 14 system review of symptoms, the patient complains only of the following symptoms, and all other reviewed systems are negative.  See HPI  ALLERGIES: Allergies  Allergen Reactions   Codeine    Lisinopril Cough   Penicillins     HOME MEDICATIONS: Outpatient Medications Prior to Visit  Medication Sig Dispense Refill   amLODipine (NORVASC) 10 MG tablet Take 1 tablet by mouth daily.     baclofen (LIORESAL) 10 MG tablet Take 1 tablet (10 mg total) by mouth at bedtime as needed for muscle spasms. 30 each 3   busPIRone (BUSPAR) 7.5 MG tablet Take 7.5 mg by mouth 2 (two) times daily.     Cholecalciferol (VITAMIN D3) 50000 units CAPS Take 50,000 Units by mouth once a week.     CRESTOR 20 MG tablet Take 1 tablet by mouth daily.     fentaNYL (DURAGESIC) 12 MCG/HR Place 1 patch onto the skin every 3 (three) days. Patient has drug agreement, 10 patch 0   gabapentin (NEURONTIN) 100 MG capsule Take 1 capsule (100 mg total) by mouth 3 (three) times daily. 270 capsule 4   meloxicam (MOBIC) 15 MG tablet Take 0.5 tablets (7.5 mg total) by mouth 2 (two) times daily. 30 tablet 5   omeprazole (PRILOSEC) 40 MG capsule Take 1 capsule by mouth daily.     sertraline (ZOLOFT) 25 MG tablet Take 25 mg by mouth daily.     triamterene-hydrochlorothiazide (MAXZIDE-25) 37.5-25 MG tablet Take 0.5 tablets by mouth daily.  0   lisinopril (PRINIVIL,ZESTRIL) 10 MG tablet Take 10 mg by mouth daily.  0   No facility-administered medications prior to visit.    PAST MEDICAL HISTORY: Past Medical History:  Diagnosis Date   Arthritis    back    Back pain    Cataract    small immature   Depression    GERD (gastroesophageal reflux disease)    Hyperlipidemia     Hypertension    Spinal stenosis, lumbar region, without neurogenic claudication     PAST SURGICAL HISTORY: Past Surgical History:  Procedure Laterality Date   CHOLECYSTECTOMY     HAND SURGERY     fatty tissue removed    TRANSTHORACIC ECHOCARDIOGRAM  12/2001; 09/2016   a.Normal LV size and function. EF 55-65%. Normal regional wall motion. No valvular lesions;; b. October 04, 2016: Normal LV size with mild LV hypertrophy. EF 55-60%. Mildly increased RV size with normal systolic function. No significant valvular abnormalities   VAGINAL HYSTERECTOMY  1983    FAMILY HISTORY: Family History  Problem Relation Age of Onset   Breast cancer Mother    Diabetes Mother    Fibromyalgia Mother    Lung cancer Father    Stroke Brother    Colon cancer Neg Hx    Colon polyps Neg Hx    Esophageal cancer Neg Hx    Rectal cancer Neg Hx    Stomach cancer Neg Hx  SOCIAL HISTORY: Social History   Socioeconomic History   Marital status: Single    Spouse name: Not on file   Number of children: 2   Years of education: HS   Highest education level: Not on file  Occupational History   Occupation: Cook    Employer: [employer]  Tobacco Use   Smoking status: Former    Types: Cigarettes    Quit date: 04/03/1999    Years since quitting: 22.4   Smokeless tobacco: Never  Substance and Sexual Activity   Alcohol use: Yes    Alcohol/week: 1.0 standard drink of alcohol    Types: 1 Glasses of wine per week    Comment: occ Holidays   Drug use: No   Sexual activity: Not on file  Other Topics Concern   Not on file  Social History Narrative   Pt lives at home with her four teenage grandchildren - has legal custody.    Patient is single.   Works for [employer] as a [employer].   Caffeine Use- 2 cups of tea daily.   Right handed.   Social Determinants of Health   Financial Resource Strain: Not on file  Food Insecurity: Not on file  Transportation Needs: Not on file   Physical Activity: Not on file  Stress: Not on file  Social Connections: Not on file  Intimate Partner Violence: Not on file    PHYSICAL EXAM  Vitals:   09/19/21 1418  BP: 136/81  Pulse: 67  Weight: 142 lb 8 oz (64.6 kg)  Height: 5\' 3"  (1.6 m)   Body mass index is 25.24 kg/m.  Generalized: Well developed, in no acute distress  Neurological examination  Mentation: Alert oriented to time, place, history taking. Follows all commands speech and language fluent Cranial nerve II-XII: Pupils were equal round reactive to light. Extraocular movements were full, visual field were full on confrontational test. Facial sensation and strength were normal. Head turning and shoulder shrug  were normal and symmetric. Motor: right 4/5 hip flexion Sensory: Sensory testing is intact to soft touch on all 4 extremities. No evidence of extinction is noted.  Coordination: Cerebellar testing reveals good finger-nose-finger and heel-to-shin bilaterally.  Gait and station: Limp on the right, antalgic, gait is independent Reflexes: Deep tendon reflexes are symmetric and normal bilaterally.   DIAGNOSTIC DATA (LABS, IMAGING, TESTING) - I reviewed patient records, labs, notes, testing and imaging myself where available.  Lab Results  Component Value Date   WBC 6.5 12/11/2006   HGB 12.8 12/11/2006   HCT 37.1 12/11/2006   MCV 89.6 12/11/2006   PLT 387 12/11/2006      Component Value Date/Time   NA 139 12/11/2006 1553   K 3.6 12/11/2006 1553   CL 103 12/11/2006 1553   CO2 28 12/11/2006 1553   GLUCOSE 86 12/11/2006 1553   BUN 13 12/11/2006 1553   CREATININE 0.7 12/11/2006 1553   CALCIUM 9.2 12/11/2006 1553   GFRNONAA 91 12/11/2006 1553   GFRAA 111 12/11/2006 1553   No results found for: "CHOL", "HDL", "LDLCALC", "LDLDIRECT", "TRIG", "CHOLHDL" No results found for: "HGBA1C" No results found for: "VITAMINB12" No results found for: "TSH"  02/10/2007, Margie Ege, DNP 09/19/2021, 2:40 PM [facility] [address], [address] [place], [place] [phone]

## 2021-09-27 ENCOUNTER — Other Ambulatory Visit: Payer: Self-pay | Admitting: Anesthesiology

## 2021-09-27 ENCOUNTER — Ambulatory Visit
Admission: RE | Admit: 2021-09-27 | Discharge: 2021-09-27 | Disposition: A | Discharge status: Home / Self Care | Payer: BC Managed Care – PPO | Source: Ambulatory Visit | Attending: Anesthesiology | Admitting: Anesthesiology

## 2021-09-27 DIAGNOSIS — R52 Pain, unspecified: Secondary | ICD-10-CM

## 2021-10-09 ENCOUNTER — Ambulatory Visit (INDEPENDENT_AMBULATORY_CARE_PROVIDER_SITE_OTHER): Payer: BC Managed Care – PPO | Admitting: Cardiology

## 2021-10-09 ENCOUNTER — Encounter: Payer: Self-pay | Admitting: Cardiology

## 2021-10-09 VITALS — BP 136/82 | HR 65 | Ht 63.0 in | Wt 139.0 lb

## 2021-10-09 DIAGNOSIS — R011 Cardiac murmur, unspecified: Secondary | ICD-10-CM

## 2021-10-09 DIAGNOSIS — M48061 Spinal stenosis, lumbar region without neurogenic claudication: Secondary | ICD-10-CM

## 2021-10-09 DIAGNOSIS — R002 Palpitations: Secondary | ICD-10-CM | POA: Diagnosis not present

## 2021-10-09 DIAGNOSIS — I10 Essential (primary) hypertension: Secondary | ICD-10-CM

## 2021-10-09 MED ORDER — METOPROLOL TARTRATE 25 MG PO TABS: 25.0000 mg | ORAL_TABLET | Freq: Two times a day (BID) | ORAL | 3 refills | Status: DC

## 2021-10-09 NOTE — Progress Notes (Signed)
Primary Care Provider: Dot Been, FNP   ([facility]) Cardiologist: Bryan Lemma, MD Electrophysiologist: None  Clinic Note: Chief Complaint  Patient presents with   Follow-up    2 months.-Monitor results   Edema   Palpitations    Still having memory, but not overly worrisome.   ===================================  ASSESSMENT/PLAN   Problem List Items Addressed This Visit       Cardiology Problems   Essential hypertension (Chronic)    Borderline elevated BP today on amlodipine and Maxide.  She can likely tolerate addition of beta-blocker.  We will start with low-dose Lopressor-12.5 mg twice daily for 1 month but then if tolerated increase to 25 mg twice daily.      Relevant Medications   metoprolol tartrate (LOPRESSOR) 25 MG tablet     Other   Heart palpitations - Primary (Chronic)    No sustained arrhythmias noted on the monitor, but she did have lots of PACs and short PAT runs.  They are relatively symptomatic and so we will consider starting a low-dose beta-blocker. Start blood pressure 12.5 mg twice daily if tolerated after 1 month increase to 25 mg twice daily.  Continue encourage adequate hydration  Reassess in 6 months      Systolic murmur (Chronic)    Previous echo did not only show any significant valvular abnormality.  Only some mild aortic sclerosis.      Spinal stenosis, lumbar region, without neurogenic claudication   ===================================  HPI:    Morgan Swanson is a 71 y.o. female with a PMH notable for HTN, HLD, prediabetes with chronic pain/lumbar radiculopathy who is being seen today for 26-Month Follow-UpReevaluation of Heart Palpitations at the request of Dot Been, FNP.  ([facility])  I had initially seen Morgan Swanson back in June and August 2018 for heart fluttering.  When I saw her back in August the flutters are notably improved.  The episodes are usually  associated during social stress situations. => Work-up was benign with normal echocardiogram and monitor.  Morgan Swanson was seen for return consultation on May 2023 for reassessment of palpitations, at the request of Sherron Flemings, FNP after their July 17, 2021 clinic follow-up. She noted having more frequent episodes of palpitations that occur in clusters-happening more days out of the week, and more often during the day-up to 4-5 x per day.  No real rhyme or reason to when they occur.  Often when she is stressed about night but not during sleep - often during Lunch "[employer] - busiest time @ work (she is the "[employer]" @ one of the [employer] -she had recently changed to a different school where she does not know as many other people.  Lots of less than favorable social interactions with the move, especially at age 63.  This has her thinking about retiring all, although she has no idea what she would do otherwise).  No excess use of coffee or sodas-she only drinks decaf coffee tea and occasional ginger ale to settle her stomach.  Otherwise stable cardiac standpoint. Zio patch monitor ordered  Recent Hospitalizations: None  Reviewed  CV studies:    The following studies were reviewed today: (if available, images/films reviewed: From Epic Chart or Care Everywhere) Zio patch monitor: Patch Wear Time:  6 days and 22 hours (2023-05-10T18:03:55-0400 to 2023-05-17T16:39:02-399)   Predominant Rhythm: Sinus - HR Range 42-121 bpm, AVG 67 bpm.   Multiple (157)  runs of PAT: Fastest- 5&6 beats, max 184 bpm; longest 16 beats (7.7 Sec) avg rate 128 bpm   Occasional (3.3%) isolated PACs with rare couplets and triplets.   Rare isolated PVCs.   Symptoms noted mostly with sinus rhythm and PACs, occasional short bursts of PAT also noted.  Interestingly, the longest and fastest episodes were not noted on diary.   Interval History:   Morgan Swanson presents here to discuss results of her  monitor.  She pressed the button quite a bit, and mostly was for PACs and not necessarily the 157 PAT runs.  A couple of them were documented, but for the most part is just the PACs.  She says that since the summer vacation started, she is a lot less stressed and is not having as much the palpitations.  She notes that they are still there, just not up to 4 times a day.   Otherwise she has some mild lower extremity swelling but no PND, orthopnea.  No chest pain or pressure at rest or exertion.  No exertional dyspnea.  No syncope or near syncope.  CV Review of Symptoms (Summary) Cardiovascular ROS: positive for - irregular heartbeat, palpitations, and   described as an unusual sensation in her chest, that makes her feel tired afterwards.-Less frequent negative for - chest pain, dyspnea on exertion, edema, orthopnea, paroxysmal nocturnal dyspnea, rapid heart rate, shortness of breath, or lightheadedness, dizziness or wooziness, syncope/near syncope or TIA/amaurosis fugax claudication  REVIEWED OF SYSTEMS   Review of Systems  Constitutional:  Positive for malaise/fatigue (When she has a lots of palpitations, she feels a lot of fatigue.). Negative for weight loss.  HENT: Negative.    Respiratory: Negative.    Cardiovascular:        Per HPI  Gastrointestinal:  Negative for blood in stool and melena.  Genitourinary:  Negative for hematuria.  Musculoskeletal:  Positive for joint pain.  Neurological:  Negative for dizziness and focal weakness.  Psychiatric/Behavioral:  Negative for memory loss. The patient is not nervous/anxious (Less stressful during the summer).        Social stress is gone because of summertime.  Palpitations less frequent.    I have reviewed and (if needed) personally updated the patient's problem list, medications, allergies, past medical and surgical history, social and family history.   PAST MEDICAL HISTORY   Past Medical History:  Diagnosis Date   Arthritis    back     Back pain    Cataract    small immature   Depression    GERD (gastroesophageal reflux disease)    Hyperlipidemia    Hypertension    Spinal stenosis, lumbar region, without neurogenic claudication     PAST SURGICAL HISTORY   Past Surgical History:  Procedure Laterality Date   30-Day Event Monitor  08/2016   15 events noted. Mostly sinus rhythm with occasional bradycardia. Low rate 50. Average rate 70 bpm. Rare PACs but no runs or couplets. No arrhythmia noted.   CHOLECYSTECTOMY     HAND SURGERY     fatty tissue removed    TRANSTHORACIC ECHOCARDIOGRAM  12/2001; 09/2016   a.Normal LV size and function. EF 55-65%. Normal regional wall motion. No valvular lesions;; b. October 04, 2016: Normal LV size with mild LV hypertrophy. EF 55-60%. Mildly increased RV size with normal systolic function. No significant valvular abnormalities   VAGINAL HYSTERECTOMY  1983   Zio Patch Monitor  08/2021   Mostly sinus rhythm, occasional (3.3%) isolated PACs.  Multiple (157) runs of PAT: Fastest- 5&6 beats, max 184 bpm; longest 16 beats (7.7 Sec) avg rate 128 bpm; symptoms mostly noted with sinus rhythm and PACs.  137 triggers most were not SVT/PAT episodes.    Immunization History  Administered Date(s) Administered   [employer] Sars-Covid-2 Vaccination 04/26/2019, 05/24/2019    MEDICATIONS/ALLERGIES   Current Meds  Medication Sig   amLODipine (NORVASC) 10 MG tablet Take 1 tablet by mouth daily.   baclofen (LIORESAL) 10 MG tablet Take 1 tablet (10 mg total) by mouth at bedtime as needed for muscle spasms.   busPIRone (BUSPAR) 7.5 MG tablet Take 7.5 mg by mouth 2 (two) times daily.   Cholecalciferol (VITAMIN D3) 50000 units CAPS Take 50,000 Units by mouth once a week.   CRESTOR 20 MG tablet Take 1 tablet by mouth daily.   fentaNYL (DURAGESIC) 12 MCG/HR Place 1 patch onto the skin every 3 (three) days. Patient has drug agreement,   gabapentin (NEURONTIN) 100 MG capsule Take 1 capsule (100 mg total) by mouth 3  (three) times daily.   meloxicam (MOBIC) 15 MG tablet Take 0.5 tablets (7.5 mg total) by mouth 2 (two) times daily.   omeprazole (PRILOSEC) 40 MG capsule Take 1 capsule by mouth daily.   sertraline (ZOLOFT) 25 MG tablet Take 25 mg by mouth daily.   triamterene-hydrochlorothiazide (MAXZIDE-25) 37.5-25 MG tablet Take 0.5 tablets by mouth daily.    Allergies  Allergen Reactions   Codeine    Lisinopril Cough   Penicillins     SOCIAL HISTORY/FAMILY HISTORY   Reviewed in Epic:  Pertinent findings:  Social History   Tobacco Use   Smoking status: Former    Types: Cigarettes    Quit date: 04/03/1999    Years since quitting: 22.5   Smokeless tobacco: Never  Substance Use Topics   Alcohol use: Yes    Alcohol/week: 1.0 standard drink of alcohol    Types: 1 Glasses of wine per week    Comment: occ Holidays   Drug use: No   Social History   Social History Narrative   Pt lives at home with her four teenage grandchildren - has legal custody.    Patient is single.   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   Caffeine Use- 2 cups of tea daily.   Right handed.    OBJCTIVE -PE, EKG, labs   Wt Readings from Last 3 Encounters:  10/09/21 139 lb (63 kg)  09/19/21 142 lb 8 oz (64.6 kg)  08/07/21 146 lb (66.2 kg)    Physical Exam: BP 136/82 (BP Location: Left Arm, Patient Position: Sitting, Cuff Size: Normal)   Pulse 65   Ht 5\' 3"  (1.6 m)   Wt 139 lb (63 kg)   BMI 24.62 kg/m  Physical Exam Vitals reviewed.  Constitutional:      General: She is not in acute distress.    Appearance: She is normal weight. She is not ill-appearing or toxic-appearing.  HENT:     Head: Normocephalic and atraumatic.  Neck:     Vascular: No carotid bruit, hepatojugular reflux or JVD.  Cardiovascular:     Rate and Rhythm: Normal rate and regular rhythm. Occasional Extrasystoles are present.    Chest Wall: PMI is not displaced.     Pulses: Normal pulses and intact distal pulses.      Heart sounds: S1 normal and S2 normal. No murmur heard.    No friction rub. No gallop.  Pulmonary:  Effort: Pulmonary effort is normal. No respiratory distress.     Breath sounds: Normal breath sounds. No wheezing, rhonchi or rales.  Abdominal:     General: Abdomen is flat. Bowel sounds are normal. There is no distension.     Palpations: Abdomen is soft. There is no mass.     Tenderness: There is no abdominal tenderness.     Comments: No HSM or bruit  Musculoskeletal:        General: No swelling.     Cervical back: Full passive range of motion without pain and normal range of motion.  Skin:    General: Skin is warm and dry.     Coloration: Skin is not jaundiced.  Neurological:     General: No focal deficit present.     Mental Status: She is alert and oriented to person, place, and time.     Cranial Nerves: No cranial nerve deficit.     Motor: No weakness.  Psychiatric:        Mood and Affect: Mood normal.        Behavior: Behavior normal.        Thought Content: Thought content normal.        Judgment: Judgment normal.      Adult ECG Report  Rate: 56;  Rhythm: sinus bradycardia and left atrial enlargement.  Otherwise normal axis, intervals and durations. ;   Narrative Interpretation: Stable  Recent Labs: No labs available. No results found for: "CHOL", "HDL", "LDLCALC", "LDLDIRECT", "TRIG", "CHOLHDL" Lab Results  Component Value Date   CREATININE 0.7 12/11/2006   BUN 13 12/11/2006   NA 139 12/11/2006   K 3.6 12/11/2006   CL 103 12/11/2006   CO2 28 12/11/2006      Latest Ref Rng & Units 12/11/2006    3:53 PM  CBC  WBC 4.5 - 10.5 10*3/microliter 6.5   Hemoglobin 12.0 - 15.0 g/dL 50.2   Hematocrit 77.4 - 46.0 % 37.1   Platelets 150 - 400 K/uL 387     No results found for: "HGBA1C" No results found for: "TSH"  ==================================================  COVID-19 Education: The signs and symptoms of COVID-19 were discussed with the patient and how to  seek care for testing (follow up with PCP or arrange E-visit).    I spent a total of 19 minutes with the patient spent in direct patient consultation.  Additional time spent with chart review  / charting (studies, outside notes, etc): 14 min Total Time: 33 min  Current medicines are reviewed at length with the patient today.  (+/- concerns) N/A  This visit occurred during the SARS-CoV-2 public health emergency.  Safety protocols were in place, including screening questions prior to the visit, additional usage of staff PPE, and extensive cleaning of exam room while observing appropriate contact time as indicated for disinfecting solutions.  Notice: This dictation was prepared with Dragon dictation along with smart phrase technology. Any transcriptional errors that result from this process are unintentional and may not be corrected upon review.  Studies Ordered:   No orders of the defined types were placed in this encounter.  Meds ordered this encounter  Medications   metoprolol tartrate (LOPRESSOR) 25 MG tablet    Sig: Take 1 tablet (25 mg total) by mouth 2 (two) times daily.    Dispense:  180 tablet    Refill:  3    Patient Instructions / Medication Changes & Studies & Tests Ordered   Patient Instructions  Medication Instructions:   Lopressor (  metoprolol tartrate) 25 mg  twice a day  --  for the first 2 weeks  -12.5 mg twice a day -then increase to 25 mg   *If you need a refill on your cardiac medications before your next appointment, please call your pharmacy*   Lab Work: Not needed    Testing/Procedures: Not needed   Follow-Up: At [facility], you and your health needs are our priority.  As part of our continuing mission to provide you with exceptional heart care, we have created designated Provider Care Teams.  These Care Teams include your primary Cardiologist (physician) and Advanced Practice Providers (APPs -  Physician Assistants and Nurse Practitioners) who all  work together to provide you with the care you need, when you need it.     Your next appointment:   6 month(s)  The format for your next appointment:   In Person  Provider:   Bryan Lemma, MD    Other Instructions   Hydrate ,hydrate      Bryan Lemma, M.D., M.S. Interventional Cardiologist   Pager # [phone] Phone # [phone] [address] [place], [place] [address]   Thank you for choosing [facility] at [facility]!!

## 2021-10-09 NOTE — Patient Instructions (Addendum)
Medication Instructions:   Lopressor ( metoprolol tartrate) 25 mg  twice a day  --  for the first 2 weeks  -12.5 mg twice a day -then increase to 25 mg   *If you need a refill on your cardiac medications before your next appointment, please call your pharmacy*   Lab Work: Not needed    Testing/Procedures: Not needed   Follow-Up: At [facility], you and your health needs are our priority.  As part of our continuing mission to provide you with exceptional heart care, we have created designated Provider Care Teams.  These Care Teams include your primary Cardiologist (physician) and Advanced Practice Providers (APPs -  Physician Assistants and Nurse Practitioners) who all work together to provide you with the care you need, when you need it.     Your next appointment:   6 month(s)  The format for your next appointment:   In Person  Provider:   Bryan Lemma, MD    Other Instructions   Hydrate ,hydrate

## 2021-10-15 ENCOUNTER — Encounter: Payer: Self-pay | Admitting: Cardiology

## 2021-10-15 NOTE — Assessment & Plan Note (Signed)
Borderline elevated BP today on amlodipine and Maxide.  She can likely tolerate addition of beta-blocker.  We will start with low-dose Lopressor-12.5 mg twice daily for 1 month but then if tolerated increase to 25 mg twice daily.

## 2021-10-15 NOTE — Assessment & Plan Note (Signed)
Previous echo did not only show any significant valvular abnormality.  Only some mild aortic sclerosis.

## 2021-10-15 NOTE — Assessment & Plan Note (Addendum)
No sustained arrhythmias noted on the monitor, but she did have lots of PACs and short PAT runs.  They are relatively symptomatic and so we will consider starting a low-dose beta-blocker. Start blood pressure 12.5 mg twice daily if tolerated after 1 month increase to 25 mg twice daily.  Continue encourage adequate hydration  Reassess in 6 months

## 2021-11-07 DIAGNOSIS — M47817 Spondylosis without myelopathy or radiculopathy, lumbosacral region: Secondary | ICD-10-CM | POA: Diagnosis not present

## 2021-11-07 DIAGNOSIS — M25561 Pain in right knee: Secondary | ICD-10-CM | POA: Diagnosis not present

## 2021-11-07 DIAGNOSIS — M48061 Spinal stenosis, lumbar region without neurogenic claudication: Secondary | ICD-10-CM | POA: Diagnosis not present

## 2021-11-07 DIAGNOSIS — M5416 Radiculopathy, lumbar region: Secondary | ICD-10-CM | POA: Diagnosis not present

## 2021-11-29 DIAGNOSIS — Z01818 Encounter for other preprocedural examination: Secondary | ICD-10-CM | POA: Diagnosis not present

## 2021-11-29 DIAGNOSIS — H25812 Combined forms of age-related cataract, left eye: Secondary | ICD-10-CM | POA: Diagnosis not present

## 2021-11-29 DIAGNOSIS — H25811 Combined forms of age-related cataract, right eye: Secondary | ICD-10-CM | POA: Diagnosis not present

## 2021-12-01 DIAGNOSIS — H269 Unspecified cataract: Secondary | ICD-10-CM | POA: Diagnosis not present

## 2021-12-01 DIAGNOSIS — H25812 Combined forms of age-related cataract, left eye: Secondary | ICD-10-CM | POA: Diagnosis not present

## 2021-12-15 DIAGNOSIS — H269 Unspecified cataract: Secondary | ICD-10-CM | POA: Diagnosis not present

## 2021-12-15 DIAGNOSIS — H25811 Combined forms of age-related cataract, right eye: Secondary | ICD-10-CM | POA: Diagnosis not present

## 2022-01-02 DIAGNOSIS — M5416 Radiculopathy, lumbar region: Secondary | ICD-10-CM | POA: Diagnosis not present

## 2022-01-02 DIAGNOSIS — M48061 Spinal stenosis, lumbar region without neurogenic claudication: Secondary | ICD-10-CM | POA: Diagnosis not present

## 2022-01-02 DIAGNOSIS — M47817 Spondylosis without myelopathy or radiculopathy, lumbosacral region: Secondary | ICD-10-CM | POA: Diagnosis not present

## 2022-01-02 DIAGNOSIS — G894 Chronic pain syndrome: Secondary | ICD-10-CM | POA: Diagnosis not present

## 2022-02-27 DIAGNOSIS — M47817 Spondylosis without myelopathy or radiculopathy, lumbosacral region: Secondary | ICD-10-CM | POA: Diagnosis not present

## 2022-02-27 DIAGNOSIS — G894 Chronic pain syndrome: Secondary | ICD-10-CM | POA: Diagnosis not present

## 2022-02-27 DIAGNOSIS — M48061 Spinal stenosis, lumbar region without neurogenic claudication: Secondary | ICD-10-CM | POA: Diagnosis not present

## 2022-02-27 DIAGNOSIS — M5416 Radiculopathy, lumbar region: Secondary | ICD-10-CM | POA: Diagnosis not present

## 2022-04-24 DIAGNOSIS — M48061 Spinal stenosis, lumbar region without neurogenic claudication: Secondary | ICD-10-CM | POA: Diagnosis not present

## 2022-04-24 DIAGNOSIS — M47817 Spondylosis without myelopathy or radiculopathy, lumbosacral region: Secondary | ICD-10-CM | POA: Diagnosis not present

## 2022-04-24 DIAGNOSIS — M5416 Radiculopathy, lumbar region: Secondary | ICD-10-CM | POA: Diagnosis not present

## 2022-04-24 DIAGNOSIS — G894 Chronic pain syndrome: Secondary | ICD-10-CM | POA: Diagnosis not present

## 2022-09-16 ENCOUNTER — Other Ambulatory Visit: Payer: Self-pay | Admitting: Cardiology

## 2022-12-25 ENCOUNTER — Other Ambulatory Visit: Payer: Self-pay | Admitting: Family Medicine

## 2022-12-25 DIAGNOSIS — Z13820 Encounter for screening for osteoporosis: Secondary | ICD-10-CM

## 2023-03-11 ENCOUNTER — Ambulatory Visit (INDEPENDENT_AMBULATORY_CARE_PROVIDER_SITE_OTHER): Payer: BC Managed Care – PPO | Admitting: Orthopaedic Surgery

## 2023-03-11 ENCOUNTER — Encounter: Payer: Self-pay | Admitting: Orthopaedic Surgery

## 2023-03-11 DIAGNOSIS — G8929 Other chronic pain: Secondary | ICD-10-CM | POA: Diagnosis not present

## 2023-03-11 DIAGNOSIS — M1711 Unilateral primary osteoarthritis, right knee: Secondary | ICD-10-CM | POA: Insufficient documentation

## 2023-03-11 DIAGNOSIS — M25561 Pain in right knee: Secondary | ICD-10-CM | POA: Diagnosis not present

## 2023-03-11 NOTE — Progress Notes (Signed)
Office Visit Note   Patient: Morgan Swanson           Date of Birth: Apr 24, 1950           MRN: [id number] Visit Date: 03/11/2023              Requested by: Thyra Breed, MD [address], [address] [place],  [place] [address] PCP: Dot Been, FNP   Assessment & Plan: Visit Diagnoses:  1. Chronic pain of right knee   2. Unilateral primary osteoarthritis, right knee     Plan: We had a long discussion about knee replacement surgery.  I showed her knee replacement model and went over her x-rays.  We described in length in detail what the surgery involves and what to expect from an intraoperative and postoperative standpoint.  We discussed the risk and benefits of surgery.  She understands this will be quite challenging given her chronic pain medication needs and we will work on our end and work with Dr. Vear Clock on hopefully getting her pain under better control we can in order to get her knee bending and moving.  She understands that the best outcomes come from the patient getting her knee bending and moving and pushing himself hard through therapy.  All questions and concerns were answered and addressed.  Will get her scheduled hopefully in the near future for a right total knee replacement.  We can eventually address her left knee.  Follow-Up Instructions: Return for 2 weeks post-op.   Orders:  No orders of the defined types were placed in this encounter.  No orders of the defined types were placed in this encounter.     Procedures: No procedures performed   Clinical Data: No additional findings.   Subjective: Chief Complaint  Patient presents with   Right Knee - Pain  The patient is a 72 year old female who I am seeing for the first time.  She is mainly patient of Dr. Thyra Breed who has been diligent on working on her pain control as a relates to chronic pain.  She unfortunately has severe arthritis that is end-stage of her right knee.  This is also causing her to have  some pain in her left knee and likely she is developing worsening arthritis of her left knee.  She is on fentanyl patches and that does help some.  She walks with significant gait disturbance given the varus malalignment of her right knee.  At this point she has tried injections for a long period time and the only helped for a few weeks.  She is not a diabetic and not on blood thinning medication.  She is sent here to discuss knee replacement surgery.  At this point her knee pain is 10 out of 10 and is daily with the right knee.  It is detrimentally affecting her mobility, her quality of life, and her actives daily living.  HPI  Review of Systems There is listed no headache, chest pain, shortness of breath, fever, chills, nausea, vomiting  Objective: Vital Signs: There were no vitals taken for this visit.  Physical Exam She is alert and orient x 3 and in no acute distress Ortho Exam Examination of her right knee shows significant varus malalignment that is not correctable.  There is pain throughout the arc of motion of her knee with significant patellofemoral crepitation.  Her left knee also shows some varus malalignment but is not as severe as the right knee.  Her right knee has global  tenderness but is worse in the medial joint line. Specialty Comments:  No specialty comments available.  Imaging: No results found. X-rays of the right knee on the canopy system from 2023 already then showed severe arthritis of the right knee with varus malalignment, bone-on-bone wear the medial compartment and near bone-on-bone wear of the patellofemoral joint.  There are osteophytes in all 3 compartments.  PMFS History: Patient Active Problem List   Diagnosis Date Noted   Unilateral primary osteoarthritis, right knee 03/11/2023   Lumbar radiculopathy 03/15/2021   Chronic back pain 07/09/2017   Essential hypertension 09/19/2016   Heart palpitations 09/19/2016   Systolic murmur 09/19/2016   Chronic  bilateral low back pain with sciatica 09/10/2016   Lumbosacral root lesions 01/25/2014   Spinal stenosis, lumbar region, without neurogenic claudication 07/21/2012   Encounter for long-term (current) use of other medications 07/21/2012   Lumbosacral root lesions, not elsewhere classified 07/21/2012   Past Medical History:  Diagnosis Date   Arthritis    back    Back pain    Cataract    small immature   Depression    GERD (gastroesophageal reflux disease)    Hyperlipidemia    Hypertension    Spinal stenosis, lumbar region, without neurogenic claudication     Family History  Problem Relation Age of Onset   Breast cancer Mother    Diabetes Mother    Fibromyalgia Mother    Lung cancer Father    Stroke Brother    Colon cancer Neg Hx    Colon polyps Neg Hx    Esophageal cancer Neg Hx    Rectal cancer Neg Hx    Stomach cancer Neg Hx     Past Surgical History:  Procedure Laterality Date   30-Day Event Monitor  08/2016   15 events noted. Mostly sinus rhythm with occasional bradycardia. Low rate 50. Average rate 70 bpm. Rare PACs but no runs or couplets. No arrhythmia noted.   CHOLECYSTECTOMY     HAND SURGERY     fatty tissue removed    TRANSTHORACIC ECHOCARDIOGRAM  12/2001; 09/2016   a.Normal LV size and function. EF 55-65%. Normal regional wall motion. No valvular lesions;; b. October 04, 2016: Normal LV size with mild LV hypertrophy. EF 55-60%. Mildly increased RV size with normal systolic function. No significant valvular abnormalities   VAGINAL HYSTERECTOMY  1983   Zio Patch Monitor  08/2021   Mostly sinus rhythm, occasional (3.3%) isolated PACs.  Multiple (157) runs of PAT: Fastest- 5&6 beats, max 184 bpm; longest 16 beats (7.7 Sec) avg rate 128 bpm; symptoms mostly noted with sinus rhythm and PACs.  137 triggers most were not SVT/PAT episodes.   Social History   Occupational History   Occupation: Multimedia programmer: [employer]  Tobacco Use   Smoking status: Former     Current packs/day: 0.00    Types: Cigarettes    Quit date: 04/03/1999    Years since quitting: 23.9   Smokeless tobacco: Never  Substance and Sexual Activity   Alcohol use: Yes    Alcohol/week: 1.0 standard drink of alcohol    Types: 1 Glasses of wine per week    Comment: occ Holidays   Drug use: No   Sexual activity: Not on file

## 2023-03-15 ENCOUNTER — Other Ambulatory Visit: Payer: Self-pay | Admitting: Cardiology

## 2023-03-25 ENCOUNTER — Telehealth: Payer: Self-pay

## 2023-03-25 NOTE — Telephone Encounter (Signed)
I called patient and left voice mail for return call to discuss scheduling surgery.

## 2023-04-01 ENCOUNTER — Telehealth: Payer: Self-pay | Admitting: Orthopaedic Surgery

## 2023-04-01 NOTE — Telephone Encounter (Signed)
Patient requesting a call to scheduled right total knee with Dr. Magnus Ivan.  She is hoping for anything in January but will take first available.  Best number to reach patient is [phone].

## 2023-04-10 NOTE — Telephone Encounter (Signed)
I called patient and scheduled surgery. 

## 2023-04-22 ENCOUNTER — Other Ambulatory Visit: Payer: Self-pay

## 2023-05-01 NOTE — Progress Notes (Signed)
Surgical Instructions   Your procedure is scheduled on Tuesday, February 4, 25. Report to [facility] Main Entrance "A" at 9:45 A.M., then check in with the Admitting office. Any questions or running late day of surgery: call [phone]  Questions prior to your surgery date: call [phone], Monday-Friday, 8am-4pm. If you experience any cold or flu symptoms such as cough, fever, chills, shortness of breath, etc. between now and your scheduled surgery, please notify us at the above number.     Remember:  Do not eat after midnight the night before your surgery  You may drink clear liquids until 8:45 the morning of your surgery.   Clear liquids allowed are: Water, Non-Citrus Juices (without pulp), Carbonated Beverages, Clear Tea (no milk, honey, etc.), Black Coffee Only (NO MILK, CREAM OR POWDERED CREAMER of any kind), and Gatorade.    Take these medicines the morning of surgery with A SIP OF WATER  amLODipine (NORVASC)  busPIRone (BUSPAR)  CRESTOR gabapentin (NEURONTIN)  metoprolol tartrate (LOPRESSOR)  omeprazole (PRILOSEC)  sertraline (ZOLOFT)    One week prior to surgery, STOP taking any Aspirin (unless otherwise instructed by your surgeon) Aleve, Naproxen, Ibuprofen, Motrin, Advil, Goody's, BC's, all herbal medications, fish oil, and non-prescription vitamins. This includes your meloxicam (MOBIC).                     Do NOT Smoke (Tobacco/Vaping) for 24 hours prior to your procedure.  If you use a CPAP at night, you may bring your mask/headgear for your overnight stay.   You will be asked to remove any contacts, glasses, piercing's, hearing aid's, dentures/partials prior to surgery. Please bring cases for these items if needed.    Patients discharged the day of surgery will not be allowed to drive home, and someone needs to stay with them for 24 hours.  SURGICAL WAITING ROOM VISITATION Patients may have no more than 2 support people in the waiting area - these visitors  may rotate.   Pre-op nurse will coordinate an appropriate time for 1 ADULT support person, who may not rotate, to accompany patient in pre-op.  Children under the age of 52 must have an adult with them who is not the patient and must remain in the main waiting area with an adult.  If the patient needs to stay at the hospital during part of their recovery, the visitor guidelines for inpatient rooms apply.  Please refer to the [facility] website for the visitor guidelines for any additional information.   If you received a COVID test during your pre-op visit  it is requested that you wear a mask when out in public, stay away from anyone that may not be feeling well and notify your surgeon if you develop symptoms. If you have been in contact with anyone that has tested positive in the last 10 days please notify you surgeon.      Pre-operative 5 CHG Bathing Instructions   You can play a key role in reducing the risk of infection after surgery. Your skin needs to be as free of germs as possible. You can reduce the number of germs on your skin by washing with CHG (chlorhexidine gluconate) soap before surgery. CHG is an antiseptic soap that kills germs and continues to kill germs even after washing.   DO NOT use if you have an allergy to chlorhexidine/CHG or antibacterial soaps. If your skin becomes reddened or irritated, stop using the CHG and notify one of our RNs at [phone].  Please shower with the CHG soap starting 4 days before surgery using the following schedule:     Please keep in mind the following:  DO NOT shave, including legs and underarms, starting the day of your first shower.   You may shave your face at any point before/day of surgery.  Place clean sheets on your bed the day you start using CHG soap. Use a clean washcloth (not used since being washed) for each shower. DO NOT sleep with pets once you start using the CHG.   CHG Shower Instructions:  Wash your face and  private area with normal soap. If you choose to wash your hair, wash first with your normal shampoo.  After you use shampoo/soap, rinse your hair and body thoroughly to remove shampoo/soap residue.  Turn the water OFF and apply about 3 tablespoons (45 ml) of CHG soap to a CLEAN washcloth.  Apply CHG soap ONLY FROM YOUR NECK DOWN TO YOUR TOES (washing for 3-5 minutes)  DO NOT use CHG soap on face, private areas, open wounds, or sores.  Pay special attention to the area where your surgery is being performed.  If you are having back surgery, having someone wash your back for you may be helpful. Wait 2 minutes after CHG soap is applied, then you may rinse off the CHG soap.  Pat dry with a clean towel  Put on clean clothes/pajamas   If you choose to wear lotion, please use ONLY the CHG-compatible lotions that are listed below.  Additional instructions for the day of surgery: DO NOT APPLY any lotions, deodorants, cologne, or perfumes.   Do not bring valuables to the hospital. [facility] is not responsible for any belongings/valuables. Do not wear nail polish, gel polish, artificial nails, or any other type of covering on natural nails (fingers and toes) Do not wear jewelry or makeup Put on clean/comfortable clothes.  Please brush your teeth.  Ask your nurse before applying any prescription medications to the skin.     CHG Compatible Lotions   Aveeno Moisturizing lotion  Cetaphil Moisturizing Cream  Cetaphil Moisturizing Lotion  Clairol Herbal Essence Moisturizing Lotion, Dry Skin  Clairol Herbal Essence Moisturizing Lotion, Extra Dry Skin  Clairol Herbal Essence Moisturizing Lotion, Normal Skin  Curel Age Defying Therapeutic Moisturizing Lotion with Alpha Hydroxy  Curel Extreme Care Body Lotion  Curel Soothing Hands Moisturizing Hand Lotion  Curel Therapeutic Moisturizing Cream, Fragrance-Free  Curel Therapeutic Moisturizing Lotion, Fragrance-Free  Curel Therapeutic Moisturizing  Lotion, Original Formula  Eucerin Daily Replenishing Lotion  Eucerin Dry Skin Therapy Plus Alpha Hydroxy Crme  Eucerin Dry Skin Therapy Plus Alpha Hydroxy Lotion  Eucerin Original Crme  Eucerin Original Lotion  Eucerin Plus Crme Eucerin Plus Lotion  Eucerin TriLipid Replenishing Lotion  Keri Anti-Bacterial Hand Lotion  Keri Deep Conditioning Original Lotion Dry Skin Formula Softly Scented  Keri Deep Conditioning Original Lotion, Fragrance Free Sensitive Skin Formula  Keri Lotion Fast Absorbing Fragrance Free Sensitive Skin Formula  Keri Lotion Fast Absorbing Softly Scented Dry Skin Formula  Keri Original Lotion  Keri Skin Renewal Lotion Keri Silky Smooth Lotion  Keri Silky Smooth Sensitive Skin Lotion  Nivea Body Creamy Conditioning Oil  Nivea Body Extra Enriched Teacher, adult education Moisturizing Lotion Nivea Crme  Nivea Skin Firming Lotion  NutraDerm 30 Skin Lotion  NutraDerm Skin Lotion  NutraDerm Therapeutic Skin Cream  NutraDerm Therapeutic Skin Lotion  ProShield Protective Hand Cream  Provon moisturizing lotion  Please read  over the following fact sheets that you were given.

## 2023-05-02 ENCOUNTER — Encounter (HOSPITAL_COMMUNITY): Payer: Self-pay

## 2023-05-02 ENCOUNTER — Encounter (HOSPITAL_COMMUNITY)
Admission: RE | Admit: 2023-05-02 | Discharge: 2023-05-02 | Disposition: A | Discharge status: Home / Self Care | Payer: 59 | Source: Ambulatory Visit | Attending: Orthopaedic Surgery | Admitting: Orthopaedic Surgery

## 2023-05-02 ENCOUNTER — Other Ambulatory Visit: Payer: Self-pay

## 2023-05-02 VITALS — BP 154/80 | HR 82 | Temp 98.3°F | Resp 17 | Ht 65.0 in | Wt 144.9 lb

## 2023-05-02 DIAGNOSIS — R002 Palpitations: Secondary | ICD-10-CM | POA: Diagnosis not present

## 2023-05-02 DIAGNOSIS — M1711 Unilateral primary osteoarthritis, right knee: Secondary | ICD-10-CM | POA: Diagnosis not present

## 2023-05-02 DIAGNOSIS — I10 Essential (primary) hypertension: Secondary | ICD-10-CM | POA: Insufficient documentation

## 2023-05-02 DIAGNOSIS — Z01818 Encounter for other preprocedural examination: Secondary | ICD-10-CM | POA: Insufficient documentation

## 2023-05-02 DIAGNOSIS — Z01812 Encounter for preprocedural laboratory examination: Secondary | ICD-10-CM | POA: Diagnosis present

## 2023-05-02 DIAGNOSIS — F418 Other specified anxiety disorders: Secondary | ICD-10-CM | POA: Diagnosis not present

## 2023-05-02 DIAGNOSIS — Z01810 Encounter for preprocedural cardiovascular examination: Secondary | ICD-10-CM | POA: Diagnosis present

## 2023-05-02 DIAGNOSIS — K219 Gastro-esophageal reflux disease without esophagitis: Secondary | ICD-10-CM | POA: Insufficient documentation

## 2023-05-02 DIAGNOSIS — G8929 Other chronic pain: Secondary | ICD-10-CM | POA: Insufficient documentation

## 2023-05-02 HISTORY — DX: Anxiety disorder, unspecified: F41.9

## 2023-05-02 LAB — SURGICAL PCR SCREEN

## 2023-05-02 LAB — BASIC METABOLIC PANEL
Anion gap: 14 (ref 5–15)
BUN: 16 mg/dL (ref 8–23)
CO2: 24 mmol/L (ref 22–32)
Calcium: 10.2 mg/dL (ref 8.9–10.3)
Chloride: 101 mmol/L (ref 98–111)
Creatinine, Ser: 1.08 mg/dL — ABNORMAL HIGH (ref 0.44–1.00)
GFR, Estimated: 54 mL/min — ABNORMAL LOW (ref >60)
Glucose, Bld: 94 mg/dL (ref 70–99)
Potassium: 3.7 mmol/L (ref 3.5–5.1)
Sodium: 139 mmol/L (ref 135–145)

## 2023-05-02 LAB — TYPE AND SCREEN
ABO/RH(D): A POS
Antibody Screen: NEGATIVE

## 2023-05-02 LAB — CBC
HCT: 40.5 % (ref 36.0–46.0)
Hemoglobin: 13.3 g/dL (ref 12.0–15.0)
MCH: 29.6 pg (ref 26.0–34.0)
MCHC: 32.8 g/dL (ref 30.0–36.0)
MCV: 90.2 fL (ref 80.0–100.0)
Platelets: 313 10*3/uL (ref 150–400)
RBC: 4.49 MIL/uL (ref 3.87–5.11)
RDW: 13.1 % (ref 11.5–15.5)
WBC: 5 10*3/uL (ref 4.0–10.5)
nRBC: 0 % (ref 0.0–0.2)

## 2023-05-02 NOTE — Progress Notes (Signed)
Surgical Instructions   Your procedure is scheduled on Tuesday, May 07, 2023. Report to [facility] Main Entrance "A" at 9:45 A.M., then check in with the Admitting office. Any questions or running late day of surgery: call [phone]  Questions prior to your surgery date: call [phone], Monday-Friday, 8am-4pm. If you experience any cold or flu symptoms such as cough, fever, chills, shortness of breath, etc. between now and your scheduled surgery, please notify us at the above number.     Remember:  Do not eat after midnight the night before your surgery   You may drink clear liquids until 8:45 the morning of your surgery.   Clear liquids allowed are: Water, Non-Citrus Juices (without pulp), Carbonated Beverages, Clear Tea (no milk, honey, etc.), Black Coffee Only (NO MILK, CREAM OR POWDERED CREAMER of any kind), and Gatorade.    Take these medicines the morning of surgery with A SIP OF WATER  amLODipine (NORVASC)  busPIRone (BUSPAR)  CRESTOR gabapentin (NEURONTIN)  metoprolol tartrate (LOPRESSOR)  omeprazole (PRILOSEC)  sertraline (ZOLOFT)     One week prior to surgery, STOP taking any Aspirin (unless otherwise instructed by your surgeon) Aleve, Naproxen, Ibuprofen, Motrin, Advil, Goody's, BC's, all herbal medications, fish oil, and non-prescription vitamins. This includes your meloxicam (MOBIC).                      Do NOT Smoke (Tobacco/Vaping) for 24 hours prior to your procedure.  If you use a CPAP at night, you may bring your mask/headgear for your overnight stay.   You will be asked to remove any contacts, glasses, piercing's, hearing aid's, dentures/partials prior to surgery. Please bring cases for these items if needed.    Patients discharged the day of surgery will not be allowed to drive home, and someone needs to stay with them for 24 hours.  SURGICAL WAITING ROOM VISITATION Patients may have no more than 2 support people in the waiting area - these  visitors may rotate.   Pre-op nurse will coordinate an appropriate time for 1 ADULT support person, who may not rotate, to accompany patient in pre-op.  Children under the age of 53 must have an adult with them who is not the patient and must remain in the main waiting area with an adult.  If the patient needs to stay at the hospital during part of their recovery, the visitor guidelines for inpatient rooms apply.  Please refer to the [facility] website for the visitor guidelines for any additional information.   If you received a COVID test during your pre-op visit  it is requested that you wear a mask when out in public, stay away from anyone that may not be feeling well and notify your surgeon if you develop symptoms. If you have been in contact with anyone that has tested positive in the last 10 days please notify you surgeon.      Pre-operative CHG Bathing Instructions   You can play a key role in reducing the risk of infection after surgery. Your skin needs to be as free of germs as possible. You can reduce the number of germs on your skin by washing with CHG (chlorhexidine gluconate) soap before surgery. CHG is an antiseptic soap that kills germs and continues to kill germs even after washing.   DO NOT use if you have an allergy to chlorhexidine/CHG or antibacterial soaps. If your skin becomes reddened or irritated, stop using the CHG and notify one of our RNs  at [phone].              TAKE A SHOWER THE NIGHT BEFORE SURGERY AND THE DAY OF SURGERY    Please keep in mind the following:  DO NOT shave, including legs and underarms, 48 hours prior to surgery.   You may shave your face before/day of surgery.  Place clean sheets on your bed the night before surgery Use a clean washcloth (not used since being washed) for each shower. DO NOT sleep with pet's night before surgery.  CHG Shower Instructions:  Wash your face and private area with normal soap. If you choose to wash your  hair, wash first with your normal shampoo.  After you use shampoo/soap, rinse your hair and body thoroughly to remove shampoo/soap residue.  Turn the water OFF and apply half the bottle of CHG soap to a CLEAN washcloth.  Apply CHG soap ONLY FROM YOUR NECK DOWN TO YOUR TOES (washing for 3-5 minutes)  DO NOT use CHG soap on face, private areas, open wounds, or sores.  Pay special attention to the area where your surgery is being performed.  If you are having back surgery, having someone wash your back for you may be helpful. Wait 2 minutes after CHG soap is applied, then you may rinse off the CHG soap.  Pat dry with a clean towel  Put on clean pajamas    Additional instructions for the day of surgery: DO NOT APPLY any lotions, deodorants, cologne, or perfumes.   Do not wear jewelry or makeup Do not wear nail polish, gel polish, artificial nails, or any other type of covering on natural nails (fingers and toes) Do not bring valuables to the hospital. [facility] is not responsible for valuables/personal belongings. Put on clean/comfortable clothes.  Please brush your teeth.  Ask your nurse before applying any prescription medications to the skin.

## 2023-05-02 NOTE — Progress Notes (Signed)
PCP - Dr. Sherron Flemings Cardiologist - Dr. Bryan Lemma  PPM/ICD - Denies Device Orders - n/a Rep Notified - n/a  Chest x-ray - n/a EKG - 05-02-23 Stress Test - Denies ECHO - 10-04-16 Cardiac Cath - Denies  Sleep Study - Denies CPAP - n/a  NON-diabetic  Last dose of GLP1 agonist-  Denies GLP1 instructions: n/a  Blood Thinner Instructions: Denies Aspirin Instructions: Denies  ERAS Protcol - ERAS till 8:45 PRE-SURGERY Ensure or G2- Ensure   COVID TEST- No   Anesthesia review: Yes, hypertension. Heart murmur as child but has never been told about it by doctor. New EKG.   Patient denies shortness of breath, fever, cough and chest pain at PAT appointment. Patient states no respiratory issues at this time.    All instructions explained to the patient, with a verbal understanding of the material. Patient agrees to go over the instructions while at home for a better understanding. Patient also instructed to self quarantine after being tested for COVID-19. The opportunity to ask questions was provided.

## 2023-05-03 NOTE — Anesthesia Preprocedure Evaluation (Signed)
Anesthesia Evaluation  Patient identified by MRN, date of birth, ID band Patient awake    Reviewed: Allergy & Precautions, NPO status , Patient's Chart, lab work & pertinent test results, reviewed documented beta blocker date and time   Airway Mallampati: II  TM Distance: >3 FB Neck ROM: Full    Dental  (+) Missing, Chipped, Dental Advisory Given,    Pulmonary former smoker   Pulmonary exam normal breath sounds clear to auscultation       Cardiovascular hypertension, Pt. on medications and Pt. on home beta blockers Normal cardiovascular exam Rhythm:Regular Rate:Normal   evaluated by cardiology in 2023 for hypertension and palpitations.  She wore a heart monitor that showed numerous PACs and short runs of PAT but no sustained arrhythmia.  She was started on metoprolol with improvement.  She had a prior echo in 09/2016 that showed EF of 55 to 60%, grade 1 DD, no significant valvular abnormalities.   Neuro/Psych  PSYCHIATRIC DISORDERS Anxiety Depression    negative neurological ROS     GI/Hepatic Neg liver ROS,GERD  Medicated and Controlled,,  Endo/Other  negative endocrine ROS    Renal/GU Renal InsufficiencyRenal diseaseCr 1.08  negative genitourinary   Musculoskeletal  (+) Arthritis , Osteoarthritis,    Abdominal   Peds  Hematology negative hematology ROS (+) Hb 13.3, plt 313   Anesthesia Other Findings   Reproductive/Obstetrics negative OB ROS                             Anesthesia Physical Anesthesia Plan  ASA: 2  Anesthesia Plan: Spinal, Regional and MAC   Post-op Pain Management: Regional block* and Tylenol PO (pre-op)*   Induction:   PONV Risk Score and Plan: 2 and Propofol infusion and TIVA  Airway Management Planned: Natural Airway and Nasal Cannula  Additional Equipment: None  Intra-op Plan:   Post-operative Plan:   Informed Consent: I have reviewed the patients History  and Physical, chart, labs and discussed the procedure including the risks, benefits and alternatives for the proposed anesthesia with the patient or authorized representative who has indicated his/her understanding and acceptance.       Plan Discussed with: CRNA  Anesthesia Plan Comments: ( )        Anesthesia Quick Evaluation

## 2023-05-03 NOTE — Progress Notes (Signed)
Anesthesia Chart Review:  73 year old female with pertinent history including GERD, HTN, palpitations, anxiety/depression, chronic pain.  Patient was evaluated by cardiology in 2023 for hypertension and palpitations.  She wore a heart monitor that showed numerous PACs and short runs of PAT but no sustained arrhythmia.  She was started on metoprolol with improvement.  She had a prior echo in 09/2016 that showed EF of 55 to 60%, grade 1 DD, no significant valvular abnormalities.  Patient reports she is able to go up 2 flights of stairs without chest pain or significant shortness of breath.  Preop labs reviewed, creatinine mildly elevated 1.08, otherwise WNL.  EKG 05/02/2023: NSR.  Possible LAE.  Rate 78.  Minimal voltage criteria for LVH, may be normal variant.  TTE 10/04/2016: - Left ventricle: The cavity size was normal. Wall thickness was    increased in a pattern of mild LVH. Systolic function was normal.    The estimated ejection fraction was in the range of 55% to 60%.    Wall motion was normal; there were no regional wall motion    abnormalities. Doppler parameters are consistent with abnormal    left ventricular relaxation (grade 1 diastolic dysfunction).  - Aortic valve: There was no stenosis.  - Mitral valve: There was trivial regurgitation.  - Right ventricle: The cavity size was mildly dilated. Systolic    function was normal.  - Tricuspid valve: Peak RV-RA gradient (S): 31 mm Hg.  - Pulmonary arteries: PA peak pressure: 34 mm Hg (S).  - Inferior vena cava: The vessel was normal in size. The    respirophasic diameter changes were in the normal range (= 50%),    consistent with normal central venous pressure.   Impressions:   - Normal LV size with mild LV hypertrophy. EF 55-60%. Mildly    increased RV size with normal systolic function. No significant    valvular abnormalities.     Zannie C[facility]/Anesthesiology Phone [phone] 05/03/2023  11:43 AM

## 2023-05-06 NOTE — H&P (Signed)
TOTAL KNEE ADMISSION H&P  Patient is being admitted for right total knee arthroplasty.  Subjective:  Chief Complaint:right knee pain.  HPI: Morgan Swanson, 73 y.o. female, has a history of pain and functional disability in the right knee due to arthritis and has failed non-surgical conservative treatments for greater than 12 weeks to includeNSAID's and/or analgesics, corticosteriod injections, flexibility and strengthening excercises, use of assistive devices, and activity modification.  Onset of symptoms was gradual, starting 4 years ago with gradually worsening course since that time. The patient noted no past surgery on the right knee(s).  Patient currently rates pain in the right knee(s) at 10 out of 10 with activity. Patient has night pain, worsening of pain with activity and weight bearing, pain that interferes with activities of daily living, pain with passive range of motion, crepitus, and joint swelling.  Patient has evidence of subchondral sclerosis, periarticular osteophytes, and joint space narrowing by imaging studies. There is no active infection.  Patient Active Problem List   Diagnosis Date Noted   Unilateral primary osteoarthritis, right knee 03/11/2023   Lumbar radiculopathy 03/15/2021   Chronic back pain 07/09/2017   Essential hypertension 09/19/2016   Heart palpitations 09/19/2016   Systolic murmur 09/19/2016   Chronic bilateral low back pain with sciatica 09/10/2016   Lumbosacral root lesions 01/25/2014   Spinal stenosis, lumbar region, without neurogenic claudication 07/21/2012   Encounter for long-term (current) use of other medications 07/21/2012   Lumbosacral root lesions, not elsewhere classified 07/21/2012   Past Medical History:  Diagnosis Date   Anxiety    Arthritis    back    Back pain    Cataract    small immature   Depression    GERD (gastroesophageal reflux disease)    Hyperlipidemia    Hypertension    Spinal stenosis, lumbar region, without  neurogenic claudication     Past Surgical History:  Procedure Laterality Date   30-Day Event Monitor  08/2016   15 events noted. Mostly sinus rhythm with occasional bradycardia. Low rate 50. Average rate 70 bpm. Rare PACs but no runs or couplets. No arrhythmia noted.   CHOLECYSTECTOMY     HAND SURGERY     fatty tissue removed    TRANSTHORACIC ECHOCARDIOGRAM  12/2001; 09/2016   a.Normal LV size and function. EF 55-65%. Normal regional wall motion. No valvular lesions;; b. October 04, 2016: Normal LV size with mild LV hypertrophy. EF 55-60%. Mildly increased RV size with normal systolic function. No significant valvular abnormalities   VAGINAL HYSTERECTOMY  1983   Zio Patch Monitor  08/2021   Mostly sinus rhythm, occasional (3.3%) isolated PACs.  Multiple (157) runs of PAT: Fastest- 5&6 beats, max 184 bpm; longest 16 beats (7.7 Sec) avg rate 128 bpm; symptoms mostly noted with sinus rhythm and PACs.  137 triggers most were not SVT/PAT episodes.    No current facility-administered medications for this encounter.   Current Outpatient Medications  Medication Sig Dispense Refill Last Dose/Taking   amLODipine (NORVASC) 10 MG tablet Take 1 tablet by mouth daily.   Taking   Aspirin-Caffeine (BC FAST PAIN RELIEF PO) Take 1 packet by mouth daily as needed (pain).   Taking As Needed   baclofen (LIORESAL) 10 MG tablet Take 1 tablet (10 mg total) by mouth at bedtime as needed for muscle spasms. (Patient taking differently: Take 10 mg by mouth at bedtime.) 30 each 3 Taking Differently   busPIRone (BUSPAR) 15 MG tablet Take 15 mg by mouth 2 (two) times  daily.   Taking   CRESTOR 20 MG tablet Take 1 tablet by mouth daily.   Taking   fentaNYL (DURAGESIC) 12 MCG/HR Place 1 patch onto the skin every 3 (three) days. Patient has drug agreement, 10 patch 0 Taking   gabapentin (NEURONTIN) 100 MG capsule Take 1 capsule (100 mg total) by mouth 3 (three) times daily. 270 capsule 4 Taking   metoprolol tartrate (LOPRESSOR)  25 MG tablet TAKE 1 TABLET(25 MG) BY MOUTH TWICE DAILY 180 tablet 0 Taking   omeprazole (PRILOSEC) 40 MG capsule Take 1 capsule by mouth daily.   Taking   sertraline (ZOLOFT) 25 MG tablet Take 25 mg by mouth daily.   Taking   triamterene-hydrochlorothiazide (MAXZIDE-25) 37.5-25 MG tablet Take 0.5 tablets by mouth daily.  0 Taking   Allergies  Allergen Reactions   Lisinopril Cough   Penicillins     Unknown childhood allergy   Codeine Palpitations    Feels funny, drunk    Social History   Tobacco Use   Smoking status: Former    Current packs/day: 0.00    Types: Cigarettes    Quit date: 04/03/1999    Years since quitting: 24.1   Smokeless tobacco: Never  Substance Use Topics   Alcohol use: Yes    Alcohol/week: 1.0 standard drink of alcohol    Types: 1 Glasses of wine per week    Comment: occ Holidays    Family History  Problem Relation Age of Onset   Breast cancer Mother    Diabetes Mother    Fibromyalgia Mother    Lung cancer Father    Stroke Brother    Colon cancer Neg Hx    Colon polyps Neg Hx    Esophageal cancer Neg Hx    Rectal cancer Neg Hx    Stomach cancer Neg Hx      Review of Systems  Objective:  Physical Exam Vitals reviewed.  Constitutional:      Appearance: Normal appearance. She is normal weight.  HENT:     Head: Normocephalic and atraumatic.  Eyes:     Extraocular Movements: Extraocular movements intact.     Pupils: Pupils are equal, round, and reactive to light.  Cardiovascular:     Rate and Rhythm: Normal rate and regular rhythm.  Pulmonary:     Effort: Pulmonary effort is normal.     Breath sounds: Normal breath sounds.  Abdominal:     Palpations: Abdomen is soft.  Musculoskeletal:     Cervical back: Normal range of motion and neck supple.     Right knee: Effusion, bony tenderness and crepitus present. Decreased range of motion. Tenderness present over the medial joint line and lateral joint line. Abnormal alignment and abnormal meniscus.   Neurological:     Mental Status: She is alert and oriented to person, place, and time.  Psychiatric:        Behavior: Behavior normal.     Vital signs in last 24 hours:    Labs:   Estimated body mass index is 24.11 kg/m as calculated from the following:   Height as of 05/02/23: 5\' 5"  (1.651 m).   Weight as of 05/02/23: 65.7 kg.   Imaging Review Plain radiographs demonstrate severe degenerative joint disease of the right knee(s). The overall alignment ismild varus. The bone quality appears to be good for age and reported activity level.      Assessment/Plan:  End stage arthritis, right knee   The patient history, physical examination, clinical judgment of  the provider and imaging studies are consistent with end stage degenerative joint disease of the right knee(s) and total knee arthroplasty is deemed medically necessary. The treatment options including medical management, injection therapy arthroscopy and arthroplasty were discussed at length. The risks and benefits of total knee arthroplasty were presented and reviewed. The risks due to aseptic loosening, infection, stiffness, patella tracking problems, thromboembolic complications and other imponderables were discussed. The patient acknowledged the explanation, agreed to proceed with the plan and consent was signed. Patient is being admitted for inpatient treatment for surgery, pain control, PT, OT, prophylactic antibiotics, VTE prophylaxis, progressive ambulation and ADL's and discharge planning. The patient is planning to be discharged home with home health services

## 2023-05-07 ENCOUNTER — Encounter (HOSPITAL_COMMUNITY): Payer: Self-pay | Admitting: Orthopaedic Surgery

## 2023-05-07 ENCOUNTER — Encounter (HOSPITAL_COMMUNITY)
Admission: RE | Disposition: A | Discharge status: Home Health | Payer: Self-pay | Source: Home / Self Care | Attending: Orthopaedic Surgery

## 2023-05-07 ENCOUNTER — Observation Stay (HOSPITAL_COMMUNITY): Payer: 59

## 2023-05-07 ENCOUNTER — Ambulatory Visit (HOSPITAL_COMMUNITY): Payer: 59 | Admitting: Anesthesiology

## 2023-05-07 ENCOUNTER — Ambulatory Visit (HOSPITAL_COMMUNITY): Payer: Self-pay | Admitting: Physician Assistant

## 2023-05-07 ENCOUNTER — Other Ambulatory Visit: Payer: Self-pay

## 2023-05-07 ENCOUNTER — Observation Stay (HOSPITAL_COMMUNITY)
Admission: RE | Admit: 2023-05-07 | Discharge: 2023-05-08 | Disposition: A | Discharge status: Home Health | Payer: 59 | Attending: Orthopaedic Surgery | Admitting: Orthopaedic Surgery

## 2023-05-07 DIAGNOSIS — Z79899 Other long term (current) drug therapy: Secondary | ICD-10-CM | POA: Diagnosis not present

## 2023-05-07 DIAGNOSIS — M1711 Unilateral primary osteoarthritis, right knee: Secondary | ICD-10-CM | POA: Diagnosis present

## 2023-05-07 DIAGNOSIS — Z01818 Encounter for other preprocedural examination: Secondary | ICD-10-CM

## 2023-05-07 DIAGNOSIS — Z96651 Presence of right artificial knee joint: Secondary | ICD-10-CM

## 2023-05-07 DIAGNOSIS — Z7982 Long term (current) use of aspirin: Secondary | ICD-10-CM | POA: Diagnosis not present

## 2023-05-07 DIAGNOSIS — I10 Essential (primary) hypertension: Secondary | ICD-10-CM | POA: Diagnosis not present

## 2023-05-07 DIAGNOSIS — Z87891 Personal history of nicotine dependence: Secondary | ICD-10-CM | POA: Insufficient documentation

## 2023-05-07 HISTORY — PX: TOTAL KNEE ARTHROPLASTY: SHX125

## 2023-05-07 LAB — SURGICAL PCR SCREEN
MRSA, PCR: NEGATIVE
Staphylococcus aureus: NEGATIVE

## 2023-05-07 LAB — ABO/RH: ABO/RH(D): A POS

## 2023-05-07 SURGERY — ARTHROPLASTY, KNEE, TOTAL
Anesthesia: Monitor Anesthesia Care | Site: Knee | Laterality: Right

## 2023-05-07 MED ORDER — MENTHOL 3 MG MT LOZG: 1.0000 | LOZENGE | OROMUCOSAL | Status: DC | PRN

## 2023-05-07 MED ORDER — ONDANSETRON HCL 4 MG PO TABS
4.0000 mg | ORAL_TABLET | Freq: Four times a day (QID) | ORAL | Status: DC | PRN
Start: 2023-05-07 — End: 2023-05-08

## 2023-05-07 MED ORDER — ONDANSETRON HCL 4 MG/2ML IJ SOLN
INTRAMUSCULAR | Status: DC | PRN
  Administered 2023-05-07: 4 mg via INTRAVENOUS

## 2023-05-07 MED ORDER — KETOROLAC TROMETHAMINE 15 MG/ML IJ SOLN
7.5000 mg | Freq: Four times a day (QID) | INTRAMUSCULAR | Status: AC
  Administered 2023-05-07 – 2023-05-08 (×3): 7.5 mg via INTRAVENOUS
  Filled 2023-05-07 (×3): qty 1

## 2023-05-07 MED ORDER — CEFAZOLIN SODIUM-DEXTROSE 2-4 GM/100ML-% IV SOLN
2.0000 g | Freq: Four times a day (QID) | INTRAVENOUS | Status: AC
  Administered 2023-05-07 – 2023-05-08 (×2): 2 g via INTRAVENOUS
  Filled 2023-05-07 (×2): qty 100

## 2023-05-07 MED ORDER — GABAPENTIN 100 MG PO CAPS
100.0000 mg | ORAL_CAPSULE | Freq: Three times a day (TID) | ORAL | Status: DC
Start: 2023-05-07 — End: 2023-05-08
  Administered 2023-05-07 – 2023-05-08 (×2): 100 mg via ORAL
  Filled 2023-05-07 (×2): qty 1

## 2023-05-07 MED ORDER — BUPIVACAINE-EPINEPHRINE (PF) 0.25% -1:200000 IJ SOLN
INTRAMUSCULAR | Status: AC
  Filled 2023-05-07: qty 30

## 2023-05-07 MED ORDER — PROPOFOL 500 MG/50ML IV EMUL
INTRAVENOUS | Status: DC | PRN
  Administered 2023-05-07: 80 ug/kg/min via INTRAVENOUS

## 2023-05-07 MED ORDER — ONDANSETRON HCL 4 MG/2ML IJ SOLN
4.0000 mg | Freq: Four times a day (QID) | INTRAMUSCULAR | Status: DC | PRN
Start: 2023-05-07 — End: 2023-05-08

## 2023-05-07 MED ORDER — TRIAMTERENE-HCTZ 37.5-25 MG PO TABS
0.5000 | ORAL_TABLET | Freq: Every day | ORAL | Status: DC
  Administered 2023-05-08: 0.5 via ORAL
  Filled 2023-05-07 (×2): qty 0.5

## 2023-05-07 MED ORDER — ACETAMINOPHEN 325 MG PO TABS
325.0000 mg | ORAL_TABLET | Freq: Four times a day (QID) | ORAL | Status: DC | PRN
Start: 2023-05-08 — End: 2023-05-08
  Administered 2023-05-08: 650 mg via ORAL
  Filled 2023-05-07: qty 2

## 2023-05-07 MED ORDER — DOCUSATE SODIUM 100 MG PO CAPS
100.0000 mg | ORAL_CAPSULE | Freq: Two times a day (BID) | ORAL | Status: DC
  Administered 2023-05-07 – 2023-05-08 (×2): 100 mg via ORAL
  Filled 2023-05-07 (×2): qty 1

## 2023-05-07 MED ORDER — OXYCODONE HCL 5 MG PO TABS: 5.0000 mg | ORAL_TABLET | Freq: Once | ORAL | Status: DC | PRN

## 2023-05-07 MED ORDER — FENTANYL CITRATE (PF) 100 MCG/2ML IJ SOLN: 50.0000 ug | Freq: Once | INTRAMUSCULAR | Status: AC

## 2023-05-07 MED ORDER — SERTRALINE HCL 25 MG PO TABS
25.0000 mg | ORAL_TABLET | Freq: Every day | ORAL | Status: DC
Start: 2023-05-08 — End: 2023-05-08
  Administered 2023-05-08: 25 mg via ORAL
  Filled 2023-05-07: qty 1

## 2023-05-07 MED ORDER — HYDROMORPHONE HCL 1 MG/ML IJ SOLN: 0.2500 mg | INTRAMUSCULAR | Status: DC | PRN

## 2023-05-07 MED ORDER — KETOROLAC TROMETHAMINE 30 MG/ML IJ SOLN
INTRAMUSCULAR | Status: AC
  Filled 2023-05-07: qty 1

## 2023-05-07 MED ORDER — PHENOL 1.4 % MT LIQD: 1.0000 | OROMUCOSAL | Status: DC | PRN

## 2023-05-07 MED ORDER — ALUM & MAG HYDROXIDE-SIMETH 200-200-20 MG/5ML PO SUSP
30.0000 mL | ORAL | Status: DC | PRN
Start: 2023-05-07 — End: 2023-05-08

## 2023-05-07 MED ORDER — CHLORHEXIDINE GLUCONATE 0.12 % MT SOLN
15.0000 mL | Freq: Once | OROMUCOSAL | Status: AC
  Administered 2023-05-07: 15 mL via OROMUCOSAL

## 2023-05-07 MED ORDER — HYDROMORPHONE HCL 2 MG PO TABS: 2.0000 mg | ORAL_TABLET | ORAL | Status: DC | PRN

## 2023-05-07 MED ORDER — SODIUM CHLORIDE 0.9 % IR SOLN
Status: DC | PRN
  Administered 2023-05-07: 1000 mL

## 2023-05-07 MED ORDER — AMLODIPINE BESYLATE 10 MG PO TABS
10.0000 mg | ORAL_TABLET | Freq: Every day | ORAL | Status: DC
  Administered 2023-05-08: 10 mg via ORAL
  Filled 2023-05-07: qty 1

## 2023-05-07 MED ORDER — METOCLOPRAMIDE HCL 5 MG PO TABS: 5.0000 mg | ORAL_TABLET | Freq: Three times a day (TID) | ORAL | Status: DC | PRN

## 2023-05-07 MED ORDER — TIZANIDINE HCL 4 MG PO TABS: 4.0000 mg | ORAL_TABLET | Freq: Four times a day (QID) | ORAL | Status: DC | PRN

## 2023-05-07 MED ORDER — ONDANSETRON HCL 4 MG/2ML IJ SOLN
INTRAMUSCULAR | Status: AC
  Filled 2023-05-07: qty 2

## 2023-05-07 MED ORDER — OXYCODONE HCL 5 MG/5ML PO SOLN: 5.0000 mg | Freq: Once | ORAL | Status: DC | PRN

## 2023-05-07 MED ORDER — DEXAMETHASONE SODIUM PHOSPHATE 10 MG/ML IJ SOLN
INTRAMUSCULAR | Status: AC
  Filled 2023-05-07: qty 1

## 2023-05-07 MED ORDER — PROPOFOL 10 MG/ML IV BOLUS
INTRAVENOUS | Status: AC
  Filled 2023-05-07: qty 20

## 2023-05-07 MED ORDER — LIDOCAINE 2% (20 MG/ML) 5 ML SYRINGE
INTRAMUSCULAR | Status: AC
  Filled 2023-05-07: qty 5

## 2023-05-07 MED ORDER — HYDROMORPHONE HCL 1 MG/ML IJ SOLN: 0.5000 mg | INTRAMUSCULAR | Status: DC | PRN

## 2023-05-07 MED ORDER — TRANEXAMIC ACID-NACL 1000-0.7 MG/100ML-% IV SOLN
1000.0000 mg | INTRAVENOUS | Status: AC
  Administered 2023-05-07: 1000 mg via INTRAVENOUS
  Filled 2023-05-07: qty 100

## 2023-05-07 MED ORDER — PROPOFOL 10 MG/ML IV BOLUS
INTRAVENOUS | Status: DC | PRN
  Administered 2023-05-07: 40 mg via INTRAVENOUS
  Administered 2023-05-07: 50 mg via INTRAVENOUS

## 2023-05-07 MED ORDER — METOPROLOL TARTRATE 25 MG PO TABS
25.0000 mg | ORAL_TABLET | Freq: Two times a day (BID) | ORAL | Status: DC
  Administered 2023-05-07: 25 mg via ORAL
  Filled 2023-05-07 (×2): qty 1

## 2023-05-07 MED ORDER — CEFAZOLIN SODIUM-DEXTROSE 2-4 GM/100ML-% IV SOLN
2.0000 g | INTRAVENOUS | Status: AC
  Administered 2023-05-07: 2 g via INTRAVENOUS
  Filled 2023-05-07: qty 100

## 2023-05-07 MED ORDER — DIPHENHYDRAMINE HCL 12.5 MG/5ML PO ELIX: 12.5000 mg | ORAL_SOLUTION | ORAL | Status: DC | PRN

## 2023-05-07 MED ORDER — LACTATED RINGERS IV SOLN
INTRAVENOUS | Status: DC
Start: 2023-05-07 — End: 2023-05-07

## 2023-05-07 MED ORDER — DEXAMETHASONE SODIUM PHOSPHATE 10 MG/ML IJ SOLN
INTRAMUSCULAR | Status: DC | PRN
  Administered 2023-05-07: 10 mg via INTRAVENOUS

## 2023-05-07 MED ORDER — ROSUVASTATIN CALCIUM 20 MG PO TABS
20.0000 mg | ORAL_TABLET | Freq: Every day | ORAL | Status: DC
  Administered 2023-05-08: 20 mg via ORAL
  Filled 2023-05-07: qty 1

## 2023-05-07 MED ORDER — PANTOPRAZOLE SODIUM 40 MG PO TBEC
40.0000 mg | DELAYED_RELEASE_TABLET | Freq: Every day | ORAL | Status: DC
  Administered 2023-05-07 – 2023-05-08 (×2): 40 mg via ORAL
  Filled 2023-05-07 (×2): qty 1

## 2023-05-07 MED ORDER — ZOLPIDEM TARTRATE 5 MG PO TABS: 5.0000 mg | ORAL_TABLET | Freq: Every evening | ORAL | Status: DC | PRN

## 2023-05-07 MED ORDER — 0.9 % SODIUM CHLORIDE (POUR BTL) OPTIME
TOPICAL | Status: DC | PRN
  Administered 2023-05-07: 1000 mL

## 2023-05-07 MED ORDER — PHENYLEPHRINE HCL-NACL 20-0.9 MG/250ML-% IV SOLN
INTRAVENOUS | Status: DC | PRN
  Administered 2023-05-07: 30 ug/min via INTRAVENOUS

## 2023-05-07 MED ORDER — BUSPIRONE HCL 5 MG PO TABS
15.0000 mg | ORAL_TABLET | Freq: Two times a day (BID) | ORAL | Status: DC
  Administered 2023-05-07 – 2023-05-08 (×2): 15 mg via ORAL
  Filled 2023-05-07 (×2): qty 1

## 2023-05-07 MED ORDER — FENTANYL CITRATE (PF) 100 MCG/2ML IJ SOLN
INTRAMUSCULAR | Status: AC
  Administered 2023-05-07: 50 ug via INTRAVENOUS
  Filled 2023-05-07: qty 2

## 2023-05-07 MED ORDER — ONDANSETRON HCL 4 MG/2ML IJ SOLN: 4.0000 mg | Freq: Once | INTRAMUSCULAR | Status: DC | PRN

## 2023-05-07 MED ORDER — MIDAZOLAM HCL 2 MG/2ML IJ SOLN: 1.0000 mg | Freq: Once | INTRAMUSCULAR | Status: AC

## 2023-05-07 MED ORDER — EPHEDRINE SULFATE-NACL 50-0.9 MG/10ML-% IV SOSY
PREFILLED_SYRINGE | INTRAVENOUS | Status: DC | PRN
  Administered 2023-05-07: 5 mg via INTRAVENOUS
  Administered 2023-05-07: 10 mg via INTRAVENOUS

## 2023-05-07 MED ORDER — AMISULPRIDE (ANTIEMETIC) 5 MG/2ML IV SOLN: 10.0000 mg | Freq: Once | INTRAVENOUS | Status: DC | PRN

## 2023-05-07 MED ORDER — OXYCODONE HCL 5 MG PO TABS
5.0000 mg | ORAL_TABLET | ORAL | Status: DC | PRN
  Administered 2023-05-08: 10 mg via ORAL
  Filled 2023-05-07: qty 2

## 2023-05-07 MED ORDER — ACETAMINOPHEN 500 MG PO TABS
1000.0000 mg | ORAL_TABLET | Freq: Once | ORAL | Status: AC
  Administered 2023-05-07: 1000 mg via ORAL
  Filled 2023-05-07: qty 2

## 2023-05-07 MED ORDER — LIDOCAINE 2% (20 MG/ML) 5 ML SYRINGE
INTRAMUSCULAR | Status: DC | PRN
  Administered 2023-05-07: 60 mg via INTRAVENOUS

## 2023-05-07 MED ORDER — MIDAZOLAM HCL 2 MG/2ML IJ SOLN
INTRAMUSCULAR | Status: AC
  Administered 2023-05-07: 1 mg via INTRAVENOUS
  Filled 2023-05-07: qty 2

## 2023-05-07 MED ORDER — ORAL CARE MOUTH RINSE: 15.0000 mL | Freq: Once | OROMUCOSAL | Status: AC

## 2023-05-07 MED ORDER — KETOROLAC TROMETHAMINE 30 MG/ML IJ SOLN
INTRAMUSCULAR | Status: DC | PRN
  Administered 2023-05-07: 30 mg via INTRAVENOUS

## 2023-05-07 MED ORDER — POVIDONE-IODINE 10 % EX SWAB
2.0000 | Freq: Once | CUTANEOUS | Status: AC
  Administered 2023-05-07: 2 via TOPICAL

## 2023-05-07 MED ORDER — METOCLOPRAMIDE HCL 5 MG/ML IJ SOLN: 5.0000 mg | Freq: Three times a day (TID) | INTRAMUSCULAR | Status: DC | PRN

## 2023-05-07 MED ORDER — SODIUM CHLORIDE 0.9 % IV SOLN: INTRAVENOUS | Status: DC

## 2023-05-07 MED ORDER — ASPIRIN 81 MG PO CHEW
81.0000 mg | CHEWABLE_TABLET | Freq: Two times a day (BID) | ORAL | Status: DC
  Administered 2023-05-07 – 2023-05-08 (×2): 81 mg via ORAL
  Filled 2023-05-07 (×2): qty 1

## 2023-05-07 MED ORDER — BUPIVACAINE-EPINEPHRINE 0.25% -1:200000 IJ SOLN
INTRAMUSCULAR | Status: DC | PRN
  Administered 2023-05-07: 30 mL

## 2023-05-07 SURGICAL SUPPLY — 60 items
BAG COUNTER SPONGE SURGICOUNT (BAG) ×1 IMPLANT
BANDAGE ESMARK 6X9 LF (GAUZE/BANDAGES/DRESSINGS) ×1 IMPLANT
BLADE SAG 18X100X1.27 (BLADE) ×1 IMPLANT
BNDG ELASTIC 6INX 5YD STR LF (GAUZE/BANDAGES/DRESSINGS) IMPLANT
BNDG ELASTIC 6X5.8 VLCR STR LF (GAUZE/BANDAGES/DRESSINGS) ×2 IMPLANT
BNDG ESMARK 6X9 LF (GAUZE/BANDAGES/DRESSINGS) ×1
BOWL SMART MIX CTS (DISPOSABLE) IMPLANT
CEMENT BONE R 1X40 (Cement) IMPLANT
COMP TIB CMT PS D 0D RT (Joint) ×1 IMPLANT
COMPONENT TIB CMT PS D 0D RT (Joint) IMPLANT
COOLER ICEMAN CLASSIC (MISCELLANEOUS) ×1 IMPLANT
COVER SURGICAL LIGHT HANDLE (MISCELLANEOUS) ×1 IMPLANT
CUFF TOURN SGL QUICK 42 (TOURNIQUET CUFF) IMPLANT
CUFF TRNQT CYL 24X4X40X1 (TOURNIQUET CUFF) IMPLANT
CUFF TRNQT CYL 34X4.125X (TOURNIQUET CUFF) ×1 IMPLANT
DRAPE EXTREMITY T 121X128X90 (DISPOSABLE) ×1 IMPLANT
DRAPE HALF SHEET 40X57 (DRAPES) ×1 IMPLANT
DRAPE U-SHAPE 47X51 STRL (DRAPES) ×1 IMPLANT
DURAPREP 26ML APPLICATOR (WOUND CARE) ×1 IMPLANT
ELECT CAUTERY BLADE 6.4 (BLADE) ×1 IMPLANT
ELECT REM PT RETURN 9FT ADLT (ELECTROSURGICAL) ×1
ELECTRODE REM PT RTRN 9FT ADLT (ELECTROSURGICAL) ×1 IMPLANT
FACESHIELD WRAPAROUND (MASK) ×3
FACESHIELD WRAPAROUND OR TEAM (MASK) ×2 IMPLANT
FEMUR CMT CR STD SZ 8 RT KNEE (Joint) ×1 IMPLANT
FEMUR CMTD CR STD SZ 8 RT KNEE (Joint) IMPLANT
GAUZE PAD ABD 7.5X8 STRL (GAUZE/BANDAGES/DRESSINGS) IMPLANT
GAUZE PAD ABD 8X10 STRL (GAUZE/BANDAGES/DRESSINGS) ×1 IMPLANT
GAUZE SPONGE 4X4 12PLY STRL (GAUZE/BANDAGES/DRESSINGS) ×1 IMPLANT
GAUZE XEROFORM 1X8 LF (GAUZE/BANDAGES/DRESSINGS) ×1 IMPLANT
GLOVE BIOGEL PI IND STRL 8 (GLOVE) ×2 IMPLANT
GLOVE ORTHO TXT STRL SZ7.5 (GLOVE) ×1 IMPLANT
GLOVE SURG ORTHO 8.0 STRL STRW (GLOVE) ×1 IMPLANT
GOWN STRL REUS W/ TWL LRG LVL3 (GOWN DISPOSABLE) IMPLANT
GOWN STRL REUS W/ TWL XL LVL3 (GOWN DISPOSABLE) ×2 IMPLANT
IMMOBILIZER KNEE 22 UNIV (SOFTGOODS) ×1 IMPLANT
INSERT TIB ASF CD/8-9 12 RT (Insert) IMPLANT
IV NS 1000ML BAXH (IV SOLUTION) ×1 IMPLANT
KIT BASIN OR (CUSTOM PROCEDURE TRAY) ×1 IMPLANT
KIT TURNOVER KIT B (KITS) ×1 IMPLANT
MANIFOLD NEPTUNE II (INSTRUMENTS) ×1 IMPLANT
NDL 18GX1X1/2 (RX/OR ONLY) (NEEDLE) IMPLANT
NEEDLE 18GX1X1/2 (RX/OR ONLY) (NEEDLE) ×1
NS IRRIG 1000ML POUR BTL (IV SOLUTION) ×1 IMPLANT
PACK TOTAL JOINT (CUSTOM PROCEDURE TRAY) ×1 IMPLANT
PAD ARMBOARD 7.5X6 YLW CONV (MISCELLANEOUS) ×1 IMPLANT
PAD COLD SHLDR WRAP-ON (PAD) ×1 IMPLANT
PADDING CAST COTTON 6X4 STRL (CAST SUPPLIES) ×1 IMPLANT
SET HNDPC FAN SPRY TIP SCT (DISPOSABLE) ×1 IMPLANT
SET PAD KNEE POSITIONER (MISCELLANEOUS) ×1 IMPLANT
STAPLER VISISTAT 35W (STAPLE) ×1 IMPLANT
STEM POLY PAT PLY 32M KNEE (Knees) IMPLANT
SUCTION TUBE FRAZIER 10FR DISP (SUCTIONS) ×1 IMPLANT
SUT VIC AB 0 CT1 27XBRD ANBCTR (SUTURE) ×1 IMPLANT
SUT VIC AB 1 CT1 27XBRD ANBCTR (SUTURE) ×2 IMPLANT
SUT VIC AB 2-0 CT1 TAPERPNT 27 (SUTURE) ×2 IMPLANT
SYR 50ML LL SCALE MARK (SYRINGE) IMPLANT
TOWEL GREEN STERILE (TOWEL DISPOSABLE) ×1 IMPLANT
TOWEL GREEN STERILE FF (TOWEL DISPOSABLE) ×1 IMPLANT
TRAY CATH INTERMITTENT SS 16FR (CATHETERS) IMPLANT

## 2023-05-07 NOTE — Anesthesia Procedure Notes (Signed)
 Spinal  Patient location during procedure: OR Start time: 05/07/2023 12:35 PM End time: 05/07/2023 12:40 PM Reason for block: surgical anesthesia Staffing Performed: anesthesiologist  Anesthesiologist: Merla Almarie HERO, DO Performed by: Merla Almarie HERO, DO Authorized by: Merla Almarie HERO, DO   Preanesthetic Checklist Completed: patient identified, IV checked, risks and benefits discussed, surgical consent, monitors and equipment checked, pre-op evaluation and timeout performed Spinal Block Patient position: sitting Prep: DuraPrep and site prepped and draped Patient monitoring: cardiac monitor, continuous pulse ox and blood pressure Approach: midline Location: L3-4 Injection technique: single-shot Needle Needle type: Pencan  Needle gauge: 24 G Needle length: 9 cm Assessment Sensory level: T6 Events: CSF return Additional Notes Functioning IV was confirmed and monitors were applied. Sterile prep and drape, including hand hygiene and sterile gloves were used. The patient was positioned and the spine was prepped. The skin was anesthetized with lidocaine .  Free flow of clear CSF was obtained prior to injecting local anesthetic into the CSF.  The spinal needle aspirated freely following injection.  The needle was carefully withdrawn.  The patient tolerated the procedure well.

## 2023-05-07 NOTE — Anesthesia Postprocedure Evaluation (Signed)
 Anesthesia Post Note  Patient: Zennie C Nickelson  Procedure(s) Performed: RIGHT TOTAL KNEE ARTHROPLASTY (Right: Knee)     Patient location during evaluation: PACU Anesthesia Type: Regional, MAC and Spinal Level of consciousness: awake and alert and oriented Pain management: pain level controlled Vital Signs Assessment: post-procedure vital signs reviewed and stable Respiratory status: spontaneous breathing, nonlabored ventilation and respiratory function stable Cardiovascular status: blood pressure returned to baseline and stable Postop Assessment: no headache, no backache, spinal receding and patient able to bend at knees Anesthetic complications: no   No notable events documented.  Last Vitals:  Vitals:   05/07/23 1420 05/07/23 1430  BP: (!) 103/48 (!) 100/47  Pulse: 68 65  Resp: 14 16  Temp: 36.9 C   SpO2: 93% 97%    Last Pain:  Vitals:   05/07/23 1420  TempSrc:   PainSc: 0-No pain                 Almarie CHRISTELLA Marchi

## 2023-05-07 NOTE — Op Note (Signed)
 Operative Note  Date of operation: 05/07/2023 Preoperative diagnosis: Right knee primary osteoarthritis Postoperative diagnosis: Same  Procedure: Right cemented total knee arthroplasty  Implants: Biomet/Zimmer persona cemented knee system Implant Name Type Inv. Item Serial No. Manufacturer Lot No. LRB No. Used Action  CEMENT BONE R 1X40 - [id number] Cement CEMENT BONE R 1X40  ZIMMER [employer]ON(ORTH,TRAU,BIO,SG) [id number] Right 1 Implanted  CEMENT BONE R 1X40 - [id number] Cement CEMENT BONE R 1X40  [employer]IMMER [employer](ORTH,TRAU,BIO,SG) [id number] Right 1 Implanted  STEM POLY PAT PLY 89M KNEE - [id number] Knees STEM POLY PAT PLY 89M KNEE  ZIMMER [employer]ON(ORTH,TRAU,BIO,SG) [id number] Right 1 Implanted  INSERT TIB ASF CD/8-9 12 RT - [id number] Insert INSERT TIB ASF CD/8-9 12 RT  [employer]IMMER [employer](ORTH,TRAU,BIO,SG) [id number] Right 1 Implanted  FEMUR CMT CR STD SZ 8 RT KNEE - [id number] Joint FEMUR CMT CR STD SZ 8 RT KNEE  ZIMMER [employer]ON(ORTH,TRAU,BIO,SG) [id number] Right 1 Implanted  COMP TIB CMT PS D 0D RT - [id number] Joint COMP TIB CMT PS D 0D RT  ZIMMER [employer]ON(ORTH,TRAU,BIO,SG) [id number] Right 1 Implanted   Surgeon: Lonni GRADE. Vernetta, MD Assistant: Tory Gaskins, PA-C  Anesthesia: #1 right lower extremity adductor canal block, #2 spinal, #3 local Tourniquet time: Under 1 hour EBL: Less than 50 cc Complications: None Antibiotics: IV Ancef   Indications: The patient is a 73 year old female with debilitating end-stage arthritis involving her right knee.  She has tried toes conservative treatment for many years now.  At this point her right knee pain is daily and is definitely affecting her mobility, her quality of life and her actives daily living.  She does wish to proceed with knee replacement surgery.  This is well.  She understands she can be a heightened risk of significant and severe pain given that she is in chronic pain management and does require fentanyl  patches.  She understands also the risks of acute blood  loss anemia, nerve vessel injury, fracture, infection, DVT, implant failure and wound healing issues.  She understands that our goals are hopefully decreased pain, improved mobility, and improved quality of life.  Procedure description: After informed consent was obtained and the appropriate right knee was marked, anesthesia obtained a right lower extremity adductor canal block in order.  The patient was then brought to the operating room and set up on the operating table where spinal anesthesia was obtained.  She was then laid in supine position on the operating table and a Foley catheter was placed.  A nonsterile drain is placed in the upper right thigh and her right thigh, knee, leg and ankle were prepped and draped with DuraPrep and sterile drapes including a sterile stockinette.  A timeout was called and she was identified as the correct patient the correct right knee.  An Esmarch was then used to wrap out the leg and the tourniquet was inflated to 300 mm pressure.  With the knee extended a direct midline incision was made over the patella and carried proximally distally.  Dissection was carried down to the knee joint and a medial parapatellar arthrotomy was made finding a large joint effusion.  With the knee in a flexed position we did find significant varus malalignment of the knee as well as osteophytes in all compartments.  Remnants of the ACL as well as medial lateral meniscus were removed.  Using an extramedullary cutting guide we made our proximal tibia cut correction for varus and valgus and a 7 degree slope.  We made this cut to take 2 mm off the low side and  I did back down to more millimeters.  We then used an intramedullary based cutting guide for distal femur cut setting this for a right knee at 5 degrees externally rotated for 10 mm distal femoral cut.  We made this cut without difficulty and brought the knee back down to full extension and had achieved full extension with a 10 mm extension  block.  We do a VAC to the femur and put a femoral sizing guide based off the epicondylar axis.  Based off of this we chose a size 8 femur.  We put a 4-in-1 cutting block for a size 8 femur and made our anterior and posterior cuts followed by our chamfer cuts.  We then backed the tibia and chose a size D right tibial tray for coverage over the tibial plateau setting the rotation of the tibial tubercle and the femur.  We then trialed our size D right tibial tray combined with our size 8 right CR standard femur.  We placed a 10 mm medial congruent polythene insert for right knee and went up to a 12 mm thickness insert and we are pleased the range of motion and stability with the sensor.  We then made a patella tendon hopefully also a size 32 patella button.  We then removed all trial rotation of the knee and irrigate the knee with normal saline solution.  We then placed Marcaine  with epinephrine  around the arthrotomy.  Next with the knee in a flexed position we dried the knee roll well and then mixed our cement.  We cemented our Biomet/Zimmer persona tibial tray for right knee size D followed by cementing our size 8 right CR standard femur.  We removed excess cement debris from the knee and then placed our 12 mm thickness right medial congruent polythene insert.  We cemented our size 32 patella button.  We did help the knee compressed and extended while the cement hardened.  Once the cemented hardened we put her through range of motion again we are pleased with range of motion and stability.  The tourniquet was let down and hemostasis was obtained with electrocautery.  The arthrotomy was then closed with interrupted #1 Vicryl suture followed by 0 Vicryl for the deep tissue and 2-0 Vicryl for the subcutaneous tissue.  The skin was closed with staples.  Well-padded sterile dressing was applied and the patient was taken off of the operating table and taken to the recovery room in stable condition.  Tory Gaskins, PA-C did  assist during the entire case from beginning the end and his assistance was crucial and medically necessary for for soft tissue management and retraction, helping guide implant placement and a layered closure of the wound.

## 2023-05-07 NOTE — Interval H&P Note (Signed)
 History and Physical Interval Note: The patient understands that she is here today for right total knee replacement to treat her severe right knee pain and arthritis.  There has been no acute or interval change in her medical status.  The risks and benefits of surgery have been discussed in detail and informed consent has been obtained.  The right operative knee has been marked.  05/07/2023 10:56 AM  Morgan Swanson  has presented today for surgery, with the diagnosis of osteoarthritis right knee.  The various methods of treatment have been discussed with the patient and family. After consideration of risks, benefits and other options for treatment, the patient has consented to  Procedure(s): RIGHT TOTAL KNEE ARTHROPLASTY (Right) as a surgical intervention.  The patient's history has been reviewed, patient examined, no change in status, stable for surgery.  I have reviewed the patient's chart and labs.  Questions were answered to the patient's satisfaction.     Morgan Swanson

## 2023-05-07 NOTE — Transfer of Care (Signed)
 Immediate Anesthesia Transfer of Care Note  Patient: Morgan Swanson  Procedure(s) Performed: RIGHT TOTAL KNEE ARTHROPLASTY (Right: Knee)  Patient Location: PACU  Anesthesia Type:Spinal  Level of Consciousness: awake, alert , and oriented  Airway & Oxygen Therapy: Patient Spontanous Breathing and Patient connected to nasal cannula oxygen  Post-op Assessment: Report given to RN and Post -op Vital signs reviewed and stable  Post vital signs: Reviewed and stable  Last Vitals:  Vitals Value Taken Time  BP 103/48 05/07/23 1420  Temp    Pulse 74 05/07/23 1422  Resp 16 05/07/23 1422  SpO2 92 % 05/07/23 1422  Vitals shown include unfiled device data.  Last Pain:  Vitals:   05/07/23 1154  TempSrc:   PainSc: 0-No pain      Patients Stated Pain Goal: 0 (05/07/23 1154)  Complications: No notable events documented.

## 2023-05-07 NOTE — Discharge Instructions (Signed)
 Per [facility] clinic policy, our goal is ensure optimal postoperative pain control with a multimodal pain management strategy. For all [facility] patients, our goal is to wean post-operative narcotic medications by 6 weeks post-operatively. If this is not possible due to utilization of pain medication prior to surgery, your [facility] doctor will support your acute post-operative pain control for the first 6 weeks postoperatively, with a plan to transition you back to your primary pain team following that. Morgan Swanson will work to ensure a Therapist, occupational.  INSTRUCTIONS AFTER JOINT REPLACEMENT   Remove items at home which could result in a fall. This includes throw rugs or furniture in walking pathways ICE to the affected joint every three hours while awake for 30 minutes at a time, for at least the first 3-5 days, and then as needed for pain and swelling.  Continue to use ice for pain and swelling. You may notice swelling that will progress down to the foot and ankle.  This is normal after surgery.  Elevate your leg when you are not up walking on it.   Continue to use the breathing machine you got in the hospital (incentive spirometer) which will help keep your temperature down.  It is common for your temperature to cycle up and down following surgery, especially at night when you are not up moving around and exerting yourself.  The breathing machine keeps your lungs expanded and your temperature down.   DIET:  As you were doing prior to hospitalization, we recommend a well-balanced diet.  DRESSING / WOUND CARE / SHOWERING  Keep the surgical dressing until follow up.  The dressing is water  proof, so you can shower without any extra covering.  IF THE DRESSING FALLS OFF or the wound gets wet inside, change the dressing with sterile gauze.  Please use good hand washing techniques before changing the dressing.  Do not use any lotions or creams on the incision until instructed by your surgeon.     ACTIVITY  Increase activity slowly as tolerated, but follow the weight bearing instructions below.   No driving for 6 weeks or until further direction given by your physician.  You cannot drive while taking narcotics.  No lifting or carrying greater than 10 lbs. until further directed by your surgeon. Avoid periods of inactivity such as sitting longer than an hour when not asleep. This helps prevent blood clots.  You may return to work once you are authorized by your doctor.     WEIGHT BEARING   Weight bearing as tolerated with assist device (walker, cane, etc) as directed, use it as long as suggested by your surgeon or therapist, typically at least 4-6 weeks.   EXERCISES  Results after joint replacement surgery are often greatly improved when you follow the exercise, range of motion and muscle strengthening exercises prescribed by your doctor. Safety measures are also important to protect the joint from further injury. Any time any of these exercises cause you to have increased pain or swelling, decrease what you are doing until you are comfortable again and then slowly increase them. If you have problems or questions, call your caregiver or physical therapist for advice.   Rehabilitation is important following a joint replacement. After just a few days of immobilization, the muscles of the leg can become weakened and shrink (atrophy).  These exercises are designed to build up the tone and strength of the thigh and leg muscles and to improve motion. Often times heat used for twenty to thirty minutes before  working out will loosen up your tissues and help with improving the range of motion but do not use heat for the first two weeks following surgery (sometimes heat can increase post-operative swelling).   These exercises can be done on a training (exercise) mat, on the floor, on a table or on a bed. Use whatever works the best and is most comfortable for you.    Use music or television  while you are exercising so that the exercises are a pleasant break in your day. This will make your life better with the exercises acting as a break in your routine that you can look forward to.   Perform all exercises about fifteen times, three times per day or as directed.  You should exercise both the operative leg and the other leg as well.  Exercises include:   Quad Sets - Tighten up the muscle on the front of the thigh (Quad) and hold for 5-10 seconds.   Straight Leg Raises - With your knee straight (if you were given a brace, keep it on), lift the leg to 60 degrees, hold for 3 seconds, and slowly lower the leg.  Perform this exercise against resistance later as your leg gets stronger.  Leg Slides: Lying on your back, slowly slide your foot toward your buttocks, bending your knee up off the floor (only go as far as is comfortable). Then slowly slide your foot back down until your leg is flat on the floor again.  Angel Wings: Lying on your back spread your legs to the side as far apart as you can without causing discomfort.  Hamstring Strength:  Lying on your back, push your heel against the floor with your leg straight by tightening up the muscles of your buttocks.  Repeat, but this time bend your knee to a comfortable angle, and push your heel against the floor.  You may put a pillow under the heel to make it more comfortable if necessary.   A rehabilitation program following joint replacement surgery can speed recovery and prevent re-injury in the future due to weakened muscles. Contact your doctor or a physical therapist for more information on knee rehabilitation.    CONSTIPATION  Constipation is defined medically as fewer than three stools per week and severe constipation as less than one stool per week.  Even if you have a regular bowel pattern at home, your normal regimen is likely to be disrupted due to multiple reasons following surgery.  Combination of anesthesia, postoperative  narcotics, change in appetite and fluid intake all can affect your bowels.   YOU MUST use at least one of the following options; they are listed in order of increasing strength to get the job done.  They are all available over the counter, and you may need to use some, POSSIBLY even all of these options:    Drink plenty of fluids (prune juice may be helpful) and high fiber foods Colace 100 mg by mouth twice a day  Senokot for constipation as directed and as needed Dulcolax (bisacodyl), take with full glass of water  Miralax (polyethylene glycol) once or twice a day as needed.  If you have tried all these things and are unable to have a bowel movement in the first 3-4 days after surgery call either your surgeon or your primary doctor.    If you experience loose stools or diarrhea, hold the medications until you stool forms back up.  If your symptoms do not get better within 1 week  or if they get worse, check with your doctor.  If you experience the worst abdominal pain ever or develop nausea or vomiting, please contact the office immediately for further recommendations for treatment.   ITCHING:  If you experience itching with your medications, try taking only a single pain pill, or even half a pain pill at a time.  You can also use Benadryl  over the counter for itching or also to help with sleep.   TED HOSE STOCKINGS:  Use stockings on both legs until for at least 2 weeks or as directed by physician office. They may be removed at night for sleeping.  MEDICATIONS:  See your medication summary on the "After Visit Summary" that nursing will review with you.  You may have some home medications which will be placed on hold until you complete the course of blood thinner medication.  It is important for you to complete the blood thinner medication as prescribed.  PRECAUTIONS:  If you experience chest pain or shortness of breath - call 911 immediately for transfer to the hospital emergency department.    If you develop a fever greater that 101 F, purulent drainage from wound, increased redness or drainage from wound, foul odor from the wound/dressing, or calf pain - CONTACT YOUR SURGEON.                                                   FOLLOW-UP APPOINTMENTS:  If you do not already have a post-op appointment, please call the office for an appointment to be seen by your surgeon.  Guidelines for how soon to be seen are listed in your "After Visit Summary", but are typically between 1-4 weeks after surgery.  OTHER INSTRUCTIONS:   Knee Replacement:  Do not place pillow under knee, focus on keeping the knee straight while resting. CPM instructions: 0-90 degrees, 2 hours in the morning, 2 hours in the afternoon, and 2 hours in the evening. Place foam block, curve side up under heel at all times except when in CPM or when walking.  DO NOT modify, tear, cut, or change the foam block in any way.  POST-OPERATIVE OPIOID TAPER INSTRUCTIONS: It is important to wean off of your opioid medication as soon as possible. If you do not need pain medication after your surgery it is ok to stop day one. Opioids include: Codeine, Hydrocodone(Norco, Vicodin), Oxycodone (Percocet, oxycontin ) and hydromorphone  amongst others.  Long term and even short term use of opiods can cause: Increased pain response Dependence Constipation Depression Respiratory depression And more.  Withdrawal symptoms can include Flu like symptoms Nausea, vomiting And more Techniques to manage these symptoms Hydrate well Eat regular healthy meals Stay active Use relaxation techniques(deep breathing, meditating, yoga) Do Not substitute Alcohol to help with tapering If you have been on opioids for less than two weeks and do not have pain than it is ok to stop all together.  Plan to wean off of opioids This plan should start within one week post op of your joint replacement. Maintain the same interval or time between taking each dose  and first decrease the dose.  Cut the total daily intake of opioids by one tablet each day Next start to increase the time between doses. The last dose that should be eliminated is the evening dose.   MAKE SURE YOU:  Understand these instructions.  Get help right away if you are not doing well or get worse.    Thank you for letting us  be a part of your medical care team.  It is a privilege we respect greatly.  We hope these instructions will help you stay on track for a fast and full recovery!     Dental Antibiotics:  In most cases prophylactic antibiotics for Dental procdeures after total joint surgery are not necessary.  Exceptions are as follows:  1. History of prior total joint infection  2. Severely immunocompromised (Organ Transplant, cancer chemotherapy, Rheumatoid biologic meds such as Humera)  3. Poorly controlled diabetes (A1C &gt; 8.0, blood glucose over 200)  If you have one of these conditions, contact your surgeon for an antibiotic prescription, prior to your dental procedure.

## 2023-05-07 NOTE — Plan of Care (Signed)
   Problem: Education: Goal: Knowledge of General Education information will improve Description: Including pain rating scale, medication(s)/side effects and non-pharmacologic comfort measures Outcome: Progressing   Problem: Clinical Measurements: Goal: Will remain free from infection Outcome: Progressing   Problem: Activity: Goal: Risk for activity intolerance will decrease Outcome: Progressing   Problem: Nutrition: Goal: Adequate nutrition will be maintained Outcome: Progressing   Problem: Pain Managment: Goal: General experience of comfort will improve and/or be controlled Outcome: Progressing

## 2023-05-07 NOTE — Plan of Care (Signed)

## 2023-05-08 ENCOUNTER — Encounter (HOSPITAL_COMMUNITY): Payer: Self-pay | Admitting: Orthopaedic Surgery

## 2023-05-08 DIAGNOSIS — M1711 Unilateral primary osteoarthritis, right knee: Secondary | ICD-10-CM | POA: Diagnosis not present

## 2023-05-08 LAB — BASIC METABOLIC PANEL
Anion gap: 11 (ref 5–15)
BUN: 13 mg/dL (ref 8–23)
CO2: 26 mmol/L (ref 22–32)
Calcium: 9.3 mg/dL (ref 8.9–10.3)
Chloride: 101 mmol/L (ref 98–111)
Creatinine, Ser: 0.75 mg/dL (ref 0.44–1.00)
GFR, Estimated: >60 mL/min (ref >60)
Glucose, Bld: 117 mg/dL — ABNORMAL HIGH (ref 70–99)
Potassium: 4.2 mmol/L (ref 3.5–5.1)
Sodium: 138 mmol/L (ref 135–145)

## 2023-05-08 LAB — CBC
HCT: 36.3 % (ref 36.0–46.0)
Hemoglobin: 11.9 g/dL — ABNORMAL LOW (ref 12.0–15.0)
MCH: 29.8 pg (ref 26.0–34.0)
MCHC: 32.8 g/dL (ref 30.0–36.0)
MCV: 91 fL (ref 80.0–100.0)
Platelets: 302 10*3/uL (ref 150–400)
RBC: 3.99 MIL/uL (ref 3.87–5.11)
RDW: 13.1 % (ref 11.5–15.5)
WBC: 9.4 10*3/uL (ref 4.0–10.5)
nRBC: 0 % (ref 0.0–0.2)

## 2023-05-08 MED ORDER — TIZANIDINE HCL 4 MG PO TABS: 4.0000 mg | ORAL_TABLET | Freq: Four times a day (QID) | ORAL | 0 refills | Status: DC | PRN

## 2023-05-08 MED ORDER — ASPIRIN 81 MG PO CHEW: 81.0000 mg | CHEWABLE_TABLET | Freq: Two times a day (BID) | ORAL | 0 refills | Status: DC

## 2023-05-08 MED ORDER — OXYCODONE HCL 5 MG PO TABS: 5.0000 mg | ORAL_TABLET | Freq: Four times a day (QID) | ORAL | 0 refills | Status: DC | PRN

## 2023-05-08 NOTE — Discharge Summary (Signed)
 Patient ID: Morgan Swanson MRN: [id number] DOB/AGE: [birth year] 73 y.o.  Admit date: 05/07/2023 Discharge date: 05/08/2023  Admission Diagnoses:  Principal Problem:   Unilateral primary osteoarthritis, right knee Active Problems:   Status post total right knee replacement   Discharge Diagnoses:  Same  Past Medical History:  Diagnosis Date   Anxiety    Arthritis    back    Back pain    Cataract    small immature   Depression    GERD (gastroesophageal reflux disease)    Hyperlipidemia    Hypertension    Spinal stenosis, lumbar region, without neurogenic claudication     Surgeries: Procedure(s): RIGHT TOTAL KNEE ARTHROPLASTY on 05/07/2023   Consultants:   Discharged Condition: Improved  Hospital Course: LIZZA HUFFAKER is an 73 y.o. female who was admitted 05/07/2023 for operative treatment ofUnilateral primary osteoarthritis, right knee. Patient has severe unremitting pain that affects sleep, daily activities, and work/hobbies. After pre-op clearance the patient was taken to the operating room on 05/07/2023 and underwent  Procedure(s): RIGHT TOTAL KNEE ARTHROPLASTY.    Patient was given perioperative antibiotics:  Anti-infectives (From admission, onward)    Start     Dose/Rate Route Frequency Ordered Stop   05/07/23 1930  ceFAZolin  (ANCEF ) IVPB 2g/100 mL premix        2 g 200 mL/hr over 30 Minutes Intravenous Every 6 hours 05/07/23 1738 05/08/23 0100   05/07/23 1000  ceFAZolin  (ANCEF ) IVPB 2g/100 mL premix        2 g 200 mL/hr over 30 Minutes Intravenous On call to O.R. 05/07/23 0955 05/07/23 1230        Patient was given sequential compression devices, early ambulation, and chemoprophylaxis to prevent DVT.  Patient benefited maximally from hospital stay and there were no complications.    Recent vital signs: Patient Vitals for the past 24 hrs:  BP Temp Temp src Pulse Resp SpO2  05/08/23 0516 112/64 98 F (36.7 C) Oral (!) 50 18 98 %  05/07/23 2130 (!) 113/55  -- -- 74 -- --  05/07/23 1913 (!) 113/55 98 F (36.7 C) Oral 74 19 100 %  05/07/23 1733 110/67 97.7 F (36.5 C) Oral 78 18 100 %  05/07/23 1715 114/67 -- -- 67 12 96 %  05/07/23 1645 (!) 109/59 98 F (36.7 C) -- 66 14 96 %  05/07/23 1630 116/67 -- -- 68 16 94 %  05/07/23 1615 107/63 -- -- 69 19 94 %  05/07/23 1600 114/61 -- -- 67 14 95 %  05/07/23 1545 (!) 101/55 -- -- 64 13 96 %  05/07/23 1530 (!) 105/58 -- -- 67 15 95 %  05/07/23 1515 (!) 101/56 -- -- 64 14 95 %  05/07/23 1500 (!) 102/59 -- -- 73 15 98 %  05/07/23 1445 (!) 98/51 -- -- 70 16 97 %  05/07/23 1430 (!) 100/47 -- -- 65 16 97 %  05/07/23 1420 (!) 103/48 98.4 F (36.9 C) -- 68 14 93 %     Recent laboratory studies:  Recent Labs    05/08/23 0551  WBC 9.4  HGB 11.9*  HCT 36.3  PLT 302  NA 138  K 4.2  CL 101  CO2 26  BUN 13  CREATININE 0.75  GLUCOSE 117*  CALCIUM  9.3     Discharge Medications:   Allergies as of 05/08/2023       Reactions   Lisinopril Cough   Penicillins    Unknown childhood allergy  Codeine Palpitations   Feels funny, drunk        Medication List     TAKE these medications    amLODipine  10 MG tablet Commonly known as: NORVASC  Take 1 tablet by mouth daily.   aspirin  81 MG chewable tablet Chew 1 tablet (81 mg total) by mouth 2 (two) times daily.   baclofen  10 MG tablet Commonly known as: LIORESAL  Take 1 tablet (10 mg total) by mouth at bedtime as needed for muscle spasms. What changed: when to take this   BC FAST PAIN RELIEF PO Take 1 packet by mouth daily as needed (pain).   busPIRone  15 MG tablet Commonly known as: BUSPAR  Take 15 mg by mouth 2 (two) times daily.   Crestor  20 MG tablet Generic drug: rosuvastatin  Take 1 tablet by mouth daily.   fentaNYL  12 MCG/HR Commonly known as: DURAGESIC  Place 1 patch onto the skin every 3 (three) days. Patient has drug agreement,   gabapentin  100 MG capsule Commonly known as: NEURONTIN  Take 1 capsule (100 mg total) by  mouth 3 (three) times daily.   metoprolol  tartrate 25 MG tablet Commonly known as: LOPRESSOR  TAKE 1 TABLET(25 MG) BY MOUTH TWICE DAILY   omeprazole 40 MG capsule Commonly known as: PRILOSEC Take 1 capsule by mouth daily.   oxyCODONE  5 MG immediate release tablet Commonly known as: Oxy IR/ROXICODONE  Take 1-2 tablets (5-10 mg total) by mouth every 6 (six) hours as needed for moderate pain (pain score 4-6) (pain score 4-6).   sertraline  25 MG tablet Commonly known as: ZOLOFT  Take 25 mg by mouth daily.   tiZANidine  4 MG tablet Commonly known as: ZANAFLEX  Take 1 tablet (4 mg total) by mouth every 6 (six) hours as needed for muscle spasms.   triamterene -hydrochlorothiazide  37.5-25 MG tablet Commonly known as: MAXZIDE -25 Take 0.5 tablets by mouth daily.               Durable Medical Equipment  (From admission, onward)           Start     Ordered   05/08/23 0937  DME 3 n 1  Once       Comments: Bedside commode, confined to one room   05/08/23 0937   05/07/23 1739  DME Walker rolling  Once       Question Answer Comment  Walker: With 5 Inch Wheels   Patient needs a walker to treat with the following condition Status post total right knee replacement      05/07/23 1738            Diagnostic Studies: DG Knee Right Port Result Date: 05/07/2023 CLINICAL DATA:  Status post right knee replacement. EXAM: PORTABLE RIGHT KNEE - 1-2 VIEW COMPARISON:  None Available. FINDINGS: Right knee arthroplasty in expected alignment. No periprosthetic lucency or fracture. There has been patellar resurfacing. Recent postsurgical change includes air and edema in the soft tissues and joint space. Anterior skin staples. IMPRESSION: Right knee arthroplasty without immediate postoperative complication. Electronically Signed   By: Andrea Gasman M.D.   On: 05/07/2023 15:15    Disposition: Discharge disposition: 01-Home or Self Care          Follow-up Information     Vernetta Lonni GRADE, MD Follow up in 2 week(s).   Specialty: Orthopedic Surgery Contact information: [address] [place] [address] [phone]         Earvin Johnston PARAS, FNP Follow up.   Specialty: Family Medicine Contact information: [address]  [place] [address] [phone]                  Signed: [name] 05/08/2023, 12:53 PM

## 2023-05-08 NOTE — Evaluation (Signed)
 Physical Therapy Evaluation and Discharge Patient Details Name: Morgan Swanson MRN: [id number] DOB: May 11, 1950 Today's Date: 05/08/2023  History of Present Illness  Pt is a 73 y.o. F who presents 05/07/2023 for right total knee arthroplasty. Significant PMH: none.  Clinical Impression  Pt admitted s/p R TKA. Overall, she is mobilizing well and verbalizes good pain control. Pt ambulating household distance with RW and negotiated steps to simulate home entrance. Reviewed HEP and provided written handout. Pt with no further questions/concerns. Will benefit from follow up PT at d/c to address ROM, strengthening, and gait. No further acute PT needs; thank you for this consult.      If plan is discharge home, recommend the following: Assistance with cooking/housework;Assist for transportation   Can travel by private vehicle        Equipment Recommendations Rolling walker (2 wheels);BSC/3in1  Recommendations for Other Services       Functional Status Assessment Patient has had a recent decline in their functional status and demonstrates the ability to make significant improvements in function in a reasonable and predictable amount of time.     Precautions / Restrictions Precautions Precautions: None Restrictions Weight Bearing Restrictions Per Provider Order: No      Mobility  Bed Mobility Overal bed mobility: Modified Independent                  Transfers Overall transfer level: Modified independent Equipment used: Rolling walker (2 wheels)                    Ambulation/Gait Ambulation/Gait assistance: Modified independent (Device/Increase time) Gait Distance (Feet): 150 Feet Assistive device: Rolling walker (2 wheels) Gait Pattern/deviations: Step-through pattern, Decreased weight shift to right, Decreased stance time - right Gait velocity: decreased     General Gait Details: Instruction for sequencing/technique, walker use, WBAT  Stairs Stairs:  Yes Stairs assistance: Supervision Stair Management: No rails, With walker Number of Stairs: 2 General stair comments: Visual and verbal instruction for sequencing/technique with use of walker and having helper for stabilization  Wheelchair Mobility     Tilt Bed    Modified [place] (Stroke Patients Only)       Balance Overall balance assessment: Mild deficits observed, not formally tested                                           Pertinent Vitals/Pain Pain Assessment Pain Assessment: Faces Faces Pain Scale: Hurts a little bit Pain Location: R knee Pain Descriptors / Indicators: Operative site guarding Pain Intervention(s): Monitored during session, Ice applied    Home Living Family/patient expects to be discharged to:: Private residence Living Arrangements: Other relatives (granddaughter) Available Help at Discharge: Family Type of Home: House Home Access: Stairs to enter Entrance Stairs-Rails: None Secretary/administrator of Steps: 2   Home Layout: One level Home Equipment: None      Prior Function Prior Level of Function : Independent/Modified Independent             Mobility Comments: Health and safety inspector       Extremity/Trunk Assessment   Upper Extremity Assessment Upper Extremity Assessment: Overall WFL for tasks assessed    Lower Extremity Assessment Lower Extremity Assessment: RLE deficits/detail RLE Deficits / Details: Expected post op deficits. Able to perform limited SLR and LAQ    Cervical / Trunk Assessment Cervical / Trunk Assessment: Normal  Communication   Communication Communication: No apparent difficulties  Cognition Arousal: Alert Behavior During Therapy: WFL for tasks assessed/performed Overall Cognitive Status: Within Functional Limits for tasks assessed                                          General Comments      Exercises Total Joint Exercises Ankle Circles/Pumps:  Both, 10 reps, Supine Quad Sets: Right, 5 reps, Supine Heel Slides: Right, 5 reps, Supine Hip ABduction/ADduction: Right, 5 reps, Supine Straight Leg Raises: Right, 5 reps, Supine Long Arc Quad: Right, 10 reps, Seated Knee Flexion: Right, 5 reps, Seated Goniometric ROM: 0-101 degrees   Assessment/Plan    PT Assessment Patient does not need any further PT services  PT Problem List         PT Treatment Interventions      PT Goals (Current goals can be found in the Care Plan section)  Acute Rehab PT Goals Patient Stated Goal: independence PT Goal Formulation: All assessment and education complete, DC therapy    Frequency       Co-evaluation               AM-PAC PT 6 Clicks Mobility  Outcome Measure Help needed turning from your back to your side while in a flat bed without using bedrails?: None Help needed moving from lying on your back to sitting on the side of a flat bed without using bedrails?: None Help needed moving to and from a bed to a chair (including a wheelchair)?: None Help needed standing up from a chair using your arms (e.g., wheelchair or bedside chair)?: None Help needed to walk in hospital room?: None Help needed climbing 3-5 steps with a railing? : None 6 Click Score: 24    End of Session Equipment Utilized During Treatment: Gait belt Activity Tolerance: Patient tolerated treatment well Patient left: in chair;with call bell/phone within reach Nurse Communication: Mobility status PT Visit Diagnosis: Pain;Difficulty in walking, not elsewhere classified (R26.2) Pain - Right/Left: Right Pain - part of body: Knee    Time: 0810-0850 PT Time Calculation (min) (ACUTE ONLY): 40 min   Charges:   PT Evaluation $PT Eval Low Complexity: 1 Low PT Treatments $Gait Training: 8-22 mins $Therapeutic Exercise: 8-22 mins PT General Charges $$ ACUTE PT VISIT: 1 Visit         [name], PT, DPT Acute Rehabilitation Services Office  [phone]   Morgan Swanson 05/08/2023, 9:46 AM

## 2023-05-08 NOTE — Progress Notes (Signed)
 Discharge summary (AVS) packet provided to pt with instructions. Pt verbalized understanding of instructions. NO complaints. DME RW & BSC provided to pt. Pt d/c to home as ordered, She remains alert/oriented in no apparent distress.

## 2023-05-08 NOTE — Progress Notes (Signed)
    Durable Medical Equipment  (From admission, onward)           Start     Ordered   05/08/23 0937  DME 3 n 1  Once       Comments: Bedside commode, confined to one room   05/08/23 0937   05/07/23 1739  DME Walker rolling  Once       Question Answer Comment  Walker: With 5 Inch Wheels   Patient needs a walker to treat with the following condition Status post total right knee replacement      05/07/23 1738

## 2023-05-08 NOTE — Progress Notes (Signed)
 PT Cancellation Note  Patient Details Name: Morgan Swanson MRN: [id number] DOB: 09-01-1950   Cancelled Treatment:     Imminent d/c order received. Pt cleared PT this AM after ambulating a household distance (~150 ft), performing HEP and practicing stairs. Re-entered pt room and discussed and pt understanding and has no further questions/concerns. Please see evaluation note from 9:47 AM for further details regarding pt mobility.   [name], PT, DPT Acute Rehabilitation Services Office [phone]    Morgan Swanson 05/08/2023, 11:51 AM

## 2023-05-08 NOTE — TOC Transition Note (Signed)
 Transition of Care [facility]) - Discharge Note   Patient Details  Name: Morgan Swanson MRN: [id number] Date of Birth: April 24, 1950  Transition of Care [facility] CM/SW Contact:  Rosalva Jon Bloch, RN Phone Number: 05/08/2023, 9:53 AM   Clinical Narrative:    Patient will DC to: home Anticipated DC date: 05/08/2023 Family notified: yes Transport by: car       -  s/p right total knee arthroplasty  Per MD patient ready for DC today pending therapy clearance. RN, patient, patient's family, and [facility] ( prearranged by provider) notified of DC. Pt states friend to assist with care once home if needed.   Orders noted for RW and BSC.  Pt agreeable. Referral made with [facility] for RW and [facility] Equipment will be delivered to bedside prior to discharge. Post hospital f/u noted on AVS. Friend to provide transportation to home.  RNCM will sign off for now as intervention is no longer needed. Please consult us  again if new needs arise.    Final next level of care: Home w Home Health Services Barriers to Discharge: No Barriers Identified   Patient Goals and CMS Choice     Choice offered to / list presented to : Patient      Discharge Placement                       Discharge Plan and Services Additional resources added to the After Visit Summary for                  DME Arranged: Walker rolling, Bedside commode DME Agency: [employer] Date DME Agency Contacted: 05/08/23 Time DME Agency Contacted: [phone] Representative spoke with at DME Agency: [place] [facility] Arranged: PT HH Agency: [facility] Date [facility] Contacted: 05/08/23 Time HH Agency Contacted: [phone]    Social Drivers of Health (SDOH) Interventions SDOH Screenings   Food Insecurity: No Food Insecurity (05/07/2023)  Housing: Unknown (05/07/2023)  Transportation Needs: No Transportation Needs (05/07/2023)  Utilities: Not At Risk (05/07/2023)  Financial Resource Strain: Not on File  (07/20/2021)   Received from [place], [place]  Physical Activity: Not on File (07/20/2021)   Received from [place], [place]  Social Connections: Not on File (12/13/2022)   Received from [facility]  Stress: Not on File (07/20/2021)   Received from [place], [place]  Tobacco Use: Medium Risk (05/07/2023)     Readmission Risk Interventions     No data to display

## 2023-05-08 NOTE — Progress Notes (Signed)
 Subjective: 1 Day Post-Op Procedure(s) (LRB): RIGHT TOTAL KNEE ARTHROPLASTY (Right) Patient reports pain as moderate.    Objective: Vital signs in last 24 hours: Temp:  [97.7 F (36.5 C)-98.6 F (37 C)] 98 F (36.7 C) (02/05 0516) Pulse Rate:  [47-78] 50 (02/05 0516) Resp:  [11-19] 18 (02/05 0516) BP: (98-137)/(47-77) 112/64 (02/05 0516) SpO2:  [93 %-100 %] 98 % (02/05 0516) Weight:  [64.4 kg] 64.4 kg (02/04 1000)  Intake/Output from previous day: 02/04 0701 - 02/05 0700 In: 900 [I.V.:900] Out: 1550 [Urine:1500; Blood:50] Intake/Output this shift: No intake/output data recorded.  Recent Labs    05/08/23 0551  HGB 11.9*   Recent Labs    05/08/23 0551  WBC 9.4  RBC 3.99  HCT 36.3  PLT 302   Recent Labs    05/08/23 0551  NA 138  K 4.2  CL 101  CO2 26  BUN 13  CREATININE 0.75  GLUCOSE 117*  CALCIUM  9.3   No results for input(s): LABPT, INR in the last 72 hours.  Sensation intact distally Intact pulses distally Dorsiflexion/Plantar flexion intact Incision: dressing C/D/I Compartment soft   Assessment/Plan: 1 Day Post-Op Procedure(s) (LRB): RIGHT TOTAL KNEE ARTHROPLASTY (Right) Up with therapy Discharge home with home health potentially this afternoon if she clears PT.      [name] 05/08/2023, 7:29 AM

## 2023-05-08 NOTE — Progress Notes (Signed)
 Patient ID: Morgan Swanson, female   DOB: 22-May-1950, 73 y.o.   MRN: [id number] The patient is sitting comfortably in her chair at the bedside.  She has good pain control.  She reports that she has not been up with therapy.  She still would rather go home this afternoon once therapy sees her.  I will go ahead and put in for discharge this afternoon after her PT session if she is doing well.

## 2023-05-20 ENCOUNTER — Ambulatory Visit (INDEPENDENT_AMBULATORY_CARE_PROVIDER_SITE_OTHER): Payer: 59 | Admitting: Orthopaedic Surgery

## 2023-05-20 ENCOUNTER — Encounter: Payer: Self-pay | Admitting: Orthopaedic Surgery

## 2023-05-20 DIAGNOSIS — Z96651 Presence of right artificial knee joint: Secondary | ICD-10-CM

## 2023-05-20 MED ORDER — OXYCODONE HCL 5 MG PO TABS: 5.0000 mg | ORAL_TABLET | Freq: Four times a day (QID) | ORAL | 0 refills | Status: DC | PRN

## 2023-05-20 NOTE — Progress Notes (Signed)
 The patient is here for first postoperative visit status post a right total knee replacement.  She reports good range of motion and strength.  She has had home therapy and now needs to transition outpatient therapy.  Her right knee incision looks good.  The staples were removed and Steri-Strips applied.  Her extension is almost full and her flexion is to at least 90 degrees.  Her calf is soft.  She can stop her baby aspirin twice daily.  She is on Duragesic patches for chronic pain and I will send in some more oxycodone.  We will see her back in a month to assess her range of motion after her attending outpatient physical therapy.

## 2023-05-21 ENCOUNTER — Other Ambulatory Visit: Payer: Self-pay

## 2023-05-21 DIAGNOSIS — Z96651 Presence of right artificial knee joint: Secondary | ICD-10-CM

## 2023-05-27 ENCOUNTER — Ambulatory Visit (INDEPENDENT_AMBULATORY_CARE_PROVIDER_SITE_OTHER): Payer: 59 | Admitting: Physical Therapy

## 2023-05-27 ENCOUNTER — Encounter: Payer: Self-pay | Admitting: Physical Therapy

## 2023-05-27 DIAGNOSIS — R600 Localized edema: Secondary | ICD-10-CM

## 2023-05-27 DIAGNOSIS — M25661 Stiffness of right knee, not elsewhere classified: Secondary | ICD-10-CM

## 2023-05-27 DIAGNOSIS — M6281 Muscle weakness (generalized): Secondary | ICD-10-CM | POA: Diagnosis not present

## 2023-05-27 DIAGNOSIS — R262 Difficulty in walking, not elsewhere classified: Secondary | ICD-10-CM | POA: Diagnosis not present

## 2023-05-27 DIAGNOSIS — M25561 Pain in right knee: Secondary | ICD-10-CM | POA: Diagnosis not present

## 2023-05-27 NOTE — Therapy (Signed)
 OUTPATIENT PHYSICAL THERAPY LOWER EXTREMITY EVALUATION   Patient Name: Morgan Swanson MRN: [id number] DOB:June 25, 1950, 73 y.o., female Today's Date: 05/27/2023  END OF SESSION:  PT End of Session - 05/27/23 1514     Visit Number 1    Number of Visits 18    Date for PT Re-Evaluation 07/22/23    Progress Note Due on Visit 10    PT Start Time 1430    PT Stop Time 1510    PT Time Calculation (min) 40 min    Activity Tolerance Patient tolerated treatment well    Behavior During Therapy WFL for tasks assessed/performed             Past Medical History:  Diagnosis Date   Anxiety    Arthritis    back    Back pain    Cataract    small immature   Depression    GERD (gastroesophageal reflux disease)    Hyperlipidemia    Hypertension    Spinal stenosis, lumbar region, without neurogenic claudication    Past Surgical History:  Procedure Laterality Date   30-Day Event Monitor  08/2016   15 events noted. Mostly sinus rhythm with occasional bradycardia. Low rate 50. Average rate 70 bpm. Rare PACs but no runs or couplets. No arrhythmia noted.   CHOLECYSTECTOMY     HAND SURGERY     fatty tissue removed    TOTAL KNEE ARTHROPLASTY Right 05/07/2023   Procedure: RIGHT TOTAL KNEE ARTHROPLASTY;  Surgeon: Kathryne Hitch, MD;  Location: [facility] OR;  Service: Orthopedics;  Laterality: Right;   TRANSTHORACIC ECHOCARDIOGRAM  12/2001; 09/2016   a.Normal LV size and function. EF 55-65%. Normal regional wall motion. No valvular lesions;; b. October 04, 2016: Normal LV size with mild LV hypertrophy. EF 55-60%. Mildly increased RV size with normal systolic function. No significant valvular abnormalities   VAGINAL HYSTERECTOMY  1983   Zio Patch Monitor  08/2021   Mostly sinus rhythm, occasional (3.3%) isolated PACs.  Multiple (157) runs of PAT: Fastest- 5&6 beats, max 184 bpm; longest 16 beats (7.7 Sec) avg rate 128 bpm; symptoms mostly noted with sinus rhythm and PACs.  137 triggers most were not  SVT/PAT episodes.   Patient Active Problem List   Diagnosis Date Noted   Status post total right knee replacement 05/07/2023   Lumbar radiculopathy 03/15/2021   Chronic back pain 07/09/2017   Essential hypertension 09/19/2016   Heart palpitations 09/19/2016   Systolic murmur 09/19/2016   Chronic bilateral low back pain with sciatica 09/10/2016   Lumbosacral root lesions 01/25/2014   Spinal stenosis, lumbar region, without neurogenic claudication 07/21/2012   Encounter for long-term (current) use of other medications 07/21/2012   Lumbosacral root lesions, not elsewhere classified 07/21/2012    PCP: Dot Been, FNP   REFERRING PROVIDER: Kathryne Hitch*   REFERRING DIAG: [id number] (ICD-10-CM) - Status post total right knee replacement  THERAPY DIAG:  Acute pain of right knee  Stiffness of right knee, not elsewhere classified  Muscle weakness (generalized)  Difficulty in walking, not elsewhere classified  Localized edema  Rationale for Evaluation and Treatment: Rehabilitation  ONSET DATE: s/p right TKA 05/07/23  SUBJECTIVE:   SUBJECTIVE STATEMENT: Relays the pain is pretty good at the moment, she does feel she can not walk and stand the way she would like too. She has some difficulty with stairs and housework. She does report numbness in her Rt foot but this was also present before surgery due to nerve damage  but is worse since surgery.  PERTINENT HISTORY: Post op status, sciatica and reported nerve damage in right foot PAIN:  NPRS scale: 1/10 upon arrival Pain location:Rt knee Pain description: constant, achy, sharp Aggravating factors: stairs, housework, sleeping Relieving factors: meds   PRECAUTIONS: None  RED FLAGS: None   WEIGHT BEARING RESTRICTIONS: No  FALLS:  Has patient fallen in last 6 months? 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  DIAGNOSTIC FINDINGS: 05/07/23 XR IMPRESSION: Right knee arthroplasty without immediate postoperative complication.  PATIENT SURVEYS:  Patient-Specific Activity Scoring Scheme  "0" represents "unable to perform." "10" represents "able to perform at prior level. 0 1 2 3 4 5 6 7 8 9  10 (Date and Score)   Activity Eval     1. cooking  1    2.       3.     4.    5.     Total score = sum of the activity scores/number of activities Minimum detectable change (90%CI) for average score = 2 points Minimum detectable change (90%CI) for single activity score = 3 points   Score: 1/10 at eval, goal set at >5/10   COGNITION: Overall cognitive status: Within functional limits for tasks assessed     EDEMA:  Mild to moderate edema in Rt knee  LOWER EXTREMITY ROM:  Active ROM/PROM Right eval Left eval  Hip flexion    Hip extension    Hip abduction    Hip adduction    Hip internal rotation    Hip external rotation    Knee flexion AAROM 85 PROM 90   Knee extension AROM 7   Ankle dorsiflexion    Ankle plantarflexion    Ankle inversion    Ankle eversion     (Blank rows = not tested)  LOWER EXTREMITY MMT:  MMT tested in sitting Right eval Left eval  Hip flexion 4   Hip extension    Hip abduction 4   Hip adduction    Hip internal rotation    Hip external rotation    Knee flexion 4   Knee extension 4   Ankle dorsiflexion    Ankle plantarflexion    Ankle inversion    Ankle eversion     (Blank rows = not tested)  GAIT:EVAL Distance walked: 50 feet with quad cane, 50 feet without cane Arrives with cane, has difficulty with sequencing and is able to ambulate without it.  TODAY'S TREATMENT:  Eval HEP creation and review with demonstration and trial set preformed, see below for details Nu step L6 X 6 min UE/LE for knee ROM Manual therapy:Rt knee  PROM with overpressure flexion and extension with more emphasis on flexion Gait training:Gait with quad cane she arrives with, worked on proper sequence of holding cane in left hand and moving it along with Rt LE. She eventually could do this but preferred to use step to pattern and had increased difficulty with sequence when tried to progress to step through pattern. She is however able to ambulate without the cane so I did recommend she can just carry the cane and ambulate normal but she has it to put down for support if needed.     PATIENT EDUCATION: Education details: HEP, PT plan of care Person educated: Patient Education  method: Explanation, Demonstration, Verbal cues, and Handouts Education comprehension: verbalized understanding and needs further education   HOME EXERCISE PROGRAM: Access Code: [email or web address] URL: [email or web address] Date: 05/27/2023 Prepared by: Ivery Quale  Exercises - Supine Heel Slide with Strap  - 2 x daily - 6 x weekly - 1 sets - 10 reps - 5 hold - Supine Quadricep Sets  - 2 x daily - 6 x weekly - 1 sets - 10 reps - 5 sec hold - Seated Long Arc Quad  - 2 x daily - 6 x weekly - 2 sets - 10 reps - Seated Knee Flexion Stretch  - 2 x daily - 6 x weekly - 1 sets - 10 reps - 5 sec hold - Seated Hamstring Stretch  - 2 x daily - 6 x weekly - 1 sets - 3 reps - 30 sec hold - Sit to Stand  - 2 x daily - 6 x weekly - 1 sets - 10 reps  ASSESSMENT:  CLINICAL IMPRESSION: Patient referred to PT for Eval and Treat s/p right TKA 05/07/23. She has increased knee stiffness which will be early focus of PT. Other than that doing well up to this point with strength and ambulation but will also need work on this to improve function.  Patient will benefit from skilled PT to address below impairments, limitations and improve overall function.  OBJECTIVE IMPAIRMENTS: decreased activity tolerance, difficulty walking, decreased balance, decreased endurance, decreased mobility,  decreased ROM, decreased strength, impaired flexibility, impaired UE/LE use, and pain.  ACTIVITY LIMITATIONS: bending, lifting, carry, locomotion, cleaning, community activity, driving, and or occupation  PERSONAL FACTORS: see above PMH are also affecting patient's functional outcome.  REHAB POTENTIAL: Good  CLINICAL DECISION MAKING: Stable/uncomplicated  EVALUATION COMPLEXITY: Low    GOALS: Short term PT Goals Target date: 06/24/2023   Pt will be I and compliant with HEP. Baseline:  Goal status: New  Long term PT goals Target date:07/22/2023   Pt will improve ROM to [facility] to improve functional mobility Baseline: Goal status: New Pt will improve  hip/knee strength to at least 5-/5 MMT to improve functional strength Baseline: Goal status: New Pt will improve PSFS to >5/10 to show improved function with cooking and occupation Baseline: Goal status: New Pt will reduce pain to overall less than 2-3/10 with usual activity and work activity. Baseline: Goal status: New Pt will be able to ambulate community distances at least 58ft without AD and  WNL gait pattern without complaints Baseline: Goal status: New  PLAN: PT FREQUENCY: 2-3 times per week   PT DURATION: 6-8 weeks  PLANNED INTERVENTIONS (unless contraindicated): aquatic PT, Canalith repositioning, cryotherapy, Electrical stimulation, Iontophoresis with 4 mg/ml dexamethasome, Moist heat, traction, Ultrasound, gait training, Therapeutic exercise, balance training, neuromuscular re-education, patient/family education, manual techniques, passive ROM, dry needling, taping, vasopnuematic device, vestibular, spinal manipulations, joint manipulations 97110-Therapeutic exercises, 97530- Therapeutic activity, 97112- Neuromuscular re-education, 97535- Self Care, and [id number]- Manual therapy  PLAN FOR NEXT SESSION: knee ROM focus and gait without AD as able as she can do thiss ome  and has difficulty with cane  April Manson, PT,DPT 05/27/2023, 3:16 PM

## 2023-05-30 ENCOUNTER — Encounter: Payer: Self-pay | Admitting: Rehabilitative and Restorative Service Providers"

## 2023-05-30 ENCOUNTER — Ambulatory Visit (INDEPENDENT_AMBULATORY_CARE_PROVIDER_SITE_OTHER): Payer: 59 | Admitting: Rehabilitative and Restorative Service Providers"

## 2023-05-30 DIAGNOSIS — R262 Difficulty in walking, not elsewhere classified: Secondary | ICD-10-CM | POA: Diagnosis not present

## 2023-05-30 DIAGNOSIS — M25561 Pain in right knee: Secondary | ICD-10-CM

## 2023-05-30 DIAGNOSIS — R600 Localized edema: Secondary | ICD-10-CM

## 2023-05-30 DIAGNOSIS — M25661 Stiffness of right knee, not elsewhere classified: Secondary | ICD-10-CM | POA: Diagnosis not present

## 2023-05-30 DIAGNOSIS — M6281 Muscle weakness (generalized): Secondary | ICD-10-CM

## 2023-05-30 NOTE — Therapy (Signed)
 OUTPATIENT PHYSICAL THERAPY TREATMENT   Patient Name: Morgan Swanson MRN: [id number] DOB:[birth year], 73 y.o., female Today's Date: 05/30/2023  END OF SESSION:  PT End of Session - 05/30/23 0855     Visit Number 2    Number of Visits 18    Date for PT Re-Evaluation 07/22/23    Authorization Type [employer] State and Medicare    Progress Note Due on Visit 10    PT Start Time 0845    PT Stop Time 0934    PT Time Calculation (min) 49 min    Activity Tolerance Patient tolerated treatment well    Behavior During Therapy WFL for tasks assessed/performed              Past Medical History:  Diagnosis Date   Anxiety    Arthritis    back    Back pain    Cataract    small immature   Depression    GERD (gastroesophageal reflux disease)    Hyperlipidemia    Hypertension    Spinal stenosis, lumbar region, without neurogenic claudication    Past Surgical History:  Procedure Laterality Date   30-Day Event Monitor  08/2016   15 events noted. Mostly sinus rhythm with occasional bradycardia. Low rate 50. Average rate 70 bpm. Rare PACs but no runs or couplets. No arrhythmia noted.   CHOLECYSTECTOMY     HAND SURGERY     fatty tissue removed    TOTAL KNEE ARTHROPLASTY Right 05/07/2023   Procedure: RIGHT TOTAL KNEE ARTHROPLASTY;  Surgeon: Kathryne Hitch, MD;  Location: [facility] OR;  Service: Orthopedics;  Laterality: Right;   TRANSTHORACIC ECHOCARDIOGRAM  12/2001; 09/2016   a.Normal LV size and function. EF 55-65%. Normal regional wall motion. No valvular lesions;; b. October 04, 2016: Normal LV size with mild LV hypertrophy. EF 55-60%. Mildly increased RV size with normal systolic function. No significant valvular abnormalities   VAGINAL HYSTERECTOMY  1983   Zio Patch Monitor  08/2021   Mostly sinus rhythm, occasional (3.3%) isolated PACs.  Multiple (157) runs of PAT: Fastest- 5&6 beats, max 184 bpm; longest 16 beats (7.7 Sec) avg rate 128 bpm; symptoms mostly noted with sinus rhythm and  PACs.  137 triggers most were not SVT/PAT episodes.   Patient Active Problem List   Diagnosis Date Noted   Status post total right knee replacement 05/07/2023   Lumbar radiculopathy 03/15/2021   Chronic back pain 07/09/2017   Essential hypertension 09/19/2016   Heart palpitations 09/19/2016   Systolic murmur 09/19/2016   Chronic bilateral low back pain with sciatica 09/10/2016   Lumbosacral root lesions 01/25/2014   Spinal stenosis, lumbar region, without neurogenic claudication 07/21/2012   Encounter for long-term (current) use of other medications 07/21/2012   Lumbosacral root lesions, not elsewhere classified 07/21/2012    PCP: Dot Been, FNP   REFERRING PROVIDER: Kathryne Hitch*   REFERRING DIAG: [phone] (ICD-10-CM) - Status post total right knee replacement  THERAPY DIAG:  Acute pain of right knee  Stiffness of right knee, not elsewhere classified  Muscle weakness (generalized)  Difficulty in walking, not elsewhere classified  Localized edema  Rationale for Evaluation and Treatment: Rehabilitation  ONSET DATE: s/p right TKA 05/07/23  SUBJECTIVE:   SUBJECTIVE STATEMENT: Pt indicated doing more walking without the cane.  Reported 1/10 upon arrival today.  Pt reported pain with bending.   PERTINENT HISTORY: Post op status, sciatica and reported nerve damage in right foot PAIN:  NPRS scale: 1/10  Pain location:Rt  knee Pain description: constant, achy, sharp Aggravating factors: stairs, housework, sleeping Relieving factors: meds   PRECAUTIONS: None  RED FLAGS: None   WEIGHT BEARING RESTRICTIONS: No  FALLS:  Has patient fallen in last 6 months? 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  DIAGNOSTIC FINDINGS: 05/07/23 XR  IMPRESSION: Right knee arthroplasty without immediate postoperative complication.  PATIENT SURVEYS:  Patient-Specific Activity Scoring Scheme  "0" represents "unable to perform." "10" represents "able to perform at prior level. 0 1 2 3 4 5 6 7 8 9  10 (Date and Score)   Activity 05/27/2023    1. cooking  1    2.       3.     4.    5.     Total score = sum of the activity scores/number of activities Minimum detectable change (90%CI) for average score = 2 points Minimum detectable change (90%CI) for single activity score = 3 points   Score: 1/10 at eval, goal set at >5/10   COGNITION: 05/27/2023 Overall cognitive status: Within functional limits for tasks assessed     EDEMA:  05/27/2023 Mild to moderate edema in Rt knee  LOWER EXTREMITY ROM:  Active ROM/PROM Right 05/27/2023 Left eval  Hip flexion    Hip extension    Hip abduction    Hip adduction    Hip internal rotation    Hip external rotation    Knee flexion AAROM 85 PROM 90   Knee extension AROM 7   Ankle dorsiflexion    Ankle plantarflexion    Ankle inversion    Ankle eversion     (Blank rows = not tested)  LOWER EXTREMITY MMT:  MMT tested in sitting Right 05/27/2023 Left eval  Hip flexion 4   Hip extension    Hip abduction 4   Hip adduction    Hip internal rotation    Hip external rotation    Knee flexion 4   Knee extension 4   Ankle dorsiflexion    Ankle plantarflexion    Ankle inversion    Ankle eversion     (Blank rows = not tested)  GAIT: 05/27/2023 EVAL Distance walked: 50 feet with quad cane, 50 feet without cane Arrives with cane, has difficulty with sequencing and is able to ambulate without it.              TODAY'S TREATMENT:                                                                       DATE: 05/30/2023 Therex: Nustep Lvl 6 8 mins UE/LE for ROM Seated Rt leg LAQ end range pause 1-2 sec each way 2 x 15 Supine heel slide Rt leg AROM 5 sec hold x 10    Neuro  Re-ed Tandem stance 1 min x 1 bilateral with occasional HHA on bar, SBA Alt toe tapping slowly 4 inch step 2 x 10 bilateral in // bars Alt heel /toe lift in // bars x15 with occasional HHA with focus on improving ankle  strategy for balance.  Supine quad set Rt leg 5 sec hold x 10 for neural recruitment/activation   Manual: Seated Rt knee flexion c IR/distraction mobilization with movement.  Contract/relax for knee flexion Rt.   Vasopneumatic  10 mins Rt leg 34 deg  medium compression in elevation     TODAY'S TREATMENT:                                                                       DATE: 05/27/2023 Eval HEP creation and review with demonstration and trial set preformed, see below for details Nu step L6 X 6 min UE/LE for knee ROM Manual therapy:Rt knee PROM with overpressure flexion and extension with more emphasis on flexion Gait training:Gait with quad cane she arrives with, worked on proper sequence of holding cane in left hand and moving it along with Rt LE. She eventually could do this but preferred to use step to pattern and had increased difficulty with sequence when tried to progress to step through pattern. She is however able to ambulate without the cane so I did recommend she can just carry the cane and ambulate normal but she has it to put down for support if needed.     PATIENT EDUCATION: Education details: HEP, PT plan of care Person educated: Patient Education method: Explanation, Demonstration, Verbal cues, and Handouts Education comprehension: verbalized understanding and needs further education   HOME EXERCISE PROGRAM: Access Code: [email or web address] URL: [email or web address] Date: 05/27/2023 Prepared by: Ivery Quale  Exercises - Supine Heel Slide with Strap  - 2 x daily - 6 x weekly - 1 sets - 10 reps - 5 hold - Supine Quadricep Sets  - 2 x daily - 6 x weekly - 1 sets - 10 reps - 5 sec hold - Seated Long Arc Quad  - 2 x daily - 6 x weekly - 2 sets  - 10 reps - Seated Knee Flexion Stretch  - 2 x daily - 6 x weekly - 1 sets - 10 reps - 5 sec hold - Seated Hamstring Stretch  - 2 x daily - 6 x weekly - 1 sets - 3 reps - 30 sec hold - Sit to Stand  - 2 x daily - 6 x weekly - 1 sets - 10 reps  ASSESSMENT:  CLINICAL IMPRESSION:  Independent ambulation was safe but some deviation was noted in stance, gait cycle.  Flexion tightness/pain symptoms still evident.  Continued skilled PT services indicated at this time to continue to make progress towards PLOF.   OBJECTIVE IMPAIRMENTS: decreased activity tolerance, difficulty walking, decreased balance, decreased endurance, decreased mobility, decreased ROM, decreased strength, impaired flexibility, impaired UE/LE use, and pain.  ACTIVITY LIMITATIONS: bending, lifting, carry, locomotion, cleaning, community activity, driving, and or occupation  PERSONAL FACTORS: see above PMH are also affecting patient's functional outcome.  REHAB POTENTIAL: Good  CLINICAL DECISION MAKING: Stable/uncomplicated  EVALUATION COMPLEXITY: Low    GOALS: Short term PT Goals Target date: 06/24/2023   Pt will be I and compliant with HEP. Baseline:  Goal status: on going 05/30/2023  Long term PT goals Target date:07/22/2023   Pt will improve ROM to [facility] to improve functional mobility Baseline: Goal status: New Pt will improve  hip/knee strength to at  least 5-/5 MMT to improve functional strength Baseline: Goal status: New Pt will improve PSFS to >5/10 to show improved function with cooking and occupation Baseline: Goal status: New Pt will reduce pain to overall less than 2-3/10 with usual activity and work activity. Baseline: Goal status: New Pt will be able to ambulate community distances at least 519ft without AD and  WNL gait pattern without complaints Baseline: Goal status: New  PLAN: PT FREQUENCY: 2-3 times per week   PT DURATION: 6-8 weeks  PLANNED INTERVENTIONS (unless contraindicated):  aquatic PT, Canalith repositioning, cryotherapy, Electrical stimulation, Iontophoresis with 4 mg/ml dexamethasome, Moist heat, traction, Ultrasound, gait training, Therapeutic exercise, balance training, neuromuscular re-education, patient/family education, manual techniques, passive ROM, dry needling, taping, vasopnuematic device, vestibular, spinal manipulations, joint manipulations 97110-Therapeutic exercises, 97530- Therapeutic activity, [id number]- Neuromuscular re-education, 97535- Self Care, and 91478- Manual therapy  PLAN FOR NEXT SESSION: Strengthening, balance improvements.  Knee flexion gains.                                                                                                                                Chyrel Masson, PT, DPT, OCS, ATC 05/30/23  9:25 AM

## 2023-06-05 ENCOUNTER — Encounter: Payer: Self-pay | Admitting: Rehabilitative and Restorative Service Providers"

## 2023-06-05 ENCOUNTER — Ambulatory Visit (INDEPENDENT_AMBULATORY_CARE_PROVIDER_SITE_OTHER): Payer: 59 | Admitting: Rehabilitative and Restorative Service Providers"

## 2023-06-05 DIAGNOSIS — M6281 Muscle weakness (generalized): Secondary | ICD-10-CM | POA: Diagnosis not present

## 2023-06-05 DIAGNOSIS — R262 Difficulty in walking, not elsewhere classified: Secondary | ICD-10-CM

## 2023-06-05 DIAGNOSIS — R600 Localized edema: Secondary | ICD-10-CM

## 2023-06-05 DIAGNOSIS — M25661 Stiffness of right knee, not elsewhere classified: Secondary | ICD-10-CM

## 2023-06-05 DIAGNOSIS — M25561 Pain in right knee: Secondary | ICD-10-CM | POA: Diagnosis not present

## 2023-06-05 NOTE — Therapy (Signed)
 OUTPATIENT PHYSICAL THERAPY TREATMENT   Patient Name: Morgan Swanson MRN: [id number] DOB:08/29/1950, 73 y.o., female Today's Date: 06/05/2023  END OF SESSION:  PT End of Session - 06/05/23 1055     Visit Number 3    Number of Visits 18    Date for PT Re-Evaluation 07/22/23    Authorization Type [employer] State and Medicare    Progress Note Due on Visit 10    PT Start Time 1055    PT Stop Time 1149    PT Time Calculation (min) 54 min    Activity Tolerance Patient tolerated treatment well;No increased pain;Patient limited by pain    Behavior During Therapy [facility] for tasks assessed/performed             Past Medical History:  Diagnosis Date   Anxiety    Arthritis    back    Back pain    Cataract    small immature   Depression    GERD (gastroesophageal reflux disease)    Hyperlipidemia    Hypertension    Spinal stenosis, lumbar region, without neurogenic claudication    Past Surgical History:  Procedure Laterality Date   30-Day Event Monitor  08/2016   15 events noted. Mostly sinus rhythm with occasional bradycardia. Low rate 50. Average rate 70 bpm. Rare PACs but no runs or couplets. No arrhythmia noted.   CHOLECYSTECTOMY     HAND SURGERY     fatty tissue removed    TOTAL KNEE ARTHROPLASTY Right 05/07/2023   Procedure: RIGHT TOTAL KNEE ARTHROPLASTY;  Surgeon: Kathryne Hitch, MD;  Location: [facility] OR;  Service: Orthopedics;  Laterality: Right;   TRANSTHORACIC ECHOCARDIOGRAM  12/2001; 09/2016   a.Normal LV size and function. EF 55-65%. Normal regional wall motion. No valvular lesions;; b. October 04, 2016: Normal LV size with mild LV hypertrophy. EF 55-60%. Mildly increased RV size with normal systolic function. No significant valvular abnormalities   VAGINAL HYSTERECTOMY  1983   Zio Patch Monitor  08/2021   Mostly sinus rhythm, occasional (3.3%) isolated PACs.  Multiple (157) runs of PAT: Fastest- 5&6 beats, max 184 bpm; longest 16 beats (7.7 Sec) avg rate 128 bpm;  symptoms mostly noted with sinus rhythm and PACs.  137 triggers most were not SVT/PAT episodes.   Patient Active Problem List   Diagnosis Date Noted   Status post total right knee replacement 05/07/2023   Lumbar radiculopathy 03/15/2021   Chronic back pain 07/09/2017   Essential hypertension 09/19/2016   Heart palpitations 09/19/2016   Systolic murmur 09/19/2016   Chronic bilateral low back pain with sciatica 09/10/2016   Lumbosacral root lesions 01/25/2014   Spinal stenosis, lumbar region, without neurogenic claudication 07/21/2012   Encounter for long-term (current) use of other medications 07/21/2012   Lumbosacral root lesions, not elsewhere classified 07/21/2012    PCP: Dot Been, FNP   REFERRING PROVIDER: Dot Been, FNP   REFERRING DIAG: [phone] (ICD-10-CM) - Status post total right knee replacement  THERAPY DIAG:  Acute pain of right knee  Stiffness of right knee, not elsewhere classified  Muscle weakness (generalized)  Difficulty in walking, not elsewhere classified  Localized edema  Rationale for Evaluation and Treatment: Rehabilitation  ONSET DATE: s/p right TKA 05/07/23  SUBJECTIVE:   SUBJECTIVE STATEMENT: [name] is taking 1 oxycodone a day.  Sleep is "pretty normal."  She reports good HEP compliance.  PERTINENT HISTORY: Post op status, sciatica and reported nerve damage in right foot PAIN:  NPRS scale: 2-5/10 this  week Pain location: Rt knee Pain description: achy, sharp Aggravating factors: stairs, housework Relieving factors: prescription meds   PRECAUTIONS: None  RED FLAGS: None   WEIGHT BEARING RESTRICTIONS: No  FALLS:  Has patient fallen in last 6 months? 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  DIAGNOSTIC FINDINGS: 05/07/23 XR IMPRESSION: Right knee arthroplasty without immediate postoperative complication.  PATIENT SURVEYS:  Patient-Specific Activity Scoring Scheme  "0" represents "unable to perform." "10" represents "able to perform at prior level. 0 1 2 3 4 5 6 7 8 9  10 (Date and Score)   Activity 05/27/2023    1. cooking  1    2.       3.     4.    5.     Total score = sum of the activity scores/number of activities Minimum detectable change (90%CI) for average score = 2 points Minimum detectable change (90%CI) for single activity score = 3 points   Score: 1/10 at eval, goal set at >5/10   COGNITION: 05/27/2023 Overall cognitive status: Within functional limits for tasks assessed     EDEMA:  05/27/2023 Mild to moderate edema in Rt knee  LOWER EXTREMITY ROM:  Active ROM/PROM Right 05/27/2023 Right 06/05/2023  Hip flexion    Hip extension    Hip abduction    Hip adduction    Hip internal rotation    Hip external rotation    Knee flexion AAROM 85 PROM 90 98 with belt  Knee extension AROM 7 5 Active  Ankle dorsiflexion    Ankle plantarflexion    Ankle inversion    Ankle eversion     (Blank rows = not tested)  LOWER EXTREMITY MMT:  MMT tested in sitting Right 05/27/2023 Left eval  Hip flexion 4   Hip extension    Hip abduction 4   Hip adduction    Hip internal rotation    Hip external rotation    Knee flexion 4   Knee extension 4   Ankle dorsiflexion    Ankle plantarflexion    Ankle inversion    Ankle eversion     (Blank rows = not tested)  GAIT: 05/27/2023 EVAL Distance walked: 50 feet with quad cane, 50 feet without cane Arrives with cane, has difficulty with sequencing and is able to ambulate without it.              TODAY'S TREATMENT:                                                                       DATE:  06/05/2023 Recumbent bike for AAROM (not able to pedal) Seat 5 for 8 minutes Seated AAROM knee flexion (left  pushes right into flexion) 10 x 10 seconds Supine knee flexion with strap with quadriceps set in between reps 10 x 5 seconds each with PT assistance Quadriceps sets with right heel prop 10 x 5 seconds  Functional Activities (stairs, sit to stand): Double Leg Press 75# 15 x full extension and stretch slowly into flexion  Single leg Press 25# 10 x full extension and stretch slowly into flexion  Vaso right knee 10 minutes Medium Pressure 34*   05/30/2023 Therex: Nustep Lvl 6 8 mins UE/LE for ROM Seated Rt leg LAQ end range pause 1-2 sec each way 2 x 15 Supine heel slide Rt leg AROM 5 sec hold x 10   Neuro Re-ed Tandem stance 1 min x 1 bilateral with occasional HHA on bar, SBA Alt toe tapping slowly 4 inch step 2 x 10 bilateral in // bars Alt heel /toe lift in // bars x15 with occasional HHA with focus on improving ankle strategy for balance.  Supine quad set Rt leg 5 sec hold x 10 for neural recruitment/activation  Manual: Seated Rt knee flexion c IR/distraction mobilization with movement.  Contract/relax for knee flexion Rt.   Vasopneumatic  10 mins Rt leg 34 deg  medium compression in elevation    05/27/2023 Eval HEP creation and review with demonstration and trial set preformed, see below for details Nu step L6 X 6 min UE/LE for knee ROM Manual therapy:Rt knee PROM with overpressure flexion and extension with more emphasis on flexion Gait training:Gait with quad cane she arrives with, worked on proper sequence of holding cane in left hand and moving it along with Rt LE. She eventually could do this but preferred to use step to pattern and had increased difficulty with sequence when tried to progress to step through pattern. She is however able to ambulate without the cane so I did recommend she can just carry the cane and ambulate normal but she has it to put down for support if needed.    PATIENT EDUCATION: Education details: HEP, PT plan of care Person educated:  Patient Education method: Explanation, Demonstration, Verbal cues, and Handouts Education comprehension: verbalized understanding and needs further education   HOME EXERCISE PROGRAM: Access Code: [email or web address] URL: [email or web address] Date: 06/05/2023 Prepared by: Pauletta Browns  Exercises - Supine Heel Slide with Strap  - 2 x daily - 6 x weekly - 1 sets - 10 reps - 5 hold - Supine Quadricep Sets  - 2 x daily - 6 x weekly - 1 sets - 10 reps - 5 sec hold - Seated Long Arc Quad  - 2 x daily - 6 x weekly - 2 sets - 10 reps - Seated Knee Flexion Stretch  - 2 x daily - 6 x weekly - 1 sets - 10 reps - 5 sec hold - Seated Hamstring Stretch  - 2 x daily - 6 x weekly - 1 sets - 3 reps - 30 sec hold - Sit to Stand  - 2 x daily - 6 x weekly - 1 sets - 10 reps - Seated Knee Flexion AAROM  - 5 x daily - 7 x weekly - 1 sets - 1 reps - 3 minutes hold  ASSESSMENT:  CLINICAL IMPRESSION:  AROM at 5 - 0 - 98 degrees today with the strap assisting for flexion.  Carlota was encouraged to get 100 quadriceps sets per day with her HEP for extension AROM, edema control and quadriceps strength along with 50 of some type of knee flexion exercise.  Her prognosis is good to meet the below listed goals.  OBJECTIVE IMPAIRMENTS: decreased activity tolerance, difficulty walking, decreased balance, decreased endurance, decreased mobility, decreased ROM, decreased strength, impaired flexibility, impaired UE/LE use, and pain.  ACTIVITY LIMITATIONS: bending, lifting, carry, locomotion, cleaning, community activity, driving, and or occupation  PERSONAL FACTORS: see above PMH are also  affecting patient's functional outcome.  REHAB POTENTIAL: Good  CLINICAL DECISION MAKING: Stable/uncomplicated  EVALUATION COMPLEXITY: Low    GOALS: Short term PT Goals Target date: 06/24/2023   Pt will be I and compliant with HEP. Baseline:  Goal status: On Going 06/05/2023  Long term PT goals Target  date:07/22/2023   Pt will improve ROM to [facility] to improve functional mobility Baseline: Goal status: New Pt will improve  hip/knee strength to at least 5-/5 MMT to improve functional strength Baseline: Goal status: New Pt will improve PSFS to >5/10 to show improved function with cooking and occupation Baseline: Goal status: New Pt will reduce pain to overall less than 2-3/10 with usual activity and work activity. Baseline: Goal status: New Pt will be able to ambulate community distances at least 551ft without AD and  WNL gait pattern without complaints Baseline: Goal status: New  PLAN: PT FREQUENCY: 2-3 times per week   PT DURATION: 6-8 weeks  PLANNED INTERVENTIONS (unless contraindicated): aquatic PT, Canalith repositioning, cryotherapy, Electrical stimulation, Iontophoresis with 4 mg/ml dexamethasome, Moist heat, traction, Ultrasound, gait training, Therapeutic exercise, balance training, neuromuscular re-education, patient/family education, manual techniques, passive ROM, dry needling, taping, vasopnuematic device, vestibular, spinal manipulations, joint manipulations 97110-Therapeutic exercises, 97530- Therapeutic activity, [id number]- Neuromuscular re-education, 97535- Self Care, and 04540- Manual therapy  PLAN FOR NEXT SESSION: Strengthening, balance improvements.  Knee flexion gains.                                                                                                                                Cherlyn Cushing PT, MPT 06/05/23  11:43 AM

## 2023-06-07 ENCOUNTER — Encounter: Payer: Self-pay | Admitting: Physical Therapy

## 2023-06-07 ENCOUNTER — Ambulatory Visit: Payer: 59 | Admitting: Physical Therapy

## 2023-06-07 DIAGNOSIS — R600 Localized edema: Secondary | ICD-10-CM

## 2023-06-07 DIAGNOSIS — M25661 Stiffness of right knee, not elsewhere classified: Secondary | ICD-10-CM

## 2023-06-07 DIAGNOSIS — M25561 Pain in right knee: Secondary | ICD-10-CM | POA: Diagnosis not present

## 2023-06-07 DIAGNOSIS — R262 Difficulty in walking, not elsewhere classified: Secondary | ICD-10-CM

## 2023-06-07 DIAGNOSIS — M6281 Muscle weakness (generalized): Secondary | ICD-10-CM

## 2023-06-07 NOTE — Therapy (Signed)
 OUTPATIENT PHYSICAL THERAPY TREATMENT   Patient Name: Morgan Swanson MRN: [id number] DOB:1950/08/10, 73 y.o., female Today's Date: 06/07/2023  END OF SESSION:  PT End of Session - 06/07/23 1208     Visit Number 4    Number of Visits 18    Date for PT Re-Evaluation 07/22/23    Authorization Type [employer] State and Medicare    Progress Note Due on Visit 10    PT Start Time 1147    PT Stop Time 1226    PT Time Calculation (min) 39 min    Activity Tolerance Patient tolerated treatment well;No increased pain    Behavior During Therapy WFL for tasks assessed/performed              Past Medical History:  Diagnosis Date   Anxiety    Arthritis    back    Back pain    Cataract    small immature   Depression    GERD (gastroesophageal reflux disease)    Hyperlipidemia    Hypertension    Spinal stenosis, lumbar region, without neurogenic claudication    Past Surgical History:  Procedure Laterality Date   30-Day Event Monitor  08/2016   15 events noted. Mostly sinus rhythm with occasional bradycardia. Low rate 50. Average rate 70 bpm. Rare PACs but no runs or couplets. No arrhythmia noted.   CHOLECYSTECTOMY     HAND SURGERY     fatty tissue removed    TOTAL KNEE ARTHROPLASTY Right 05/07/2023   Procedure: RIGHT TOTAL KNEE ARTHROPLASTY;  Surgeon: Kathryne Hitch, MD;  Location: [facility] OR;  Service: Orthopedics;  Laterality: Right;   TRANSTHORACIC ECHOCARDIOGRAM  12/2001; 09/2016   a.Normal LV size and function. EF 55-65%. Normal regional wall motion. No valvular lesions;; b. October 04, 2016: Normal LV size with mild LV hypertrophy. EF 55-60%. Mildly increased RV size with normal systolic function. No significant valvular abnormalities   VAGINAL HYSTERECTOMY  1983   Zio Patch Monitor  08/2021   Mostly sinus rhythm, occasional (3.3%) isolated PACs.  Multiple (157) runs of PAT: Fastest- 5&6 beats, max 184 bpm; longest 16 beats (7.7 Sec) avg rate 128 bpm; symptoms mostly noted with  sinus rhythm and PACs.  137 triggers most were not SVT/PAT episodes.   Patient Active Problem List   Diagnosis Date Noted   Status post total right knee replacement 05/07/2023   Lumbar radiculopathy 03/15/2021   Chronic back pain 07/09/2017   Essential hypertension 09/19/2016   Heart palpitations 09/19/2016   Systolic murmur 09/19/2016   Chronic bilateral low back pain with sciatica 09/10/2016   Lumbosacral root lesions 01/25/2014   Spinal stenosis, lumbar region, without neurogenic claudication 07/21/2012   Encounter for long-term (current) use of other medications 07/21/2012   Lumbosacral root lesions, not elsewhere classified 07/21/2012    PCP: Dot Been, FNP   REFERRING PROVIDER: Kathryne Hitch*   REFERRING DIAG: [phone] (ICD-10-CM) - Status post total right knee replacement  THERAPY DIAG:  Acute pain of right knee  Stiffness of right knee, not elsewhere classified  Difficulty in walking, not elsewhere classified  Localized edema  Muscle weakness (generalized)  Rationale for Evaluation and Treatment: Rehabilitation  ONSET DATE: s/p right TKA 05/07/23  SUBJECTIVE:   SUBJECTIVE STATEMENT:  Feeling better, nothing much else is new. I'm bored ready to go back to work   PERTINENT HISTORY: Post op status, sciatica and reported nerve damage in right foot PAIN:  NPRS scale: 4/10 Pain location: Rt knee Pain  description: achy, sharp, zinging feeling like its healing  Aggravating factors: stairs, housework Relieving factors: prescription meds   PRECAUTIONS: None  RED FLAGS: None   WEIGHT BEARING RESTRICTIONS: No  FALLS:  Has patient fallen in last 6 months? 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  DIAGNOSTIC FINDINGS:  05/07/23 XR IMPRESSION: Right knee arthroplasty without immediate postoperative complication.  PATIENT SURVEYS:  Patient-Specific Activity Scoring Scheme  "0" represents "unable to perform." "10" represents "able to perform at prior level. 0 1 2 3 4 5 6 7 8 9  10 (Date and Score)   Activity 05/27/2023    1. cooking  1    2.       3.     4.    5.     Total score = sum of the activity scores/number of activities Minimum detectable change (90%CI) for average score = 2 points Minimum detectable change (90%CI) for single activity score = 3 points   Score: 1/10 at eval, goal set at >5/10   COGNITION: 05/27/2023 Overall cognitive status: Within functional limits for tasks assessed     EDEMA:  05/27/2023 Mild to moderate edema in Rt knee  LOWER EXTREMITY ROM:  Active ROM/PROM Right 05/27/2023 Right 06/05/2023  Hip flexion    Hip extension    Hip abduction    Hip adduction    Hip internal rotation    Hip external rotation    Knee flexion AAROM 85 PROM 90 98 with belt  Knee extension AROM 7 5 Active  Ankle dorsiflexion    Ankle plantarflexion    Ankle inversion    Ankle eversion     (Blank rows = not tested)  LOWER EXTREMITY MMT:  MMT tested in sitting Right 05/27/2023 Left eval  Hip flexion 4   Hip extension    Hip abduction 4   Hip adduction    Hip internal rotation    Hip external rotation    Knee flexion 4   Knee extension 4   Ankle dorsiflexion    Ankle plantarflexion    Ankle inversion    Ankle eversion     (Blank rows = not tested)  GAIT: 05/27/2023 EVAL Distance walked: 50 feet with quad cane, 50 feet without cane Arrives with cane, has difficulty with sequencing and is able to ambulate without it.              TODAY'S TREATMENT:                                                                       DATE:    06/07/23   Scifit bike Seat 7 partial rotations x10 minutes    Knee flexion AAROM 15x5 second holds  LAQs 2# 2x15 R LE  STS from  chair L LE staggered forward/R back x12   Tennis ball massage to quad in supine with LEs elevated Knee flexion OP seated          06/05/2023 Recumbent bike for AAROM (not able to pedal) Seat 5 for 8  minutes Seated AAROM knee flexion (left pushes right into flexion) 10 x 10 seconds Supine knee flexion with strap with quadriceps set in between reps 10 x 5 seconds each with PT assistance Quadriceps sets with right heel prop 10 x 5 seconds  Functional Activities (stairs, sit to stand): Double Leg Press 75# 15 x full extension and stretch slowly into flexion  Single leg Press 25# 10 x full extension and stretch slowly into flexion  Vaso right knee 10 minutes Medium Pressure 34*   05/30/2023 Therex: Nustep Lvl 6 8 mins UE/LE for ROM Seated Rt leg LAQ end range pause 1-2 sec each way 2 x 15 Supine heel slide Rt leg AROM 5 sec hold x 10   Neuro Re-ed Tandem stance 1 min x 1 bilateral with occasional HHA on bar, SBA Alt toe tapping slowly 4 inch step 2 x 10 bilateral in // bars Alt heel /toe lift in // bars x15 with occasional HHA with focus on improving ankle strategy for balance.  Supine quad set Rt leg 5 sec hold x 10 for neural recruitment/activation  Manual: Seated Rt knee flexion c IR/distraction mobilization with movement.  Contract/relax for knee flexion Rt.   Vasopneumatic  10 mins Rt leg 34 deg  medium compression in elevation    05/27/2023 Eval HEP creation and review with demonstration and trial set preformed, see below for details Nu step L6 X 6 min UE/LE for knee ROM Manual therapy:Rt knee PROM with overpressure flexion and extension with more emphasis on flexion Gait training:Gait with quad cane she arrives with, worked on proper sequence of holding cane in left hand and moving it along with Rt LE. She eventually could do this but preferred to use step to pattern and had increased difficulty with sequence when tried to progress to step through pattern. She is  however able to ambulate without the cane so I did recommend she can just carry the cane and ambulate normal but she has it to put down for support if needed.    PATIENT EDUCATION: Education details: HEP, PT plan of care Person educated: Patient Education method: Explanation, Demonstration, Verbal cues, and Handouts Education comprehension: verbalized understanding and needs further education   HOME EXERCISE PROGRAM: Access Code: [email or web address] URL: [email or web address] Date: 06/05/2023 Prepared by: Pauletta Browns  Exercises - Supine Heel Slide with Strap  - 2 x daily - 6 x weekly - 1 sets - 10 reps - 5 hold - Supine Quadricep Sets  - 2 x daily - 6 x weekly - 1 sets - 10 reps - 5 sec hold - Seated Long Arc Quad  - 2 x daily - 6 x weekly - 2 sets - 10 reps - Seated Knee Flexion Stretch  - 2 x daily - 6 x weekly - 1 sets - 10 reps - 5 sec hold - Seated Hamstring Stretch  - 2 x daily - 6 x weekly - 1 sets - 3 reps - 30 sec hold - Sit to Stand  - 2 x daily - 6 x weekly - 1 sets - 10 reps - Seated Knee Flexion AAROM  - 5 x daily - 7 x weekly - 1 sets - 1 reps - 3 minutes hold  ASSESSMENT:  CLINICAL IMPRESSION:    Pt arrives today doing well, she is starting to feel a lot better after her surgery. We continued heavy focus on ROM based interventions today, also worked on some manual to reduce mm spasms and guarding. Continued to incorporate  strengthening as well. Encouraged her that she is still only 4 weeks out from surgery and limitations in ROM/gait and pain levels are still as expected.   OBJECTIVE IMPAIRMENTS: decreased activity tolerance, difficulty walking, decreased balance, decreased endurance, decreased mobility, decreased ROM, decreased strength, impaired flexibility, impaired UE/LE use, and pain.  ACTIVITY LIMITATIONS: bending, lifting, carry, locomotion, cleaning, community activity, driving, and or occupation  PERSONAL FACTORS: see above PMH are also affecting  patient's functional outcome.  REHAB POTENTIAL: Good  CLINICAL DECISION MAKING: Stable/uncomplicated  EVALUATION COMPLEXITY: Low    GOALS: Short term PT Goals Target date: 06/24/2023   Pt will be I and compliant with HEP. Baseline:  Goal status: On Going 06/05/2023  Long term PT goals Target date:07/22/2023   Pt will improve ROM to [facility] to improve functional mobility Baseline: Goal status: New Pt will improve  hip/knee strength to at least 5-/5 MMT to improve functional strength Baseline: Goal status: New Pt will improve PSFS to >5/10 to show improved function with cooking and occupation Baseline: Goal status: New Pt will reduce pain to overall less than 2-3/10 with usual activity and work activity. Baseline: Goal status: New Pt will be able to ambulate community distances at least 559ft without AD and  WNL gait pattern without complaints Baseline: Goal status: New  PLAN: PT FREQUENCY: 2-3 times per week   PT DURATION: 6-8 weeks  PLANNED INTERVENTIONS (unless contraindicated): aquatic PT, Canalith repositioning, cryotherapy, Electrical stimulation, Iontophoresis with 4 mg/ml dexamethasome, Moist heat, traction, Ultrasound, gait training, Therapeutic exercise, balance training, neuromuscular re-education, patient/family education, manual techniques, passive ROM, dry needling, taping, vasopnuematic device, vestibular, spinal manipulations, joint manipulations 97110-Therapeutic exercises, 97530- Therapeutic activity, [id number]- Neuromuscular re-education, 97535- Self Care, and [id number]- Manual therapy  PLAN FOR NEXT SESSION: Strengthening, balance improvements.  Knee flexion gains. Manual as desired                                              Nedra Hai, PT, DPT 06/07/23 12:26 PM

## 2023-06-11 ENCOUNTER — Telehealth: Payer: Self-pay | Admitting: Physical Therapy

## 2023-06-11 ENCOUNTER — Encounter: Payer: 59 | Admitting: Physical Therapy

## 2023-06-11 ENCOUNTER — Encounter: Payer: Self-pay | Admitting: Physical Therapy

## 2023-06-11 NOTE — Telephone Encounter (Signed)
 Pt did not show for PT appointment today. They were contacted and informed of this. She states she forgot about her appointment and she is felling sick today. I let her know she is scheduled to come back in Thursday and that if she is still feeling sick then to call us to let us so we know so we can cancel the appointment for her.  Ivery Quale, PT, DPT 06/11/23 3:14 PM

## 2023-06-13 ENCOUNTER — Encounter: Payer: Self-pay | Admitting: Physical Therapy

## 2023-06-13 ENCOUNTER — Ambulatory Visit (INDEPENDENT_AMBULATORY_CARE_PROVIDER_SITE_OTHER): Payer: 59 | Admitting: Physical Therapy

## 2023-06-13 DIAGNOSIS — R600 Localized edema: Secondary | ICD-10-CM

## 2023-06-13 DIAGNOSIS — R262 Difficulty in walking, not elsewhere classified: Secondary | ICD-10-CM | POA: Diagnosis not present

## 2023-06-13 DIAGNOSIS — M25561 Pain in right knee: Secondary | ICD-10-CM | POA: Diagnosis not present

## 2023-06-13 DIAGNOSIS — M25661 Stiffness of right knee, not elsewhere classified: Secondary | ICD-10-CM

## 2023-06-13 DIAGNOSIS — M6281 Muscle weakness (generalized): Secondary | ICD-10-CM

## 2023-06-13 NOTE — Therapy (Signed)
 OUTPATIENT PHYSICAL THERAPY TREATMENT   Patient Name: Morgan Swanson MRN: [id number] DOB:1951-03-05, 73 y.o., female Today's Date: 06/13/2023  END OF SESSION:  PT End of Session - 06/13/23 1434     Visit Number 5    Number of Visits 18    Date for PT Re-Evaluation 07/22/23    Authorization Type [employer] State and Medicare    Progress Note Due on Visit 10    PT Start Time 1424    PT Stop Time 1502    PT Time Calculation (min) 38 min    Activity Tolerance Patient tolerated treatment well;No increased pain    Behavior During Therapy WFL for tasks assessed/performed               Past Medical History:  Diagnosis Date   Anxiety    Arthritis    back    Back pain    Cataract    small immature   Depression    GERD (gastroesophageal reflux disease)    Hyperlipidemia    Hypertension    Spinal stenosis, lumbar region, without neurogenic claudication    Past Surgical History:  Procedure Laterality Date   30-Day Event Monitor  08/2016   15 events noted. Mostly sinus rhythm with occasional bradycardia. Low rate 50. Average rate 70 bpm. Rare PACs but no runs or couplets. No arrhythmia noted.   CHOLECYSTECTOMY     HAND SURGERY     fatty tissue removed    TOTAL KNEE ARTHROPLASTY Right 05/07/2023   Procedure: RIGHT TOTAL KNEE ARTHROPLASTY;  Surgeon: Kathryne Hitch, MD;  Location: [facility] OR;  Service: Orthopedics;  Laterality: Right;   TRANSTHORACIC ECHOCARDIOGRAM  12/2001; 09/2016   a.Normal LV size and function. EF 55-65%. Normal regional wall motion. No valvular lesions;; b. October 04, 2016: Normal LV size with mild LV hypertrophy. EF 55-60%. Mildly increased RV size with normal systolic function. No significant valvular abnormalities   VAGINAL HYSTERECTOMY  1983   Zio Patch Monitor  08/2021   Mostly sinus rhythm, occasional (3.3%) isolated PACs.  Multiple (157) runs of PAT: Fastest- 5&6 beats, max 184 bpm; longest 16 beats (7.7 Sec) avg rate 128 bpm; symptoms mostly noted with  sinus rhythm and PACs.  137 triggers most were not SVT/PAT episodes.   Patient Active Problem List   Diagnosis Date Noted   Status post total right knee replacement 05/07/2023   Lumbar radiculopathy 03/15/2021   Chronic back pain 07/09/2017   Essential hypertension 09/19/2016   Heart palpitations 09/19/2016   Systolic murmur 09/19/2016   Chronic bilateral low back pain with sciatica 09/10/2016   Lumbosacral root lesions 01/25/2014   Spinal stenosis, lumbar region, without neurogenic claudication 07/21/2012   Encounter for long-term (current) use of other medications 07/21/2012   Lumbosacral root lesions, not elsewhere classified 07/21/2012    PCP: Dot Been, FNP   REFERRING PROVIDER: Kathryne Hitch*   REFERRING DIAG: [phone] (ICD-10-CM) - Status post total right knee replacement  THERAPY DIAG:  Acute pain of right knee  Stiffness of right knee, not elsewhere classified  Difficulty in walking, not elsewhere classified  Localized edema  Muscle weakness (generalized)  Rationale for Evaluation and Treatment: Rehabilitation  ONSET DATE: s/p right TKA 05/07/23  SUBJECTIVE:   SUBJECTIVE STATEMENT:Her knee is doing good, not much pain today  PERTINENT HISTORY: Post op status, sciatica and reported nerve damage in right foot PAIN:  NPRS scale: 1/10 Pain location: Rt knee Pain description: achy, sharp, zinging feeling like its healing  Aggravating factors: stairs, housework Relieving factors: prescription meds   PRECAUTIONS: None  RED FLAGS: None   WEIGHT BEARING RESTRICTIONS: No  FALLS:  Has patient fallen in last 6 months? 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  DIAGNOSTIC FINDINGS: 05/07/23 XR IMPRESSION: Right knee  arthroplasty without immediate postoperative complication.  PATIENT SURVEYS:  Patient-Specific Activity Scoring Scheme  "0" represents "unable to perform." "10" represents "able to perform at prior level. 0 1 2 3 4 5 6 7 8 9  10 (Date and Score)   Activity 05/27/2023    1. cooking  1    2.       3.     4.    5.     Total score = sum of the activity scores/number of activities Minimum detectable change (90%CI) for average score = 2 points Minimum detectable change (90%CI) for single activity score = 3 points   Score: 1/10 at eval, goal set at >5/10   COGNITION: 05/27/2023 Overall cognitive status: Within functional limits for tasks assessed     EDEMA:  05/27/2023 Mild to moderate edema in Rt knee  LOWER EXTREMITY ROM:  Active ROM/PROM Right 05/27/2023 Right 06/05/2023 Right 06/13/23  Hip flexion     Hip extension     Hip abduction     Hip adduction     Hip internal rotation     Hip external rotation     Knee flexion AAROM 85 PROM 90 98 with belt P:100  Knee extension AROM 7 5 Active   Ankle dorsiflexion     Ankle plantarflexion     Ankle inversion     Ankle eversion      (Blank rows = not tested)  LOWER EXTREMITY MMT:  MMT tested in sitting Right 05/27/2023 Left eval  Hip flexion 4   Hip extension    Hip abduction 4   Hip adduction    Hip internal rotation    Hip external rotation    Knee flexion 4   Knee extension 4   Ankle dorsiflexion    Ankle plantarflexion    Ankle inversion    Ankle eversion     (Blank rows = not tested)  GAIT: 05/27/2023 EVAL Distance walked: 50 feet with quad cane, 50 feet without cane Arrives with cane, has difficulty with sequencing and is able to ambulate without it.              TODAY'S TREATMENT:                                                                       DATE:  06/13/2023 Nu step L5 X 8 min UE/LE into max knee flexion Seated AAROM knee flexion (left pushes right into flexion) 10 x 5 seconds Supine  knee flexion with strap with quadriceps set in between reps 10 x 5 seconds each with PT assistance LAQ 3# 3X10 on Rt  Functional Activities (stairs, sit to stand): Double Leg Press 75# 2x10  full extension and stretch slowly into flexion  Single  leg Press 25# 2 X10  full extension and stretch slowly into flexion Step ups forward onto 6 inch step leading with Rt, X 10 reps with one UE support  Manual: Rt knee PROM with overpressure in sitting   06/07/23   Scifit bike Seat 7 partial rotations x10 minutes    Knee flexion AAROM 15x5 second holds  LAQs 2# 2x15 R LE  STS from chair L LE staggered forward/R back x12   Tennis ball massage to quad in supine with LEs elevated Knee flexion OP seated   06/05/2023 Recumbent bike for AAROM (not able to pedal) Seat 5 for 8 minutes Seated AAROM knee flexion (left pushes right into flexion) 10 x 10 seconds Supine knee flexion with strap with quadriceps set in between reps 10 x 5 seconds each with PT assistance Quadriceps sets with right heel prop 10 x 5 seconds  Functional Activities (stairs, sit to stand): Double Leg Press 75# 15 x full extension and stretch slowly into flexion  Single leg Press 25# 10 x full extension and stretch slowly into flexion  Vaso right knee 10 minutes Medium Pressure 34*   PATIENT EDUCATION: Education details: HEP, PT plan of care Person educated: Patient Education method: Explanation, Demonstration, Verbal cues, and Handouts Education comprehension: verbalized understanding and needs further education   HOME EXERCISE PROGRAM: Access Code: [email or web address] URL: [email or web address] Date: 06/05/2023 Prepared by: Pauletta Browns  Exercises - Supine Heel Slide with Strap  - 2 x daily - 6 x weekly - 1 sets - 10 reps - 5 hold - Supine Quadricep Sets  - 2 x daily - 6 x weekly - 1 sets - 10 reps - 5 sec hold - Seated Long Arc Quad  - 2 x daily - 6 x weekly - 2 sets - 10 reps - Seated Knee Flexion Stretch  -  2 x daily - 6 x weekly - 1 sets - 10 reps - 5 sec hold - Seated Hamstring Stretch  - 2 x daily - 6 x weekly - 1 sets - 3 reps - 30 sec hold - Sit to Stand  - 2 x daily - 6 x weekly - 1 sets - 10 reps - Seated Knee Flexion AAROM  - 5 x daily - 7 x weekly - 1 sets - 1 reps - 3 minutes hold  ASSESSMENT:  CLINICAL IMPRESSION: She is improving overall in PT but still missing some strength and ROM for Rt knee. PT will continue to work to improve this as tolerated to improve function.    OBJECTIVE IMPAIRMENTS: decreased activity tolerance, difficulty walking, decreased balance, decreased endurance, decreased mobility, decreased ROM, decreased strength, impaired flexibility, impaired UE/LE use, and pain.  ACTIVITY LIMITATIONS: bending, lifting, carry, locomotion, cleaning, community activity, driving, and or occupation  PERSONAL FACTORS: see above PMH are also affecting patient's functional outcome.  REHAB POTENTIAL: Good  CLINICAL DECISION MAKING: Stable/uncomplicated  EVALUATION COMPLEXITY: Low    GOALS: Short term PT Goals Target date: 06/24/2023   Pt will be I and compliant with HEP. Baseline:  Goal status: On Going 06/05/2023  Long term PT goals Target date:07/22/2023   Pt will improve ROM to [facility] to improve functional mobility Baseline: Goal status: New Pt will improve  hip/knee strength to at least 5-/5 MMT to improve functional strength Baseline: Goal status: New Pt will improve PSFS to >5/10 to show improved function with cooking and occupation Baseline: Goal status: New Pt will reduce pain to overall less than 2-3/10 with usual activity  and work activity. Baseline: Goal status: New Pt will be able to ambulate community distances at least 541ft without AD and  WNL gait pattern without complaints Baseline: Goal status: New  PLAN: PT FREQUENCY: 2-3 times per week   PT DURATION: 6-8 weeks  PLANNED INTERVENTIONS (unless contraindicated): aquatic PT, Canalith  repositioning, cryotherapy, Electrical stimulation, Iontophoresis with 4 mg/ml dexamethasome, Moist heat, traction, Ultrasound, gait training, Therapeutic exercise, balance training, neuromuscular re-education, patient/family education, manual techniques, passive ROM, dry needling, taping, vasopnuematic device, vestibular, spinal manipulations, joint manipulations 97110-Therapeutic exercises, 97530- Therapeutic activity, [id number]- Neuromuscular re-education, 97535- Self Care, and [id number]- Manual therapy  PLAN FOR NEXT SESSION: Strengthening, balance improvements.  Knee flexion gains  Ivery Quale, PT, DPT 06/13/23 3:01 PM

## 2023-06-18 ENCOUNTER — Encounter: Payer: Self-pay | Admitting: Physical Therapy

## 2023-06-18 ENCOUNTER — Ambulatory Visit (INDEPENDENT_AMBULATORY_CARE_PROVIDER_SITE_OTHER): Payer: 59 | Admitting: Physical Therapy

## 2023-06-18 DIAGNOSIS — R262 Difficulty in walking, not elsewhere classified: Secondary | ICD-10-CM

## 2023-06-18 DIAGNOSIS — M25661 Stiffness of right knee, not elsewhere classified: Secondary | ICD-10-CM | POA: Diagnosis not present

## 2023-06-18 DIAGNOSIS — M25561 Pain in right knee: Secondary | ICD-10-CM | POA: Diagnosis not present

## 2023-06-18 DIAGNOSIS — R600 Localized edema: Secondary | ICD-10-CM | POA: Diagnosis not present

## 2023-06-18 DIAGNOSIS — M6281 Muscle weakness (generalized): Secondary | ICD-10-CM

## 2023-06-18 NOTE — Therapy (Signed)
 OUTPATIENT PHYSICAL THERAPY TREATMENT/Progress note Progress Note reporting period 05/27/23  to 06/18/23  See below for objective and subjective measurements relating to patients progress with PT.    Patient Name: Morgan Swanson MRN: [id number] DOB:Mar 07, 1951, 73 y.o., female Today's Date: 06/18/2023  END OF SESSION:  PT End of Session - 06/18/23 1432     Visit Number 5    Number of Visits 18    Date for PT Re-Evaluation 07/22/23    Authorization Type [employer] State and Medicare    Progress Note Due on Visit 15    PT Start Time 1430    PT Stop Time 1508    PT Time Calculation (min) 38 min    Activity Tolerance Patient tolerated treatment well;No increased pain    Behavior During Therapy WFL for tasks assessed/performed               Past Medical History:  Diagnosis Date   Anxiety    Arthritis    back    Back pain    Cataract    small immature   Depression    GERD (gastroesophageal reflux disease)    Hyperlipidemia    Hypertension    Spinal stenosis, lumbar region, without neurogenic claudication    Past Surgical History:  Procedure Laterality Date   30-Day Event Monitor  08/2016   15 events noted. Mostly sinus rhythm with occasional bradycardia. Low rate 50. Average rate 70 bpm. Rare PACs but no runs or couplets. No arrhythmia noted.   CHOLECYSTECTOMY     HAND SURGERY     fatty tissue removed    TOTAL KNEE ARTHROPLASTY Right 05/07/2023   Procedure: RIGHT TOTAL KNEE ARTHROPLASTY;  Surgeon: Kathryne Hitch, MD;  Location: [facility] OR;  Service: Orthopedics;  Laterality: Right;   TRANSTHORACIC ECHOCARDIOGRAM  12/2001; 09/2016   a.Normal LV size and function. EF 55-65%. Normal regional wall motion. No valvular lesions;; b. October 04, 2016: Normal LV size with mild LV hypertrophy. EF 55-60%. Mildly increased RV size with normal systolic function. No significant valvular abnormalities   VAGINAL HYSTERECTOMY  1983   Zio Patch Monitor  08/2021   Mostly sinus rhythm,  occasional (3.3%) isolated PACs.  Multiple (157) runs of PAT: Fastest- 5&6 beats, max 184 bpm; longest 16 beats (7.7 Sec) avg rate 128 bpm; symptoms mostly noted with sinus rhythm and PACs.  137 triggers most were not SVT/PAT episodes.   Patient Active Problem List   Diagnosis Date Noted   Status post total right knee replacement 05/07/2023   Lumbar radiculopathy 03/15/2021   Chronic back pain 07/09/2017   Essential hypertension 09/19/2016   Heart palpitations 09/19/2016   Systolic murmur 09/19/2016   Chronic bilateral low back pain with sciatica 09/10/2016   Lumbosacral root lesions 01/25/2014   Spinal stenosis, lumbar region, without neurogenic claudication 07/21/2012   Encounter for long-term (current) use of other medications 07/21/2012   Lumbosacral root lesions, not elsewhere classified 07/21/2012    PCP: Dot Been, FNP   REFERRING PROVIDER: Kathryne Hitch*   REFERRING DIAG: [phone] (ICD-10-CM) - Status post total right knee replacement  THERAPY DIAG:  Acute pain of right knee  Stiffness of right knee, not elsewhere classified  Difficulty in walking, not elsewhere classified  Localized edema  Muscle weakness (generalized)  Rationale for Evaluation and Treatment: Rehabilitation  ONSET DATE: s/p right TKA 05/07/23  SUBJECTIVE:   SUBJECTIVE STATEMENT:relays she is feeling good today  PERTINENT HISTORY: Post op status, sciatica and reported nerve damage  in right foot PAIN:  NPRS scale: 1/10 Pain location: Rt knee Pain description: achy, sharp, zinging feeling like its healing  Aggravating factors: stairs, housework Relieving factors: prescription meds   PRECAUTIONS: None  RED FLAGS: None   WEIGHT BEARING RESTRICTIONS: No  FALLS:  Has patient fallen in last 6 months? 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  DIAGNOSTIC FINDINGS: 05/07/23 XR IMPRESSION: Right knee arthroplasty without immediate postoperative complication.  PATIENT SURVEYS:  Patient-Specific Activity Scoring Scheme  "0" represents "unable to perform." "10" represents "able to perform at prior level. 0 1 2 3 4 5 6 7 8 9  10 (Date and Score)   Activity 05/27/2023    1. cooking  1    2.       3.     4.    5.     Total score = sum of the activity scores/number of activities Minimum detectable change (90%CI) for average score = 2 points Minimum detectable change (90%CI) for single activity score = 3 points   Score: 1/10 at eval, goal set at >5/10   COGNITION: 05/27/2023 Overall cognitive status: Within functional limits for tasks assessed     EDEMA:  05/27/2023 Mild to moderate edema in Rt knee  LOWER EXTREMITY ROM:  Active ROM/PROM Right 05/27/2023 Right 06/05/2023 Right 06/13/23 Right 06/18/23  Hip flexion      Hip extension      Hip abduction      Hip adduction      Hip internal rotation      Hip external rotation      Knee flexion AAROM 85 PROM 90 98 with belt P:100 A:94 P:103  Knee extension AROM 7 5 Active  3 AROM  Ankle dorsiflexion      Ankle plantarflexion      Ankle inversion      Ankle eversion       (Blank rows = not tested)  LOWER EXTREMITY MMT:  MMT tested in sitting Right 05/27/2023 Right 06/18/23  Hip flexion 4   Hip extension    Hip abduction 4   Hip adduction    Hip internal rotation    Hip external rotation    Knee flexion 4 5  Knee extension 4 4+  Ankle dorsiflexion    Ankle plantarflexion    Ankle inversion    Ankle eversion     (Blank rows = not tested)  GAIT: 05/27/2023 EVAL Distance walked: 50 feet with quad cane, 50 feet without cane Arrives with cane, has difficulty with sequencing and is able to ambulate without it.              TODAY'S TREATMENT:                                                                        DATE:  06/18/2023 Nu step L5 X 8 min UE/LE into max knee flexion Seated AAROM knee flexion (left pushes right into flexion) 10 x 5 seconds LAQ 4# 3X10 on Rt  Functional Activities (  stairs, sit to stand): Double Leg Press 75# 2x12  full extension and stretch slowly into flexion  Single leg Press 31# 2 X10  full extension and stretch slowly into flexion Step ups forward onto 6 inch step leading with Rt, X 12 reps with one UE support Step ups lateral on 6 inch step with Rt, 12 reps with bilat UE suppport  Manual: Rt knee PROM with overpressure in sitting      PATIENT EDUCATION: Education details: HEP, PT plan of care Person educated: Patient Education method: Explanation, Demonstration, Verbal cues, and Handouts Education comprehension: verbalized understanding and needs further education   HOME EXERCISE PROGRAM: Access Code: [email or web address] URL: [email or web address] Date: 06/05/2023 Prepared by: Pauletta Browns  Exercises - Supine Heel Slide with Strap  - 2 x daily - 6 x weekly - 1 sets - 10 reps - 5 hold - Supine Quadricep Sets  - 2 x daily - 6 x weekly - 1 sets - 10 reps - 5 sec hold - Seated Long Arc Quad  - 2 x daily - 6 x weekly - 2 sets - 10 reps - Seated Knee Flexion Stretch  - 2 x daily - 6 x weekly - 1 sets - 10 reps - 5 sec hold - Seated Hamstring Stretch  - 2 x daily - 6 x weekly - 1 sets - 3 reps - 30 sec hold - Sit to Stand  - 2 x daily - 6 x weekly - 1 sets - 10 reps - Seated Knee Flexion AAROM  - 5 x daily - 7 x weekly - 1 sets - 1 reps - 3 minutes hold  ASSESSMENT:  CLINICAL IMPRESSION: MD progress note reflects she is improving overall. She does still have some ROM deficits that we are working hard to improve so continued PT recommended.     OBJECTIVE IMPAIRMENTS: decreased activity tolerance, difficulty walking, decreased balance, decreased endurance, decreased mobility, decreased ROM,  decreased strength, impaired flexibility, impaired UE/LE use, and pain.  ACTIVITY LIMITATIONS: bending, lifting, carry, locomotion, cleaning, community activity, driving, and or occupation  PERSONAL FACTORS: see above PMH are also affecting patient's functional outcome.  REHAB POTENTIAL: Good  CLINICAL DECISION MAKING: Stable/uncomplicated  EVALUATION COMPLEXITY: Low    GOALS: Short term PT Goals Target date: 06/24/2023   Pt will be I and compliant with HEP. Baseline:  Goal status: MET 06/18/23  Long term PT goals Target date:07/22/2023   Pt will improve ROM to [facility] to improve functional mobility Baseline: Goal status:ongoing 06/18/23 Pt will improve  hip/knee strength to at least 5-/5 MMT to improve functional strength Baseline: Goal status: ongoing 06/18/23 Pt will improve PSFS to >5/10 to show improved function with cooking and occupation Baseline: Goal status: ongoing 06/18/23 Pt will reduce pain to overall less than 2-3/10 with usual activity and work activity. Baseline: Goal status: MET 06/18/23 Pt will be able to ambulate community distances at least 571ft without AD and  WNL gait pattern without complaints Baseline: Goal status: MET 06/18/23 PLAN: PT FREQUENCY: 2-3 times per week   PT DURATION: 6-8 weeks  PLANNED INTERVENTIONS (unless contraindicated): aquatic PT, Canalith repositioning, cryotherapy, Electrical stimulation, Iontophoresis with 4 mg/ml dexamethasome, Moist heat, traction, Ultrasound, gait training, Therapeutic exercise, balance training, neuromuscular re-education, patient/family education, manual techniques, passive ROM, dry needling, taping, vasopnuematic device, vestibular, spinal manipulations, joint manipulations 97110-Therapeutic exercises, 97530- Therapeutic activity, 97112- Neuromuscular re-education, 97535- Self Care, and 47829- Manual therapy  PLAN FOR NEXT SESSION: Strengthening, balance improvements.  Knee flexion gains  Ivery Quale, PT,  DPT 06/18/23 3:19 PM

## 2023-06-19 ENCOUNTER — Encounter: Payer: Self-pay | Admitting: Orthopaedic Surgery

## 2023-06-19 ENCOUNTER — Ambulatory Visit: Payer: 59 | Admitting: Orthopaedic Surgery

## 2023-06-19 DIAGNOSIS — Z96651 Presence of right artificial knee joint: Secondary | ICD-10-CM

## 2023-06-19 NOTE — Progress Notes (Signed)
 The patient is 6 weeks status post a right total knee arthroplasty.  She is ambulate without assistive device.  She said therapy is going well.  She is 73 years old.  She said that they have been able to flex her to just past 103 degrees yesterday.  Examination of her right knee does show swelling to be expected.  Her extension lacks full extension by about 3 degrees and I can flex her today to about 100 degrees.  She is making progress.  She will continue with therapy and working on range of motion of her right knee.  I would like to see her back in 6 weeks we will have a standing AP and lateral of her right knee at that visit.

## 2023-06-20 ENCOUNTER — Ambulatory Visit: Payer: 59 | Admitting: Physical Therapy

## 2023-06-20 ENCOUNTER — Encounter: Payer: Self-pay | Admitting: Physical Therapy

## 2023-06-20 DIAGNOSIS — R600 Localized edema: Secondary | ICD-10-CM

## 2023-06-20 DIAGNOSIS — M6281 Muscle weakness (generalized): Secondary | ICD-10-CM

## 2023-06-20 DIAGNOSIS — M25661 Stiffness of right knee, not elsewhere classified: Secondary | ICD-10-CM | POA: Diagnosis not present

## 2023-06-20 DIAGNOSIS — R262 Difficulty in walking, not elsewhere classified: Secondary | ICD-10-CM

## 2023-06-20 DIAGNOSIS — M25561 Pain in right knee: Secondary | ICD-10-CM | POA: Diagnosis not present

## 2023-06-20 NOTE — Therapy (Signed)
 OUTPATIENT PHYSICAL THERAPY TREATMENT   Patient Name: Morgan Swanson MRN: [id number] DOB:12/15/1950, 73 y.o., female Today's Date: 06/20/2023  END OF SESSION:  PT End of Session - 06/20/23 1435     Visit Number 6    Number of Visits 18    Date for PT Re-Evaluation 07/22/23    Authorization Type [employer] State and Medicare    Progress Note Due on Visit 15    PT Start Time 1430    PT Stop Time 1509    PT Time Calculation (min) 39 min    Activity Tolerance Patient tolerated treatment well;No increased pain    Behavior During Therapy WFL for tasks assessed/performed               Past Medical History:  Diagnosis Date   Anxiety    Arthritis    back    Back pain    Cataract    small immature   Depression    GERD (gastroesophageal reflux disease)    Hyperlipidemia    Hypertension    Spinal stenosis, lumbar region, without neurogenic claudication    Past Surgical History:  Procedure Laterality Date   30-Day Event Monitor  08/2016   15 events noted. Mostly sinus rhythm with occasional bradycardia. Low rate 50. Average rate 70 bpm. Rare PACs but no runs or couplets. No arrhythmia noted.   CHOLECYSTECTOMY     HAND SURGERY     fatty tissue removed    TOTAL KNEE ARTHROPLASTY Right 05/07/2023   Procedure: RIGHT TOTAL KNEE ARTHROPLASTY;  Surgeon: Kathryne Hitch, MD;  Location: [facility] OR;  Service: Orthopedics;  Laterality: Right;   TRANSTHORACIC ECHOCARDIOGRAM  12/2001; 09/2016   a.Normal LV size and function. EF 55-65%. Normal regional wall motion. No valvular lesions;; b. October 04, 2016: Normal LV size with mild LV hypertrophy. EF 55-60%. Mildly increased RV size with normal systolic function. No significant valvular abnormalities   VAGINAL HYSTERECTOMY  1983   Zio Patch Monitor  08/2021   Mostly sinus rhythm, occasional (3.3%) isolated PACs.  Multiple (157) runs of PAT: Fastest- 5&6 beats, max 184 bpm; longest 16 beats (7.7 Sec) avg rate 128 bpm; symptoms mostly noted with  sinus rhythm and PACs.  137 triggers most were not SVT/PAT episodes.   Patient Active Problem List   Diagnosis Date Noted   Status post total right knee replacement 05/07/2023   Lumbar radiculopathy 03/15/2021   Chronic back pain 07/09/2017   Essential hypertension 09/19/2016   Heart palpitations 09/19/2016   Systolic murmur 09/19/2016   Chronic bilateral low back pain with sciatica 09/10/2016   Lumbosacral root lesions 01/25/2014   Spinal stenosis, lumbar region, without neurogenic claudication 07/21/2012   Encounter for long-term (current) use of other medications 07/21/2012   Lumbosacral root lesions, not elsewhere classified 07/21/2012    PCP: Dot Been, FNP   REFERRING PROVIDER: Kathryne Hitch*   REFERRING DIAG: [id number] (ICD-10-CM) - Status post total right knee replacement  THERAPY DIAG:  Acute pain of right knee  Stiffness of right knee, not elsewhere classified  Difficulty in walking, not elsewhere classified  Localized edema  Muscle weakness (generalized)  Rationale for Evaluation and Treatment: Rehabilitation  ONSET DATE: s/p right TKA 05/07/23  SUBJECTIVE:   SUBJECTIVE STATEMENT:denies any knee pain, says MD was pleased and has cleared her to drive  PERTINENT HISTORY: Post op status, sciatica and reported nerve damage in right foot PAIN:  NPRS scale: 0/10 today Pain location: Rt knee Pain description: achy,  sharp, zinging feeling like its healing  Aggravating factors: stairs, housework Relieving factors: prescription meds   PRECAUTIONS: None  RED FLAGS: None   WEIGHT BEARING RESTRICTIONS: No  FALLS:  Has patient fallen in last 6 months? 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  DIAGNOSTIC FINDINGS: 05/07/23  XR IMPRESSION: Right knee arthroplasty without immediate postoperative complication.  PATIENT SURVEYS:  Patient-Specific Activity Scoring Scheme  "0" represents "unable to perform." "10" represents "able to perform at prior level. 0 1 2 3 4 5 6 7 8 9  10 (Date and Score)   Activity 05/27/2023    1. cooking  1    2.       3.     4.    5.     Total score = sum of the activity scores/number of activities Minimum detectable change (90%CI) for average score = 2 points Minimum detectable change (90%CI) for single activity score = 3 points   Score: 1/10 at eval, goal set at >5/10   COGNITION: 05/27/2023 Overall cognitive status: Within functional limits for tasks assessed     EDEMA:  05/27/2023 Mild to moderate edema in Rt knee  LOWER EXTREMITY ROM:  Active ROM/PROM Right 05/27/2023 Right 06/05/2023 Right 06/13/23 Right 06/18/23  Hip flexion      Hip extension      Hip abduction      Hip adduction      Hip internal rotation      Hip external rotation      Knee flexion AAROM 85 PROM 90 98 with belt P:100 A:94 P:103  Knee extension AROM 7 5 Active  3 AROM  Ankle dorsiflexion      Ankle plantarflexion      Ankle inversion      Ankle eversion       (Blank rows = not tested)  LOWER EXTREMITY MMT:  MMT tested in sitting Right 05/27/2023 Right 06/18/23  Hip flexion 4   Hip extension    Hip abduction 4   Hip adduction    Hip internal rotation    Hip external rotation    Knee flexion 4 5  Knee extension 4 4+  Ankle dorsiflexion    Ankle plantarflexion    Ankle inversion    Ankle eversion     (Blank rows = not tested)  GAIT: 05/27/2023 EVAL Distance walked: 50 feet with quad cane, 50 feet without cane Arrives with cane, has difficulty with sequencing and is able to ambulate without it.              TODAY'S TREATMENT:                                                                       DATE:  06/18/2023 Nu step L5 X 8 min UE/LE  Seated AAROM knee flexion  (left pushes right into flexion) 10 x 5 seconds LAQ 4# 3X10 on Rt Scit fit bike L1 X 5 min seat #6 Standing hamstring curls 4# 2X10 Standing hip abd 4# 2X10  Functional  Activities (stairs, sit to stand): Sit to stands, no UE support 2  X 10 reps  Step ups forward onto 6 inch step leading with Rt, X 15 reps with one UE support Step ups lateral on 6 inch step with Rt, 15d reps with bilat UE suppport  Manual: Rt knee PROM with overpressure in sitting   06/18/2023 Nu step L5 X 8 min UE/LE into max knee flexion Seated AAROM knee flexion (left pushes right into flexion) 10 x 5 seconds LAQ 4# 3X10 on Rt  Functional Activities (stairs, sit to stand): Double Leg Press 75# 2x12  full extension and stretch slowly into flexion  Single leg Press 31# 2 X10  full extension and stretch slowly into flexion Step ups forward onto 6 inch step leading with Rt, X 12 reps with one UE support Step ups lateral on 6 inch step with Rt, 12 reps with bilat UE suppport  Manual: Rt knee PROM with overpressure in sitting      PATIENT EDUCATION: Education details: HEP, PT plan of care Person educated: Patient Education method: Explanation, Demonstration, Verbal cues, and Handouts Education comprehension: verbalized understanding and needs further education   HOME EXERCISE PROGRAM: Access Code: [email or web address] URL: [email or web address] Date: 06/05/2023 Prepared by: Pauletta Browns  Exercises - Supine Heel Slide with Strap  - 2 x daily - 6 x weekly - 1 sets - 10 reps - 5 hold - Supine Quadricep Sets  - 2 x daily - 6 x weekly - 1 sets - 10 reps - 5 sec hold - Seated Long Arc Quad  - 2 x daily - 6 x weekly - 2 sets - 10 reps - Seated Knee Flexion Stretch  - 2 x daily - 6 x weekly - 1 sets - 10 reps - 5 sec hold - Seated Hamstring Stretch  - 2 x daily - 6 x weekly - 1 sets - 3 reps - 30 sec hold - Sit to Stand  - 2 x daily - 6 x weekly - 1 sets - 10 reps - Seated Knee Flexion AAROM  - 5 x daily - 7 x  weekly - 1 sets - 1 reps - 3 minutes hold  ASSESSMENT:  CLINICAL IMPRESSION: She is doing well with her knee pain and improving her strength. Still with some knee flexion stiffness we are working hard to improve in PT.  OBJECTIVE IMPAIRMENTS: decreased activity tolerance, difficulty walking, decreased balance, decreased endurance, decreased mobility, decreased ROM, decreased strength, impaired flexibility, impaired UE/LE use, and pain.  ACTIVITY LIMITATIONS: bending, lifting, carry, locomotion, cleaning, community activity, driving, and or occupation  PERSONAL FACTORS: see above PMH are also affecting patient's functional outcome.  REHAB POTENTIAL: Good  CLINICAL DECISION MAKING: Stable/uncomplicated  EVALUATION COMPLEXITY: Low    GOALS: Short term PT Goals Target date: 06/24/2023   Pt will be I and compliant with HEP. Baseline:  Goal status: MET 06/18/23  Long term PT goals Target date:07/22/2023   Pt will improve ROM to [facility] to improve functional mobility Baseline: Goal status:ongoing 06/18/23 Pt will improve  hip/knee strength to at least 5-/5 MMT to improve functional strength Baseline: Goal status: ongoing 06/18/23 Pt will improve PSFS to >5/10 to show improved function with cooking and occupation Baseline: Goal status: ongoing 06/18/23 Pt will reduce pain to overall less than 2-3/10 with usual activity and work activity. Baseline: Goal status: MET 06/18/23 Pt will be able to ambulate community distances at least 575ft without AD and  WNL gait pattern without complaints Baseline:  Goal status: MET 06/18/23 PLAN: PT FREQUENCY: 2-3 times per week   PT DURATION: 6-8 weeks  PLANNED INTERVENTIONS (unless contraindicated): aquatic PT, Canalith repositioning, cryotherapy, Electrical stimulation, Iontophoresis with 4 mg/ml dexamethasome, Moist heat, traction, Ultrasound, gait training, Therapeutic exercise, balance training, neuromuscular re-education, patient/family education,  manual techniques, passive ROM, dry needling, taping, vasopnuematic device, vestibular, spinal manipulations, joint manipulations 97110-Therapeutic exercises, 97530- Therapeutic activity, 97112- Neuromuscular re-education, 97535- Self Care, and 69629- Manual therapy  PLAN FOR NEXT SESSION: Sci fit bike, Strengthening, balance improvements.  Knee flexion gains  Ivery Quale, PT, DPT 06/20/23 2:35 PM

## 2023-06-25 ENCOUNTER — Ambulatory Visit (INDEPENDENT_AMBULATORY_CARE_PROVIDER_SITE_OTHER): Payer: 59 | Admitting: Physical Therapy

## 2023-06-25 ENCOUNTER — Encounter: Payer: Self-pay | Admitting: Physical Therapy

## 2023-06-25 DIAGNOSIS — M25561 Pain in right knee: Secondary | ICD-10-CM

## 2023-06-25 DIAGNOSIS — M25661 Stiffness of right knee, not elsewhere classified: Secondary | ICD-10-CM | POA: Diagnosis not present

## 2023-06-25 DIAGNOSIS — R262 Difficulty in walking, not elsewhere classified: Secondary | ICD-10-CM | POA: Diagnosis not present

## 2023-06-25 DIAGNOSIS — R600 Localized edema: Secondary | ICD-10-CM

## 2023-06-25 DIAGNOSIS — M6281 Muscle weakness (generalized): Secondary | ICD-10-CM

## 2023-06-25 NOTE — Therapy (Signed)
 OUTPATIENT PHYSICAL THERAPY TREATMENT   Patient Name: Morgan Swanson MRN: [id number] DOB:[birth year], 73 y.o., female Today's Date: 06/25/2023  END OF SESSION:  PT End of Session - 06/25/23 1445     Visit Number 7    Number of Visits 18    Date for PT Re-Evaluation 07/22/23    Authorization Type [employer] State and Medicare    Progress Note Due on Visit 15    PT Start Time 1430    PT Stop Time 1508    PT Time Calculation (min) 38 min    Activity Tolerance Patient tolerated treatment well;No increased pain    Behavior During Therapy WFL for tasks assessed/performed               Past Medical History:  Diagnosis Date   Anxiety    Arthritis    back    Back pain    Cataract    small immature   Depression    GERD (gastroesophageal reflux disease)    Hyperlipidemia    Hypertension    Spinal stenosis, lumbar region, without neurogenic claudication    Past Surgical History:  Procedure Laterality Date   30-Day Event Monitor  08/2016   15 events noted. Mostly sinus rhythm with occasional bradycardia. Low rate 50. Average rate 70 bpm. Rare PACs but no runs or couplets. No arrhythmia noted.   CHOLECYSTECTOMY     HAND SURGERY     fatty tissue removed    TOTAL KNEE ARTHROPLASTY Right 05/07/2023   Procedure: RIGHT TOTAL KNEE ARTHROPLASTY;  Surgeon: Kathryne Hitch, MD;  Location: [facility] OR;  Service: Orthopedics;  Laterality: Right;   TRANSTHORACIC ECHOCARDIOGRAM  12/2001; 09/2016   a.Normal LV size and function. EF 55-65%. Normal regional wall motion. No valvular lesions;; b. October 04, 2016: Normal LV size with mild LV hypertrophy. EF 55-60%. Mildly increased RV size with normal systolic function. No significant valvular abnormalities   VAGINAL HYSTERECTOMY  1983   Zio Patch Monitor  08/2021   Mostly sinus rhythm, occasional (3.3%) isolated PACs.  Multiple (157) runs of PAT: Fastest- 5&6 beats, max 184 bpm; longest 16 beats (7.7 Sec) avg rate 128 bpm; symptoms mostly noted with  sinus rhythm and PACs.  137 triggers most were not SVT/PAT episodes.   Patient Active Problem List   Diagnosis Date Noted   Status post total right knee replacement 05/07/2023   Lumbar radiculopathy 03/15/2021   Chronic back pain 07/09/2017   Essential hypertension 09/19/2016   Heart palpitations 09/19/2016   Systolic murmur 09/19/2016   Chronic bilateral low back pain with sciatica 09/10/2016   Lumbosacral root lesions 01/25/2014   Spinal stenosis, lumbar region, without neurogenic claudication 07/21/2012   Encounter for long-term (current) use of other medications 07/21/2012   Lumbosacral root lesions, not elsewhere classified 07/21/2012    PCP: Dot Been, FNP   REFERRING PROVIDER: Kathryne Hitch*   REFERRING DIAG: [phone] (ICD-10-CM) - Status post total right knee replacement  THERAPY DIAG:  Acute pain of right knee  Stiffness of right knee, not elsewhere classified  Difficulty in walking, not elsewhere classified  Localized edema  Muscle weakness (generalized)  Rationale for Evaluation and Treatment: Rehabilitation  ONSET DATE: s/p right TKA 05/07/23  SUBJECTIVE:   SUBJECTIVE STATEMENT:denies any knee pain upon arrival PERTINENT HISTORY: Post op status, sciatica and reported nerve damage in right foot PAIN:  NPRS scale: 0/10 today Pain location: Rt knee Pain description: achy, sharp, zinging feeling like its healing  Aggravating factors:  stairs, housework Relieving factors: prescription meds   PRECAUTIONS: None  RED FLAGS: None   WEIGHT BEARING RESTRICTIONS: No  FALLS:  Has patient fallen in last 6 months? 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  DIAGNOSTIC FINDINGS: 05/07/23 XR IMPRESSION: Right knee arthroplasty  without immediate postoperative complication.  PATIENT SURVEYS:  Patient-Specific Activity Scoring Scheme  "0" represents "unable to perform." "10" represents "able to perform at prior level. 0 1 2 3 4 5 6 7 8 9  10 (Date and Score)   Activity 05/27/2023    1. cooking  1    2.       3.     4.    5.     Total score = sum of the activity scores/number of activities Minimum detectable change (90%CI) for average score = 2 points Minimum detectable change (90%CI) for single activity score = 3 points   Score: 1/10 at eval, goal set at >5/10   COGNITION: 05/27/2023 Overall cognitive status: Within functional limits for tasks assessed     EDEMA:  05/27/2023 Mild to moderate edema in Rt knee  LOWER EXTREMITY ROM:  Active ROM/PROM Right 05/27/2023 Right 06/05/2023 Right 06/13/23 Right 06/18/23  Hip flexion      Hip extension      Hip abduction      Hip adduction      Hip internal rotation      Hip external rotation      Knee flexion AAROM 85 PROM 90 98 with belt P:100 A:94 P:103  Knee extension AROM 7 5 Active  3 AROM  Ankle dorsiflexion      Ankle plantarflexion      Ankle inversion      Ankle eversion       (Blank rows = not tested)  LOWER EXTREMITY MMT:  MMT tested in sitting Right 05/27/2023 Right 06/18/23  Hip flexion 4   Hip extension    Hip abduction 4   Hip adduction    Hip internal rotation    Hip external rotation    Knee flexion 4 5  Knee extension 4 4+  Ankle dorsiflexion    Ankle plantarflexion    Ankle inversion    Ankle eversion     (Blank rows = not tested)  GAIT: 05/27/2023 EVAL Distance walked: 50 feet with quad cane, 50 feet without cane Arrives with cane, has difficulty with sequencing and is able to ambulate without it.              TODAY'S TREATMENT:                                                                       DATE:  06/25/2023 Sci fit bike seat # 6 X 10 min full revolutions Seated AAROM knee flexion (left pushes right  into flexion) 10 x 5 seconds LAQ 4# 3X10 on Rt   Functional Activities (stairs, sit to stand): Leg press DL 16# 1W96, then Rt leg only 31# 2X12 reps Step ups forward onto 6 inch step  leading with Rt, X 15 reps with one UE support Step ups lateral on 6 inch step with Rt, 15d reps with bilat UE suppport  Manual: Rt knee PROM with overpressure in sitting      PATIENT EDUCATION: Education details: HEP, PT plan of care Person educated: Patient Education method: Explanation, Demonstration, Verbal cues, and Handouts Education comprehension: verbalized understanding and needs further education   HOME EXERCISE PROGRAM: Access Code: [email or web address] URL: [email or web address] Date: 06/05/2023 Prepared by: Pauletta Browns  Exercises - Supine Heel Slide with Strap  - 2 x daily - 6 x weekly - 1 sets - 10 reps - 5 hold - Supine Quadricep Sets  - 2 x daily - 6 x weekly - 1 sets - 10 reps - 5 sec hold - Seated Long Arc Quad  - 2 x daily - 6 x weekly - 2 sets - 10 reps - Seated Knee Flexion Stretch  - 2 x daily - 6 x weekly - 1 sets - 10 reps - 5 sec hold - Seated Hamstring Stretch  - 2 x daily - 6 x weekly - 1 sets - 3 reps - 30 sec hold - Sit to Stand  - 2 x daily - 6 x weekly - 1 sets - 10 reps - Seated Knee Flexion AAROM  - 5 x daily - 7 x weekly - 1 sets - 1 reps - 3 minutes hold  ASSESSMENT:  CLINICAL IMPRESSION: She Is making steady progress each week, She will continue to benefit from PT to reach her functional goals.   OBJECTIVE IMPAIRMENTS: decreased activity tolerance, difficulty walking, decreased balance, decreased endurance, decreased mobility, decreased ROM, decreased strength, impaired flexibility, impaired UE/LE use, and pain.  ACTIVITY LIMITATIONS: bending, lifting, carry, locomotion, cleaning, community activity, driving, and or occupation  PERSONAL FACTORS: see above PMH are also affecting patient's functional outcome.  REHAB POTENTIAL: Good  CLINICAL DECISION  MAKING: Stable/uncomplicated  EVALUATION COMPLEXITY: Low    GOALS: Short term PT Goals Target date: 06/24/2023   Pt will be I and compliant with HEP. Baseline:  Goal status: MET 06/18/23  Long term PT goals Target date:07/22/2023   Pt will improve ROM to [facility] to improve functional mobility Baseline: Goal status:ongoing 06/18/23 Pt will improve  hip/knee strength to at least 5-/5 MMT to improve functional strength Baseline: Goal status: ongoing 06/18/23 Pt will improve PSFS to >5/10 to show improved function with cooking and occupation Baseline: Goal status: ongoing 06/18/23 Pt will reduce pain to overall less than 2-3/10 with usual activity and work activity. Baseline: Goal status: MET 06/18/23 Pt will be able to ambulate community distances at least 552ft without AD and  WNL gait pattern without complaints Baseline: Goal status: MET 06/18/23 PLAN: PT FREQUENCY: 2-3 times per week   PT DURATION: 6-8 weeks  PLANNED INTERVENTIONS (unless contraindicated): aquatic PT, Canalith repositioning, cryotherapy, Electrical stimulation, Iontophoresis with 4 mg/ml dexamethasome, Moist heat, traction, Ultrasound, gait training, Therapeutic exercise, balance training, neuromuscular re-education, patient/family education, manual techniques, passive ROM, dry needling, taping, vasopnuematic device, vestibular, spinal manipulations, joint manipulations 97110-Therapeutic exercises, 97530- Therapeutic activity, 97112- Neuromuscular re-education, 97535- Self Care, and 51884- Manual therapy  PLAN FOR NEXT SESSION: Sci fit bike, Strengthening, balance improvements.  Knee flexion gains  Ivery Quale, PT, DPT 06/25/23 3:13 PM

## 2023-06-27 ENCOUNTER — Ambulatory Visit (INDEPENDENT_AMBULATORY_CARE_PROVIDER_SITE_OTHER): Payer: 59 | Admitting: Physical Therapy

## 2023-06-27 ENCOUNTER — Encounter: Payer: Self-pay | Admitting: Physical Therapy

## 2023-06-27 DIAGNOSIS — R262 Difficulty in walking, not elsewhere classified: Secondary | ICD-10-CM

## 2023-06-27 DIAGNOSIS — R600 Localized edema: Secondary | ICD-10-CM

## 2023-06-27 DIAGNOSIS — M6281 Muscle weakness (generalized): Secondary | ICD-10-CM

## 2023-06-27 DIAGNOSIS — M25561 Pain in right knee: Secondary | ICD-10-CM

## 2023-06-27 DIAGNOSIS — M25661 Stiffness of right knee, not elsewhere classified: Secondary | ICD-10-CM | POA: Diagnosis not present

## 2023-06-27 NOTE — Therapy (Addendum)
 OUTPATIENT PHYSICAL THERAPY TREATMENT   Patient Name: OMOLARA CAROL MRN: [id number] DOB:[birth year], 73 y.o., female Today's Date: 06/27/2023  END OF SESSION:  PT End of Session - 06/27/23 1516     Visit Number 8    Number of Visits 18    Date for PT Re-Evaluation 07/22/23    Authorization Type [employer] State and Medicare    Progress Note Due on Visit 15    PT Start Time 1430    PT Stop Time 1508    PT Time Calculation (min) 38 min    Activity Tolerance Patient tolerated treatment well;No increased pain    Behavior During Therapy WFL for tasks assessed/performed                Past Medical History:  Diagnosis Date   Anxiety    Arthritis    back    Back pain    Cataract    small immature   Depression    GERD (gastroesophageal reflux disease)    Hyperlipidemia    Hypertension    Spinal stenosis, lumbar region, without neurogenic claudication    Past Surgical History:  Procedure Laterality Date   30-Day Event Monitor  08/2016   15 events noted. Mostly sinus rhythm with occasional bradycardia. Low rate 50. Average rate 70 bpm. Rare PACs but no runs or couplets. No arrhythmia noted.   CHOLECYSTECTOMY     HAND SURGERY     fatty tissue removed    TOTAL KNEE ARTHROPLASTY Right 05/07/2023   Procedure: RIGHT TOTAL KNEE ARTHROPLASTY;  Surgeon: Kathryne Hitch, MD;  Location: [facility] OR;  Service: Orthopedics;  Laterality: Right;   TRANSTHORACIC ECHOCARDIOGRAM  12/2001; 09/2016   a.Normal LV size and function. EF 55-65%. Normal regional wall motion. No valvular lesions;; b. October 04, 2016: Normal LV size with mild LV hypertrophy. EF 55-60%. Mildly increased RV size with normal systolic function. No significant valvular abnormalities   VAGINAL HYSTERECTOMY  1983   Zio Patch Monitor  08/2021   Mostly sinus rhythm, occasional (3.3%) isolated PACs.  Multiple (157) runs of PAT: Fastest- 5&6 beats, max 184 bpm; longest 16 beats (7.7 Sec) avg rate 128 bpm; symptoms mostly noted  with sinus rhythm and PACs.  137 triggers most were not SVT/PAT episodes.   Patient Active Problem List   Diagnosis Date Noted   Status post total right knee replacement 05/07/2023   Lumbar radiculopathy 03/15/2021   Chronic back pain 07/09/2017   Essential hypertension 09/19/2016   Heart palpitations 09/19/2016   Systolic murmur 09/19/2016   Chronic bilateral low back pain with sciatica 09/10/2016   Lumbosacral root lesions 01/25/2014   Spinal stenosis, lumbar region, without neurogenic claudication 07/21/2012   Encounter for long-term (current) use of other medications 07/21/2012   Lumbosacral root lesions, not elsewhere classified 07/21/2012    PCP: Dot Been, FNP   REFERRING PROVIDER: Kathryne Hitch*   REFERRING DIAG: [phone] (ICD-10-CM) - Status post total right knee replacement  THERAPY DIAG:  Acute pain of right knee  Stiffness of right knee, not elsewhere classified  Difficulty in walking, not elsewhere classified  Localized edema  Muscle weakness (generalized)  Rationale for Evaluation and Treatment: Rehabilitation  ONSET DATE: s/p right TKA 05/07/23  SUBJECTIVE:   SUBJECTIVE STATEMENT:she has a little bit of pain and stiffness today in her knee  PERTINENT HISTORY: Post op status, sciatica and reported nerve damage in right foot PAIN:  NPRS scale: 1-2/10 today Pain location: Rt knee Pain description: achy,  sharp, zinging feeling like its healing  Aggravating factors: stairs, housework Relieving factors: prescription meds   PRECAUTIONS: None  RED FLAGS: None   WEIGHT BEARING RESTRICTIONS: No  FALLS:  Has patient fallen in last 6 months? 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  DIAGNOSTIC FINDINGS: 05/07/23 XR  IMPRESSION: Right knee arthroplasty without immediate postoperative complication.  PATIENT SURVEYS:  Patient-Specific Activity Scoring Scheme  "0" represents "unable to perform." "10" represents "able to perform at prior level. 0 1 2 3 4 5 6 7 8 9  10 (Date and Score)   Activity 05/27/2023    1. cooking  1    2.       3.     4.    5.     Total score = sum of the activity scores/number of activities Minimum detectable change (90%CI) for average score = 2 points Minimum detectable change (90%CI) for single activity score = 3 points   Score: 1/10 at eval, goal set at >5/10   COGNITION: 05/27/2023 Overall cognitive status: Within functional limits for tasks assessed     EDEMA:  05/27/2023 Mild to moderate edema in Rt knee  LOWER EXTREMITY ROM:  Active ROM/PROM Right 05/27/2023 Right 06/05/2023 Right 06/13/23 Right 06/18/23  Hip flexion      Hip extension      Hip abduction      Hip adduction      Hip internal rotation      Hip external rotation      Knee flexion AAROM 85 PROM 90 98 with belt P:100 A:94 P:103  Knee extension AROM 7 5 Active  3 AROM  Ankle dorsiflexion      Ankle plantarflexion      Ankle inversion      Ankle eversion       (Blank rows = not tested)  LOWER EXTREMITY MMT:  MMT tested in sitting Right 05/27/2023 Right 06/18/23  Hip flexion 4   Hip extension    Hip abduction 4   Hip adduction    Hip internal rotation    Hip external rotation    Knee flexion 4 5  Knee extension 4 4+  Ankle dorsiflexion    Ankle plantarflexion    Ankle inversion    Ankle eversion     (Blank rows = not tested)  GAIT: 05/27/2023 EVAL Distance walked: 50 feet with quad cane, 50 feet without cane Arrives with cane, has difficulty with sequencing and is able to ambulate without it.              TODAY'S TREATMENT:                                                                       DATE:  06/27/2023 Sci fit bike seat # 7 X 10 min full revolutions Seated  AAROM knee flexion (left pushes right into flexion) 10 x 5 seconds LAQ 4# 3X10 on Rt   Functional Activities (stairs, sit to stand): Leg press DL 44# 0N02, then Rt leg only 31#  2X12 reps Step ups forward onto 6 inch step leading with Rt, X 15 reps with one UE support Step ups lateral on 6 inch step with Rt, 15d reps with bilat UE suppport  Manual: Rt knee PROM with overpressure in sitting  06/25/2023 Sci fit bike seat # 6 X 10 min full revolutions Seated AAROM knee flexion (left pushes right into flexion) 10 x 5 seconds LAQ 4# 3X10 on Rt   Functional Activities (stairs, sit to stand): Leg press DL 40# 9W11, then Rt leg only 31# 2X12 reps Step ups forward onto 6 inch step leading with Rt, X 15 reps with one UE support Step ups lateral on 6 inch step with Rt, 15d reps with bilat UE suppport  Manual: Rt knee PROM with overpressure in sitting      PATIENT EDUCATION: Education details: HEP, PT plan of care Person educated: Patient Education method: Explanation, Demonstration, Verbal cues, and Handouts Education comprehension: verbalized understanding and needs further education   HOME EXERCISE PROGRAM: Access Code: [email or web address] URL: [email or web address] Date: 06/05/2023 Prepared by: Pauletta Browns  Exercises - Supine Heel Slide with Strap  - 2 x daily - 6 x weekly - 1 sets - 10 reps - 5 hold - Supine Quadricep Sets  - 2 x daily - 6 x weekly - 1 sets - 10 reps - 5 sec hold - Seated Long Arc Quad  - 2 x daily - 6 x weekly - 2 sets - 10 reps - Seated Knee Flexion Stretch  - 2 x daily - 6 x weekly - 1 sets - 10 reps - 5 sec hold - Seated Hamstring Stretch  - 2 x daily - 6 x weekly - 1 sets - 3 reps - 30 sec hold - Sit to Stand  - 2 x daily - 6 x weekly - 1 sets - 10 reps - Seated Knee Flexion AAROM  - 5 x daily - 7 x weekly - 1 sets - 1 reps - 3 minutes hold  ASSESSMENT:  CLINICAL IMPRESSION: Gait is back to normal, does still have some knee flexion stiffness that  we are working hard to improve with PT.  OBJECTIVE IMPAIRMENTS: decreased activity tolerance, difficulty walking, decreased balance, decreased endurance, decreased mobility, decreased ROM, decreased strength, impaired flexibility, impaired UE/LE use, and pain.  ACTIVITY LIMITATIONS: bending, lifting, carry, locomotion, cleaning, community activity, driving, and or occupation  PERSONAL FACTORS: see above PMH are also affecting patient's functional outcome.  REHAB POTENTIAL: Good  CLINICAL DECISION MAKING: Stable/uncomplicated  EVALUATION COMPLEXITY: Low    GOALS: Short term PT Goals Target date: 06/24/2023   Pt will be I and compliant with HEP. Baseline:  Goal status: MET 06/18/23  Long term PT goals Target date:07/22/2023   Pt will improve ROM to [facility] to improve functional mobility Baseline: Goal status:ongoing 06/18/23 Pt will improve  hip/knee strength to at least 5-/5 MMT to improve functional strength Baseline: Goal status: ongoing 06/18/23 Pt will improve PSFS to >5/10 to show improved function with cooking and occupation Baseline: Goal status: ongoing 06/18/23 Pt will reduce pain to overall less than 2-3/10 with usual activity and work activity. Baseline: Goal status: MET 06/18/23 Pt will be able to ambulate community distances at least 531ft without AD and  WNL gait pattern without complaints Baseline: Goal status: MET 06/18/23 PLAN: PT FREQUENCY: 2-3 times per week   PT DURATION: 6-8 weeks  PLANNED INTERVENTIONS (unless contraindicated): aquatic PT, Canalith repositioning, cryotherapy, Electrical stimulation, Iontophoresis with 4 mg/ml dexamethasome, Moist heat,  traction, Ultrasound, gait training, Therapeutic exercise, balance training, neuromuscular re-education, patient/family education, manual techniques, passive ROM, dry needling, taping, vasopnuematic device, vestibular, spinal manipulations, joint manipulations 97110-Therapeutic exercises, 97530- Therapeutic  activity, [id number]- Neuromuscular re-education, 97535- Self Care, and [id number]- Manual therapy  PLAN FOR NEXT SESSION: Sci fit bike, Strengthening, balance improvements.  Knee flexion gains  Ivery Quale, PT, DPT 06/27/23 3:16 PM

## 2023-07-02 ENCOUNTER — Ambulatory Visit (INDEPENDENT_AMBULATORY_CARE_PROVIDER_SITE_OTHER): Payer: 59 | Admitting: Physical Therapy

## 2023-07-02 ENCOUNTER — Encounter: Payer: Self-pay | Admitting: Physical Therapy

## 2023-07-02 DIAGNOSIS — R600 Localized edema: Secondary | ICD-10-CM

## 2023-07-02 DIAGNOSIS — R262 Difficulty in walking, not elsewhere classified: Secondary | ICD-10-CM

## 2023-07-02 DIAGNOSIS — M25661 Stiffness of right knee, not elsewhere classified: Secondary | ICD-10-CM | POA: Diagnosis not present

## 2023-07-02 DIAGNOSIS — M25561 Pain in right knee: Secondary | ICD-10-CM

## 2023-07-02 DIAGNOSIS — M6281 Muscle weakness (generalized): Secondary | ICD-10-CM

## 2023-07-02 NOTE — Therapy (Signed)
 OUTPATIENT PHYSICAL THERAPY TREATMENT/Discharge PHYSICAL THERAPY DISCHARGE SUMMARY  Visits from Start of Care: 9  Current functional level related to goals / functional outcomes: See below   Remaining deficits: See below   Education / Equipment: HEP  Plan:  Patient goals were almost all met. Patient is being discharged at her request as she feels ready to finish up with PT.      Patient Name: Morgan Swanson MRN: [id number] DOB:1950/06/16, 73 y.o., female Today's Date: 07/02/2023  END OF SESSION:  PT End of Session - 07/02/23 1441     Visit Number 9    Number of Visits 18    Date for PT Re-Evaluation 07/22/23    Authorization Type [employer] State and Medicare    Progress Note Due on Visit 15    PT Start Time 1430    PT Stop Time 1508    PT Time Calculation (min) 38 min    Activity Tolerance Patient tolerated treatment well;No increased pain    Behavior During Therapy WFL for tasks assessed/performed                Past Medical History:  Diagnosis Date   Anxiety    Arthritis    back    Back pain    Cataract    small immature   Depression    GERD (gastroesophageal reflux disease)    Hyperlipidemia    Hypertension    Spinal stenosis, lumbar region, without neurogenic claudication    Past Surgical History:  Procedure Laterality Date   30-Day Event Monitor  08/2016   15 events noted. Mostly sinus rhythm with occasional bradycardia. Low rate 50. Average rate 70 bpm. Rare PACs but no runs or couplets. No arrhythmia noted.   CHOLECYSTECTOMY     HAND SURGERY     fatty tissue removed    TOTAL KNEE ARTHROPLASTY Right 05/07/2023   Procedure: RIGHT TOTAL KNEE ARTHROPLASTY;  Surgeon: Kathryne Hitch, MD;  Location: [facility] OR;  Service: Orthopedics;  Laterality: Right;   TRANSTHORACIC ECHOCARDIOGRAM  12/2001; 09/2016   a.Normal LV size and function. EF 55-65%. Normal regional wall motion. No valvular lesions;; b. October 04, 2016: Normal LV size with mild LV  hypertrophy. EF 55-60%. Mildly increased RV size with normal systolic function. No significant valvular abnormalities   VAGINAL HYSTERECTOMY  1983   Zio Patch Monitor  08/2021   Mostly sinus rhythm, occasional (3.3%) isolated PACs.  Multiple (157) runs of PAT: Fastest- 5&6 beats, max 184 bpm; longest 16 beats (7.7 Sec) avg rate 128 bpm; symptoms mostly noted with sinus rhythm and PACs.  137 triggers most were not SVT/PAT episodes.   Patient Active Problem List   Diagnosis Date Noted   Status post total right knee replacement 05/07/2023   Lumbar radiculopathy 03/15/2021   Chronic back pain 07/09/2017   Essential hypertension 09/19/2016   Heart palpitations 09/19/2016   Systolic murmur 09/19/2016   Chronic bilateral low back pain with sciatica 09/10/2016   Lumbosacral root lesions 01/25/2014   Spinal stenosis, lumbar region, without neurogenic claudication 07/21/2012   Encounter for long-term (current) use of other medications 07/21/2012   Lumbosacral root lesions, not elsewhere classified 07/21/2012    PCP: Dot Been, FNP   REFERRING PROVIDER: Kathryne Hitch*   REFERRING DIAG: [phone] (ICD-10-CM) - Status post total right knee replacement  THERAPY DIAG:  Acute pain of right knee  Stiffness of right knee, not elsewhere classified  Difficulty in walking, not elsewhere classified  Localized edema  Muscle weakness (generalized)  Rationale for Evaluation and Treatment: Rehabilitation  ONSET DATE: s/p right TKA 05/07/23  SUBJECTIVE:   SUBJECTIVE STATEMENT:she is not having much knee pain  PERTINENT HISTORY: Post op status, sciatica and reported nerve damage in right foot PAIN:  NPRS scale: 1-2/10 today Pain location: Rt knee Pain description: achy, sharp, zinging feeling like its healing  Aggravating factors: stairs, housework Relieving factors: prescription meds   PRECAUTIONS: None  RED FLAGS: None   WEIGHT BEARING RESTRICTIONS: No  FALLS:  Has  patient fallen in last 6 months? 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  DIAGNOSTIC FINDINGS: 05/07/23 XR IMPRESSION: Right knee arthroplasty without immediate postoperative complication.  PATIENT SURVEYS:  Patient-Specific Activity Scoring Scheme  "0" represents "unable to perform." "10" represents "able to perform at prior level. 0 1 2 3 4 5 6 7 8 9  10 (Date and Score)   Activity 05/27/2023  07/02/23  1. cooking  1  9  2.       3.     4.    5.     Total score = sum of the activity scores/number of activities Minimum detectable change (90%CI) for average score = 2 points Minimum detectable change (90%CI) for single activity score = 3 points   Score: 1/10 at eval, goal set at >5/10  07/02/43, score improved    COGNITION: 05/27/2023 Overall cognitive status: Within functional limits for tasks assessed     EDEMA:  05/27/2023 Mild to moderate edema in Rt knee  LOWER EXTREMITY ROM:  Active ROM/PROM Right 05/27/2023 Right 06/05/2023 Right 06/13/23 Right 06/18/23 Right 07/02/23  Hip flexion       Hip extension       Hip abduction       Hip adduction       Hip internal rotation       Hip external rotation       Knee flexion AAROM 85 PROM 90 98 with belt P:100 A:94 P:103 A:100 P:107  Knee extension AROM 7 5 Active  3 AROM A:3 P:0  Ankle dorsiflexion       Ankle plantarflexion       Ankle inversion       Ankle eversion        (Blank rows = not tested)  LOWER EXTREMITY MMT:  MMT tested in sitting Right 05/27/2023 Right 06/18/23 Right 4/125  Hip flexion 4    Hip extension     Hip abduction 4    Hip adduction     Hip internal rotation     Hip external rotation     Knee flexion 4 5 5   Knee extension 4 4+ 5  Ankle dorsiflexion     Ankle  plantarflexion     Ankle inversion     Ankle eversion      (Blank rows = not tested)  GAIT: 05/27/2023 EVAL Distance walked: 50 feet with quad cane, 50 feet without cane Arrives with cane, has difficulty with sequencing and is able to ambulate without it. 07/02/23: Gait WNL without assistance            TODAY'S TREATMENT:  DATE:  07/02/2023 Sci fit bike seat # 7 X 10 min full revolutions Seated AAROM knee flexion (left pushes right into flexion) 10 x 5 seconds LAQ 4# 3X10 on Rt Heel prop with quad sets X 10 Supine heelslides AAROM 5 sec X 10 with strap HEP review  Functional Activities (stairs, sit to stand): Sit to stands no UE support X 10 reps  Step ups forward onto 6 inch step leading with Rt, and down in front with left, X 10 reps with one UE support Step ups lateral on 6 inch step with Rt, 15 reps with bilat UE suppport  Manual: Rt knee PROM with overpressure flexion and extension  06/27/2023 Sci fit bike seat # 6 X 10 min full revolutions Seated AAROM knee flexion (left pushes right into flexion) 10 x 5 seconds LAQ 4# 3X10 on Rt   Functional Activities (stairs, sit to stand): Leg press DL 16# 1W96, then Rt leg only 31# 2X12 reps Step ups forward onto 6 inch step leading with Rt, X 15 reps with one UE support Step ups lateral on 6 inch step with Rt, 15d reps with bilat UE suppport  Manual: Rt knee PROM with overpressure in sitting      PATIENT EDUCATION: Education details: HEP, PT plan of care Person educated: Patient Education method: Explanation, Demonstration, Verbal cues, and Handouts Education comprehension: verbalized understanding and needs further education   HOME EXERCISE PROGRAM: Access Code: [email or web address] URL: [email or web address] Date: 07/02/2023 Prepared by: Ivery Quale  Exercises - Supine Heel Slide with Strap  - 2 x daily - 6 x weekly - 1 sets - 10 reps - 5  hold - Seated Knee Flexion Stretch  - 2 x daily - 6 x weekly - 1 sets - 10 reps - 5 sec hold - Heel Prop  - 2 x daily - 6 x weekly - 1 sets - 10 reps - 5 sec hold hold - Sit to Stand Without Arm Support  - 2 x daily - 6 x weekly - 1 sets - 10 reps  ASSESSMENT:  CLINICAL IMPRESSION: Patient goals were almost all met. Patient is being discharged at her request as she feels ready to finish up with PT. She has good strength and can walk without assistance, she is not having pain. Only missing slight ROM and I printed out new HEP to focus on this. She understands that she still needs to gain a little more ROM so she was hight encouraged to continue with HEP at home and that it is extremely important to work on this ROM on her own now that she will not be coming back to PT.  OBJECTIVE IMPAIRMENTS: decreased activity tolerance, difficulty walking, decreased balance, decreased endurance, decreased mobility, decreased ROM, decreased strength, impaired flexibility, impaired UE/LE use, and pain.  ACTIVITY LIMITATIONS: bending, lifting, carry, locomotion, cleaning, community activity, driving, and or occupation  PERSONAL FACTORS: see above PMH are also affecting patient's functional outcome.  REHAB POTENTIAL: Good  CLINICAL DECISION MAKING: Stable/uncomplicated  EVALUATION COMPLEXITY: Low    GOALS: Short term PT Goals Target date: 06/24/2023   Pt will be I and compliant with HEP. Baseline:  Goal status: MET 06/18/23  Long term PT goals Target date:07/22/2023   Pt will improve ROM to [facility] to improve functional mobility Baseline: Goal status:almost met, PROM 0-107, AROM 3-100 07/02/23 Pt will improve  hip/knee strength to at least 5-/5 MMT to improve functional strength Baseline: Goal status: MET 07/02/23 Pt will improve PSFS to >5/10 to  show improved function with cooking and occupation Baseline: Goal status: MET 07/02/23 Pt will reduce pain to overall less than 2-3/10 with usual activity and  work activity. Baseline: Goal status: MET 06/18/23 Pt will be able to ambulate community distances at least 569ft without AD and  WNL gait pattern without complaints Baseline: Goal status: MET 07/02/23 PLAN: PT FREQUENCY: 2-3 times per week   PT DURATION: 6-8 weeks  PLANNED INTERVENTIONS (unless contraindicated): aquatic PT, Canalith repositioning, cryotherapy, Electrical stimulation, Iontophoresis with 4 mg/ml dexamethasome, Moist heat, traction, Ultrasound, gait training, Therapeutic exercise, balance training, neuromuscular re-education, patient/family education, manual techniques, passive ROM, dry needling, taping, vasopnuematic device, vestibular, spinal manipulations, joint manipulations 97110-Therapeutic exercises, 97530- Therapeutic activity, [id number]- Neuromuscular re-education, 97535- Self Care, and 16109- Manual therapy  PLAN FOR NEXT SESSION: DC today with max encouragement to continue with HEP  Ivery Quale, PT, DPT 07/02/23 2:41 PM

## 2023-07-04 ENCOUNTER — Encounter: Payer: 59 | Admitting: Rehabilitative and Restorative Service Providers"

## 2023-07-09 ENCOUNTER — Encounter: Admitting: Rehabilitative and Restorative Service Providers"

## 2023-07-11 ENCOUNTER — Encounter: Admitting: Rehabilitative and Restorative Service Providers"

## 2023-07-31 ENCOUNTER — Other Ambulatory Visit (INDEPENDENT_AMBULATORY_CARE_PROVIDER_SITE_OTHER): Payer: Self-pay

## 2023-07-31 ENCOUNTER — Ambulatory Visit (INDEPENDENT_AMBULATORY_CARE_PROVIDER_SITE_OTHER): Admitting: Orthopaedic Surgery

## 2023-07-31 ENCOUNTER — Encounter: Payer: Self-pay | Admitting: Orthopaedic Surgery

## 2023-07-31 DIAGNOSIS — Z96651 Presence of right artificial knee joint: Secondary | ICD-10-CM

## 2023-07-31 NOTE — Progress Notes (Signed)
 The patient is a 73 year old female who is now almost 3 months out from a right total knee arthroplasty.  She reports good range of motion and strength.  She says it stiff in the morning but overall she is doing well.  She denies any issues significantly with her left knee.  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  She does stand a lot at work she states.  Examination of her right operative knee shows that still has some warmth and swelling to be expected.  Her extension is full and I can flex her to about 95 to 100 degrees.  She was very satisfied with this.  2 views of the right knee show well-seated cemented total knee arthroplasty with no complicating features.  I did give her a note to return to work starting next week.  There are no restrictions.  From our standpoint we will see her back in 6 months with a repeat AP and lateral of her right knee.  If there are issues before then she knows to reach out and let us  know.

## 2023-10-28 ENCOUNTER — Other Ambulatory Visit: Payer: Self-pay | Admitting: Family Medicine

## 2023-10-28 DIAGNOSIS — Z1231 Encounter for screening mammogram for malignant neoplasm of breast: Secondary | ICD-10-CM

## 2023-11-12 ENCOUNTER — Other Ambulatory Visit: Payer: Self-pay | Admitting: Orthopaedic Surgery

## 2023-11-14 ENCOUNTER — Telehealth: Payer: Self-pay | Admitting: *Deleted

## 2023-11-14 NOTE — Telephone Encounter (Signed)
 Spoke to patient to confirm upcoming afternoon [facility] appointment on 8/20, paperwork will be sent via [employer].  Gave location and time, also informed patient that the surgeon's office would be calling as well to get information from them similar to the packet that they will be receiving so make sure to do both.  Reminded patient that all providers will be coming to the clinic to see them HERE and if they had any questions to not hesitate to reach back out to myself or their navigators.

## 2023-11-18 ENCOUNTER — Other Ambulatory Visit: Payer: Self-pay | Admitting: *Deleted

## 2023-11-18 ENCOUNTER — Encounter: Payer: Self-pay | Admitting: *Deleted

## 2023-11-18 DIAGNOSIS — C50412 Malignant neoplasm of upper-outer quadrant of left female breast: Secondary | ICD-10-CM | POA: Insufficient documentation

## 2023-11-19 NOTE — Progress Notes (Signed)
 Radiation Oncology         [phone] ________________________________  Initial Outpatient Consultation  Name: Morgan Swanson MRN: [id number]  Date: 11/20/2023  DOB: 1950-12-11  RR:Bjbj, Johnston PARAS, FNP  Aron Shoulders, MD   REFERRING PHYSICIAN: Aron Shoulders, MD  DIAGNOSIS: No diagnosis found.   Cancer Staging  No matching staging information was found for the patient.   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 female who presented with breast abnormality on the following imaging: *** on the date of ***.  Symptoms, if any, at that time, were: ***.   Ultrasound of breast on *** revealed ***.   Biopsy on date of *** showed ***.  ER status: ***; PR status ***, Her2 status ***; Grade ***.  ***  PREVIOUS RADIATION THERAPY: {EXAM; YES/NO:19492::No}  PAST MEDICAL HISTORY:  has a past medical history of Anxiety, Arthritis, Back pain, Cataract, Depression, GERD (gastroesophageal reflux disease), Hyperlipidemia, Hypertension, and Spinal stenosis, lumbar region, without neurogenic claudication.    PAST SURGICAL HISTORY: Past Surgical History:  Procedure Laterality Date   30-Day Event Monitor  08/2016   15 events noted. Mostly sinus rhythm with occasional bradycardia. Low rate 50. Average rate 70 bpm. Rare PACs but no runs or couplets. No arrhythmia noted.   CHOLECYSTECTOMY     HAND SURGERY     fatty tissue removed    TOTAL KNEE ARTHROPLASTY Right 05/07/2023   Procedure: RIGHT TOTAL KNEE ARTHROPLASTY;  Surgeon: Vernetta Lonni GRADE, MD;  Location: [facility] OR;  Service: Orthopedics;  Laterality: Right;   TRANSTHORACIC ECHOCARDIOGRAM  12/2001; 09/2016   a.Normal LV size and function. EF 55-65%. Normal regional wall motion. No valvular lesions;; b. October 04, 2016: Normal LV size with mild LV hypertrophy. EF 55-60%. Mildly  increased RV size with normal systolic function. No significant valvular abnormalities   VAGINAL HYSTERECTOMY  1983   Zio Patch Monitor  08/2021   Mostly sinus rhythm, occasional (3.3%) isolated PACs.  Multiple (157) runs of PAT: Fastest- 5&6 beats, max 184 bpm; longest 16 beats (7.7 Sec) avg rate 128 bpm; symptoms mostly noted with sinus rhythm and PACs.  137 triggers most were not SVT/PAT episodes.    FAMILY HISTORY: family history includes Breast cancer in her mother; Diabetes in her mother; Fibromyalgia in her mother; Lung cancer in her father; Stroke in her brother.  SOCIAL HISTORY:  reports that she quit smoking about 24 years ago. Her smoking use included cigarettes. She has never used smokeless tobacco. She reports current alcohol use of about 1.0 standard drink of alcohol per week. She reports that she does not use drugs.  ALLERGIES: Lisinopril, Penicillins, and Codeine  MEDICATIONS:  Current Outpatient Medications  Medication Sig Dispense Refill   amLODipine  (NORVASC ) 10 MG tablet Take 1 tablet by mouth daily.     aspirin  81 MG chewable tablet Chew 1 tablet (81 mg total) by mouth 2 (two) times daily. 30 tablet 0   Aspirin -Caffeine (BC FAST PAIN RELIEF PO) Take 1 packet by mouth daily as needed (pain).     baclofen  (LIORESAL ) 10 MG tablet Take 1 tablet (10 mg total) by mouth at bedtime as needed for muscle spasms. (Patient taking differently: Take 10 mg by mouth at bedtime.) 30 each 3   busPIRone  (BUSPAR ) 15 MG tablet Take 15 mg by mouth 2 (two) times  daily.     CRESTOR  20 MG tablet Take 1 tablet by mouth daily.     fentaNYL  (DURAGESIC ) 12 MCG/HR Place 1 patch onto the skin every 3 (three) days. Patient has drug agreement, 10 patch 0   gabapentin  (NEURONTIN ) 100 MG capsule Take 1 capsule (100 mg total) by mouth 3 (three) times daily. 270 capsule 4   metoprolol  tartrate (LOPRESSOR ) 25 MG tablet TAKE 1 TABLET(25 MG) BY MOUTH TWICE DAILY 180 tablet 0   omeprazole (PRILOSEC) 40 MG  capsule Take 1 capsule by mouth daily.     oxyCODONE  (OXY IR/ROXICODONE ) 5 MG immediate release tablet Take 1-2 tablets (5-10 mg total) by mouth every 6 (six) hours as needed for moderate pain (pain score 4-6) (pain score 4-6). 30 tablet 0   sertraline  (ZOLOFT ) 25 MG tablet Take 25 mg by mouth daily.     tiZANidine  (ZANAFLEX ) 4 MG tablet Take 1 tablet (4 mg total) by mouth every 6 (six) hours as needed for muscle spasms. 30 tablet 0   triamterene -hydrochlorothiazide  (MAXZIDE -25) 37.5-25 MG tablet Take 0.5 tablets by mouth daily.  0   No current facility-administered medications for this encounter.    REVIEW OF SYSTEMS: As above in HPI.   PHYSICAL EXAM:  vitals were not taken for this visit.   General: Alert and oriented, in no acute distress HEENT: Head is normocephalic. Extraocular movements are intact.  Heart: Regular in rate and rhythm with no murmurs, rubs, or gallops. Chest: Clear to auscultation bilaterally, with no rhonchi, wheezes, or rales. Abdomen: Soft, nontender, nondistended, with no rigidity or guarding. Extremities: No cyanosis or edema. Skin: No concerning lesions. Musculoskeletal: symmetric strength and muscle tone throughout. Neurologic: Cranial nerves II through XII are grossly intact. No obvious focalities. Speech is fluent. Coordination is intact. Psychiatric: Judgment and insight are intact. Affect is appropriate. Breasts: *** . No other palpable masses appreciated in the breasts or axillae *** .    ECOG = ***  0 - Asymptomatic (Fully active, able to carry on all predisease activities without restriction)  1 - Symptomatic but completely ambulatory (Restricted in physically strenuous activity but ambulatory and able to carry out work of a light or sedentary nature. For example, light housework, office work)  2 - Symptomatic, <50% in bed during the day (Ambulatory and capable of all self care but unable to carry out any work activities. Up and about more than 50%  of waking hours)  3 - Symptomatic, >50% in bed, but not bedbound (Capable of only limited self-care, confined to bed or chair 50% or more of waking hours)  4 - Bedbound (Completely disabled. Cannot carry on any self-care. Totally confined to bed or chair)  5 - Death   Raylene MM, Creech RH, Tormey DC, et al. [phone]). Toxicity and response criteria of the [facility]. Am. DOROTHA Bridges. Oncol. 5 (6): 649-55   LABORATORY DATA:   CBC    Component Value Date/Time   WBC 9.4 05/08/2023 0551   RBC 3.99 05/08/2023 0551   HGB 11.9 (L) 05/08/2023 0551   HCT 36.3 05/08/2023 0551   PLT 302 05/08/2023 0551   MCV 91.0 05/08/2023 0551   MCH 29.8 05/08/2023 0551   MCHC 32.8 05/08/2023 0551   RDW 13.1 05/08/2023 0551   MONOABS 0.5 12/11/2006 1553   EOSABS 0.1 12/11/2006 1553   BASOSABS 0.0 12/11/2006 1553    CMP     Component Value Date/Time   NA 138 05/08/2023 0551   K 4.2  05/08/2023 0551   CL 101 05/08/2023 0551   CO2 26 05/08/2023 0551   GLUCOSE 117 (H) 05/08/2023 0551   BUN 13 05/08/2023 0551   CREATININE 0.75 05/08/2023 0551   CALCIUM  9.3 05/08/2023 0551   GFRNONAA >60 05/08/2023 0551      RADIOGRAPHY: No results found.    IMPRESSION/PLAN: ***   It was a pleasure meeting the patient today. We discussed the risks, benefits, and side effects of radiotherapy. I recommend radiotherapy to the *** to reduce her risk of locoregional recurrence by 2/3.  We discussed that radiation would take approximately *** weeks to complete and that I would give the patient a few weeks to heal following surgery before starting treatment planning. *** If chemotherapy were to be given, this would precede radiotherapy. We spoke about acute effects including skin irritation and fatigue as well as much less common late effects including internal organ injury or irritation. We spoke about the latest technology that is used to minimize the risk of late effects for patients undergoing  radiotherapy to the breast or chest wall. No guarantees of treatment were given. The patient is enthusiastic about proceeding with treatment. I look forward to participating in the patient's care.  I will await her referral back to me for postoperative follow-up and eventual CT simulation/treatment planning.  On date of service, in total, I spent *** minutes on this encounter. Patient was seen in person.   __________________________________________   Lauraine Golden, MD  This document serves as a record of services personally performed by Lauraine Golden, MD. It was created on her behalf by Dorthy Fuse, a trained medical scribe. The creation of this record is based on the scribe's personal observations and the provider's statements to them. This document has been checked and approved by the attending provider.

## 2023-11-20 ENCOUNTER — Telehealth: Payer: Self-pay | Admitting: Genetic Counselor

## 2023-11-20 ENCOUNTER — Inpatient Hospital Stay (HOSPITAL_BASED_OUTPATIENT_CLINIC_OR_DEPARTMENT_OTHER): Admitting: Hematology and Oncology

## 2023-11-20 ENCOUNTER — Encounter: Payer: Self-pay | Admitting: *Deleted

## 2023-11-20 ENCOUNTER — Other Ambulatory Visit: Payer: Self-pay | Admitting: General Surgery

## 2023-11-20 ENCOUNTER — Ambulatory Visit
Admission: RE | Admit: 2023-11-20 | Discharge: 2023-11-20 | Disposition: A | Discharge status: Home / Self Care | Source: Ambulatory Visit | Attending: Radiation Oncology | Admitting: Radiation Oncology

## 2023-11-20 ENCOUNTER — Inpatient Hospital Stay: Attending: Hematology and Oncology

## 2023-11-20 ENCOUNTER — Ambulatory Visit: Admitting: Physical Therapy

## 2023-11-20 VITALS — BP 117/56 | HR 51 | Temp 97.7°F | Resp 16 | Wt 150.4 lb

## 2023-11-20 DIAGNOSIS — C50412 Malignant neoplasm of upper-outer quadrant of left female breast: Secondary | ICD-10-CM | POA: Insufficient documentation

## 2023-11-20 DIAGNOSIS — Z170 Estrogen receptor positive status [ER+]: Secondary | ICD-10-CM

## 2023-11-20 DIAGNOSIS — Z1721 Progesterone receptor positive status: Secondary | ICD-10-CM | POA: Diagnosis not present

## 2023-11-20 DIAGNOSIS — Z803 Family history of malignant neoplasm of breast: Secondary | ICD-10-CM | POA: Diagnosis not present

## 2023-11-20 DIAGNOSIS — Z87891 Personal history of nicotine dependence: Secondary | ICD-10-CM | POA: Diagnosis not present

## 2023-11-20 DIAGNOSIS — Z1732 Human epidermal growth factor receptor 2 negative status: Secondary | ICD-10-CM | POA: Insufficient documentation

## 2023-11-20 DIAGNOSIS — Z801 Family history of malignant neoplasm of trachea, bronchus and lung: Secondary | ICD-10-CM | POA: Diagnosis not present

## 2023-11-20 LAB — CBC WITH DIFFERENTIAL (CANCER CENTER ONLY)
Abs Immature Granulocytes: 0.01 K/uL (ref 0.00–0.07)
Basophils Absolute: 0 K/uL (ref 0.0–0.1)
Basophils Relative: 1 %
Eosinophils Absolute: 0.1 K/uL (ref 0.0–0.5)
Eosinophils Relative: 2 %
HCT: 36.9 % (ref 36.0–46.0)
Hemoglobin: 12.2 g/dL (ref 12.0–15.0)
Immature Granulocytes: 0 %
Lymphocytes Relative: 40 %
Lymphs Abs: 1.6 K/uL (ref 0.7–4.0)
MCH: 29.8 pg (ref 26.0–34.0)
MCHC: 33.1 g/dL (ref 30.0–36.0)
MCV: 90.2 fL (ref 80.0–100.0)
Monocytes Absolute: 0.3 K/uL (ref 0.1–1.0)
Monocytes Relative: 8 %
Neutro Abs: 1.9 K/uL (ref 1.7–7.7)
Neutrophils Relative %: 49 %
Platelet Count: 288 K/uL (ref 150–400)
RBC: 4.09 MIL/uL (ref 3.87–5.11)
RDW: 13.5 % (ref 11.5–15.5)
WBC Count: 3.9 K/uL — ABNORMAL LOW (ref 4.0–10.5)
nRBC: 0 % (ref 0.0–0.2)

## 2023-11-20 LAB — CMP (CANCER CENTER ONLY)
ALT: 12 U/L (ref 0–44)
AST: 19 U/L (ref 15–41)
Albumin: 4.5 g/dL (ref 3.5–5.0)
Alkaline Phosphatase: 78 U/L (ref 38–126)
Anion gap: 7 (ref 5–15)
BUN: 17 mg/dL (ref 8–23)
CO2: 28 mmol/L (ref 22–32)
Calcium: 9.4 mg/dL (ref 8.9–10.3)
Chloride: 104 mmol/L (ref 98–111)
Creatinine: 1.02 mg/dL — ABNORMAL HIGH (ref 0.44–1.00)
GFR, Estimated: 57 mL/min — ABNORMAL LOW (ref >60)
Glucose, Bld: 81 mg/dL (ref 70–99)
Potassium: 3.6 mmol/L (ref 3.5–5.1)
Sodium: 139 mmol/L (ref 135–145)
Total Bilirubin: 0.4 mg/dL (ref 0.0–1.2)
Total Protein: 7.8 g/dL (ref 6.5–8.1)

## 2023-11-20 LAB — GENETIC SCREENING ORDER

## 2023-11-20 NOTE — Progress Notes (Signed)
 [facility] CONSULT NOTE  Patient Care Team: Earvin Johnston PARAS, FNP as PCP - General (Family Medicine) Anner Alm ORN, MD as PCP - Cardiology (Cardiology) Tyree Nanetta SAILOR, RN as Oncology Nurse Navigator Gerome, Devere HERO, RN as Oncology Nurse Navigator Aron Shoulders, MD as Consulting Physician (General Surgery) Odean Potts, MD as Consulting Physician (Hematology and Oncology) Izell Domino, MD as Attending Physician (Radiation Oncology)  CHIEF COMPLAINTS/PURPOSE OF CONSULTATION:  Newly diagnosed breast cancer  HISTORY OF PRESENTING ILLNESS:  History of Present Illness Morgan Swanson is a 73 year old female who presents for oncology consultation following a routine mammogram finding of invasive ductal carcinoma.  A routine mammogram revealed a disturbance in the breast, leading to an ultrasound and biopsy. The biopsy confirmed invasive ductal carcinoma, with a tumor size of 0.6 centimeters. No lymph node enlargement is noted. The cancer cells are 100% positive for estrogen receptors and 1% positive for progesterone receptors, with HER2 being negative. The tumor is classified as grade 2, with a KI-67 proliferation index of 5%. There is a family history of breast cancer; her mother had breast cancer later in life but lived until the age of 68.  I reviewed her records extensively and collaborated the history with the patient.  SUMMARY OF ONCOLOGIC HISTORY: Oncology History  Malignant neoplasm of upper-outer quadrant of left breast in female, estrogen receptor positive (HCC)  11/18/2023 Initial Diagnosis   Screening mammogram detected focal asymmetry in the left breast 5 mm by ultrasound measured 0.6 cm at 1 o'clock position 6 cm from the nipple.  Biopsy revealed grade 2 IDC no LVI, ER 100%, PR 1%, HER2 negative, Ki67 5%      MEDICAL HISTORY:  Past Medical History:  Diagnosis Date   Anxiety    Arthritis    back    Back pain    Cataract    small immature    Depression    GERD (gastroesophageal reflux disease)    Hyperlipidemia    Hypertension    Spinal stenosis, lumbar region, without neurogenic claudication     SURGICAL HISTORY: Past Surgical History:  Procedure Laterality Date   30-Day Event Monitor  08/2016   15 events noted. Mostly sinus rhythm with occasional bradycardia. Low rate 50. Average rate 70 bpm. Rare PACs but no runs or couplets. No arrhythmia noted.   CHOLECYSTECTOMY     HAND SURGERY     fatty tissue removed    TOTAL KNEE ARTHROPLASTY Right 05/07/2023   Procedure: RIGHT TOTAL KNEE ARTHROPLASTY;  Surgeon: Vernetta Lonni GRADE, MD;  Location: [facility] OR;  Service: Orthopedics;  Laterality: Right;   TRANSTHORACIC ECHOCARDIOGRAM  12/2001; 09/2016   a.Normal LV size and function. EF 55-65%. Normal regional wall motion. No valvular lesions;; b. October 04, 2016: Normal LV size with mild LV hypertrophy. EF 55-60%. Mildly increased RV size with normal systolic function. No significant valvular abnormalities   VAGINAL HYSTERECTOMY  1983   Zio Patch Monitor  08/2021   Mostly sinus rhythm, occasional (3.3%) isolated PACs.  Multiple (157) runs of PAT: Fastest- 5&6 beats, max 184 bpm; longest 16 beats (7.7 Sec) avg rate 128 bpm; symptoms mostly noted with sinus rhythm and PACs.  137 triggers most were not SVT/PAT episodes.    SOCIAL HISTORY: Social History   Socioeconomic History   Marital status: Single    Spouse name: Not on file   Number of children: 2   Years of education: HS   Highest education level: Not on file  Occupational History   Occupation: Multimedia programmer: [employer]  Tobacco Use   Smoking status: Former    Current packs/day: 0.00    Types: Cigarettes    Quit date: 04/03/1999    Years since quitting: 24.6   Smokeless tobacco: Never  Vaping Use   Vaping status: Never Used  Substance and Sexual Activity   Alcohol use: Yes    Alcohol/week: 1.0 standard drink of alcohol    Types: 1 Glasses of wine per week     Comment: occ Holidays   Drug use: No   Sexual activity: Not on file  Other Topics Concern   Not on file  Social History Narrative   Pt lives at home with her four teenage grandchildren - has legal custody.    Patient is single.   Works for [employer] as a [employer].   Caffeine Use- 2 cups of tea daily.   Right handed.   Social Drivers of Health   Financial Resource Strain: Not at Risk (10/28/2023)   Received from [employer]    How hard is it for you to pay for the very basics like food, housing, heating, medical care, and medications?: 1  Food Insecurity: No Food Insecurity (11/20/2023)   Hunger Vital Sign    Worried About Running Out of Food in the Last Year: Never true    Ran Out of Food in the Last Year: Never true  Transportation Needs: No Transportation Needs (11/20/2023)   PRAPARE - Administrator, Civil Service (Medical): No    Lack of Transportation (Non-Medical): No  Physical Activity: At Risk (10/28/2023)   Received from [facility]   Physical Activity    Weekly Physical Activity: 2  Stress: Not at Risk (10/28/2023)   Received from [facility]   Stress    Do you feel these kinds of stress these days?: 1  Social Connections: Not at Risk (10/28/2023)   Received from [facility]   Social Connections    How often do you see or talk to people that you care about and feel close to? (For example: talking to friends on phone, visiting friends or family, going to church or club meetings): 1  Intimate Partner Violence: Not At Risk (11/20/2023)   Humiliation, Afraid, Rape, and Kick questionnaire    Fear of Current or Ex-Partner: No    Emotionally Abused: No    Physically Abused: No    Sexually Abused: No    FAMILY HISTORY: Family History  Problem Relation Age of Onset   Breast cancer Mother    Diabetes Mother    Fibromyalgia Mother    Lung cancer Father    Stroke Brother    Colon cancer Neg Hx    Colon polyps Neg Hx    Esophageal  cancer Neg Hx    Rectal cancer Neg Hx    Stomach cancer Neg Hx     ALLERGIES:  is allergic to lisinopril, penicillins, and codeine.  MEDICATIONS:  Current Outpatient Medications  Medication Sig Dispense Refill   amLODipine  (NORVASC ) 10 MG tablet Take 1 tablet by mouth daily.     aspirin  81 MG chewable tablet Chew 1 tablet (81 mg total) by mouth 2 (two) times daily. 30 tablet 0   Aspirin -Caffeine (BC FAST PAIN RELIEF PO) Take 1 packet by mouth daily as needed (pain).     baclofen  (LIORESAL ) 10 MG tablet Take 1 tablet (10 mg total) by mouth at bedtime  as needed for muscle spasms. 30 each 3   busPIRone  (BUSPAR ) 15 MG tablet Take 15 mg by mouth 2 (two) times daily.     CRESTOR  20 MG tablet Take 1 tablet by mouth daily.     fentaNYL  (DURAGESIC ) 12 MCG/HR Place 1 patch onto the skin every 3 (three) days. Patient has drug agreement, 10 patch 0   gabapentin  (NEURONTIN ) 100 MG capsule Take 1 capsule (100 mg total) by mouth 3 (three) times daily. 270 capsule 4   metoprolol  tartrate (LOPRESSOR ) 25 MG tablet TAKE 1 TABLET(25 MG) BY MOUTH TWICE DAILY 180 tablet 0   omeprazole (PRILOSEC) 40 MG capsule Take 1 capsule by mouth daily.     oxyCODONE  (OXY IR/ROXICODONE ) 5 MG immediate release tablet Take 1-2 tablets (5-10 mg total) by mouth every 6 (six) hours as needed for moderate pain (pain score 4-6) (pain score 4-6). 30 tablet 0   sertraline  (ZOLOFT ) 25 MG tablet Take 25 mg by mouth daily.     tiZANidine  (ZANAFLEX ) 4 MG tablet Take 1 tablet (4 mg total) by mouth every 6 (six) hours as needed for muscle spasms. 30 tablet 0   triamterene -hydrochlorothiazide  (MAXZIDE -25) 37.5-25 MG tablet Take 0.5 tablets by mouth daily.  0   No current facility-administered medications for this visit.    REVIEW OF SYSTEMS:   Constitutional: Denies fevers, chills or abnormal night sweats All other systems were reviewed with the patient and are negative.  PHYSICAL EXAMINATION: ECOG PERFORMANCE STATUS: 1 - Symptomatic  but completely ambulatory  Vitals:   11/20/23 1256  BP: (!) 117/56  Pulse: (!) 51  Resp: 16  Temp: 97.7 F (36.5 C)  SpO2: 99%   Filed Weights   11/20/23 1256  Weight: 150 lb 6.4 oz (68.2 kg)    GENERAL:alert, no distress and comfortable   LABORATORY DATA:  I have reviewed the data as listed Lab Results  Component Value Date   WBC 3.9 (L) 11/20/2023   HGB 12.2 11/20/2023   HCT 36.9 11/20/2023   MCV 90.2 11/20/2023   PLT 288 11/20/2023   Lab Results  Component Value Date   NA 139 11/20/2023   K 3.6 11/20/2023   CL 104 11/20/2023   CO2 28 11/20/2023    RADIOGRAPHIC STUDIES: I have personally reviewed the radiological reports and agreed with the findings in the report.  ASSESSMENT AND PLAN:  Malignant neoplasm of upper-outer quadrant of left breast in female, estrogen receptor positive (HCC) 11/18/2023:Screening mammogram detected focal asymmetry in the left breast 5 mm by ultrasound measured 0.6 cm at 1 o'clock position 6 cm from the nipple.  Biopsy revealed grade 2 IDC no LVI, ER 100%, PR 1%, HER2 negative, Ki67 5%  Pathology and radiology counseling: Discussed with the patient, the details of pathology including the type of breast cancer,the clinical staging, the significance of ER, PR and HER-2/neu receptors and the implications for treatment. After reviewing the pathology in detail, we proceeded to discuss the different treatment options between surgery, radiation, antiestrogen therapies.  Treatment plan: Breast conserving surgery Plus or minus adjuvant radiation Antiestrogen therapy  After listening to the different options patient decided that she would like to forego radiation and would like to take adjuvant antiestrogen therapy.  Once she completes surgery we will discuss further about the pros and cons of antiestrogens and get her started on anastrozole.    All questions were answered. The patient knows to call the clinic with any problems, questions or  concerns.    Naomi  MARLA Chad, MD 11/20/23

## 2023-11-20 NOTE — Research (Signed)
 [employer] 2021-05 - Specimen Collection Study to Evaluate Biomarkers in Subjects with Cancer    Patient Morgan Swanson was identified by Dr. Gudena as a potential candidate for the above listed study.  This Clinical Research Coordinator met with Morgan Swanson, [id number], on 11/20/23 in a manner and location that ensures patient privacy to discuss participation in the above listed research study.  Patient is Unaccompanied.  A copy of the informed consent document with embedded HIPAA language was provided to the patient.  Patient reads, speaks, and understands [place].   Patient was provided with the business card of this Coordinator and encouraged to contact the research team with any questions.  Approximately 10 minutes were spent with the patient reviewing the informed consent documents.  Patient was provided the option of taking informed consent documents home to review and was encouraged to review at their convenience with their support network, including other care providers. Patient took the consent documents home to review.  Ms. Altice agreed research team to have a follow-up call after 2 pm on Friday afternoon. Pt was thanked for her time and encouraged to contact research team with any questions/concerns.   Mekala Winger, Ph.D. Clinical Research Coordinator [phone] 11/20/2023 3:39 PM

## 2023-11-20 NOTE — Assessment & Plan Note (Signed)
 11/18/2023:Screening mammogram detected focal asymmetry in the left breast 5 mm by ultrasound measured 0.6 cm at 1 o'clock position 6 cm from the nipple.  Biopsy revealed grade 2 IDC no LVI, ER 100%, PR 1%, HER2 negative, Ki67 5%  Pathology and radiology counseling: Discussed with the patient, the details of pathology including the type of breast cancer,the clinical staging, the significance of ER, PR and HER-2/neu receptors and the implications for treatment. After reviewing the pathology in detail, we proceeded to discuss the different treatment options between surgery, radiation, antiestrogen therapies.  Treatment plan: Breast conserving surgery Plus or minus adjuvant radiation Antiestrogen therapy  After listening to the different options patient decided that she would like to forego radiation and would like to take adjuvant antiestrogen therapy.  Once she completes surgery we will discuss further about the pros and cons of antiestrogens and get her started on anastrozole.

## 2023-11-20 NOTE — Telephone Encounter (Signed)
 Ms. Ardila was seen by a genetic counselor during the breast multidisciplinary clinic on November 20, 2023. In addition to her personal history of breast cancer, she reported a family history of breast cancer in her mother in her 20s, and a history of lung cancer in her father who was as moker. She does not meet NCCN criteria for genetic testing at this time.   She was still offered genetic counseling and testing but declined. We encourage her to contact us  if there are any changes to her personal or family history of cancer. If she meets NCCN criteria based on the updated personal/family history, she would be recommended to have genetic counseling and testing.      Darice Monte, MS, CGC  Licensed, Patent attorney [email or web address] phone: [phone]

## 2023-11-21 ENCOUNTER — Other Ambulatory Visit: Payer: Self-pay | Admitting: General Surgery

## 2023-11-22 ENCOUNTER — Telehealth: Payer: Self-pay

## 2023-11-22 ENCOUNTER — Telehealth: Payer: Self-pay | Admitting: Hematology and Oncology

## 2023-11-22 DIAGNOSIS — Z170 Estrogen receptor positive status [ER+]: Secondary | ICD-10-CM

## 2023-11-22 NOTE — Telephone Encounter (Signed)
 [employer] 2021-05 - Specimen Collection Study to Evaluate Biomarkers in Subjects with Cancer    Ms. Lauderback was contacted by phone regarding participation in the above study. Pt declined participation. All questions were addressed during the call. Pt was thanked for her time and was informed to contact the research team with any further questions.  Zuzu Befort, Ph.D. Clinical Research Coordinator [phone] 11/22/2023 3:35 PM

## 2023-11-22 NOTE — Telephone Encounter (Signed)
 left vm for pt about scheduled apt date and time

## 2023-11-27 ENCOUNTER — Encounter: Payer: Self-pay | Admitting: General Practice

## 2023-11-27 NOTE — Progress Notes (Signed)
 [facility] Multidisciplinary Clinic Spiritual Care Note  Left voicemail for Morgan Swanson following Breast Multidisciplinary Clinic to introduce Support Center team/resources, encouraging return call.  She completed SDOH screening; results follow below.    SDOH Screenings   Food Insecurity: No Food Insecurity (11/20/2023)  Housing: Low Risk  (11/27/2023)  Transportation Needs: No Transportation Needs (11/20/2023)  Utilities: Not At Risk (11/20/2023)  Depression (PHQ2-9): Low Risk  (11/20/2023)  Financial Resource Strain: Not at Risk (10/28/2023)   Received from [facility]  Physical Activity: At Risk (10/28/2023)   Received from [facility]  Social Connections: Not at Risk (10/28/2023)   Received from [facility]  Stress: Not at Risk (10/28/2023)   Received from [facility]  Tobacco Use: Medium Risk (11/21/2023)   Received from [facility] and patient discussed common feelings and emotions when being diagnosed with cancer, and the importance of support during treatment.  Chaplain informed patient of the support team and support services at [facility].  Chaplain provided contact information and encouraged patient to call with any questions or concerns.  Follow up needed: No. Pt received full packet of [facility] team and [facility] support programming information, as well.   [address] Olam Corrigan, [place], [facility] Pager [phone] Voicemail [phone]

## 2023-11-28 ENCOUNTER — Telehealth: Payer: Self-pay | Admitting: *Deleted

## 2023-11-28 ENCOUNTER — Encounter: Payer: Self-pay | Admitting: *Deleted

## 2023-11-28 ENCOUNTER — Encounter: Payer: Self-pay | Admitting: Family Medicine

## 2023-11-28 NOTE — Progress Notes (Signed)
 Surgical Instructions   Your procedure is scheduled on Wednesday December 04, 2023. Report to [facility] Main Entrance A at 12:30 P. M. then check in with the Admitting office. Any questions or running late day of surgery: call [phone]  Questions prior to your surgery date: call [phone], Monday-Friday, 8am-4pm. If you experience any cold or flu symptoms such as cough, fever, chills, shortness of breath, etc. between now and your scheduled surgery, please notify us  at the above number.     Remember:  Do not eat after midnight the night before your surgery   You may drink clear liquids until 11:30 the morning of your surgery.   Clear liquids allowed are: Water, Non-Citrus Juices (without pulp), Carbonated Beverages, Clear Tea (no milk, honey, etc.), Black Coffee Only (NO MILK, CREAM OR POWDERED CREAMER of any kind), and Gatorade.  Patient Instructions  The night before surgery:  No food after midnight. ONLY clear liquids after midnight  The day of surgery (if you do NOT have diabetes):  Drink ONE (1) Pre-Surgery Clear Ensure by 11:30 the morning of surgery. Drink in one sitting. Do not sip.  This drink was given to you during your hospital  pre-op appointment visit.  Nothing else to drink after completing the  Pre-Surgery Clear Ensure.         If you have questions, please contact your surgeon's office.     Take these medicines the morning of surgery with A SIP OF WATER  amLODipine  (NORVASC )  busPIRone  (BUSPAR )  CRESTOR   gabapentin  (NEURONTIN )  metoprolol  tartrate (LOPRESSOR )  omeprazole (PRILOSEC)  sertraline  (ZOLOFT )   May take these medicines IF NEEDED: baclofen  (LIORESAL )  tiZANidine  (ZANAFLEX )    Follow your surgeon's instructions on when to stop Asprin.  If no instructions were given by your surgeon then you will need to call the office to get those instructions.    One week prior to surgery, STOP taking any Aleve, Naproxen, Ibuprofen, Motrin, Advil,  Goody's, BC's, all herbal medications, fish oil, and non-prescription vitamins. This includes your Aspirin -Caffeine (BC FAST PAIN RELIEF PO)                       Do NOT Smoke (Tobacco/Vaping) for 24 hours prior to your procedure.  If you use a CPAP at night, you may bring your mask/headgear for your overnight stay.   You will be asked to remove any contacts, glasses, piercing's, hearing aid's, dentures/partials prior to surgery. Please bring cases for these items if needed.    Patients discharged the day of surgery will not be allowed to drive home, and someone needs to stay with them for 24 hours.  SURGICAL WAITING ROOM VISITATION Patients may have no more than 2 support people in the waiting area - these visitors may rotate.   Pre-op nurse will coordinate an appropriate time for 1 ADULT support person, who may not rotate, to accompany patient in pre-op.  Children under the age of 73 must have an adult with them who is not the patient and must remain in the main waiting area with an adult.  If the patient needs to stay at the hospital during part of their recovery, the visitor guidelines for inpatient rooms apply.  Please refer to the [facility] website for the visitor guidelines for any additional information.   If you received a COVID test during your pre-op visit  it is requested that you wear a mask when out in public, stay away from anyone  that may not be feeling well and notify your surgeon if you develop symptoms. If you have been in contact with anyone that has tested positive in the last 10 days please notify you surgeon.      Pre-operative CHG Bathing Instructions   You can play a key role in reducing the risk of infection after surgery. Your skin needs to be as free of germs as possible. You can reduce the number of germs on your skin by washing with CHG (chlorhexidine  gluconate) soap before surgery. CHG is an antiseptic soap that kills germs and continues to kill germs even  after washing.   DO NOT use if you have an allergy to chlorhexidine /CHG or antibacterial soaps. If your skin becomes reddened or irritated, stop using the CHG and notify one of our RNs at [phone].              TAKE A SHOWER THE NIGHT BEFORE SURGERY AND THE DAY OF SURGERY    Please keep in mind the following:  DO NOT shave, including legs and underarms, 48 hours prior to surgery.   Place clean sheets on your bed the night before surgery Use a clean washcloth (not used since being washed) for each shower. DO NOT sleep with pet's night before surgery.  CHG Shower Instructions:  Wash your face and private area with normal soap. If you choose to wash your hair, wash first with your normal shampoo.  After you use shampoo/soap, rinse your hair and body thoroughly to remove shampoo/soap residue.  Turn the water OFF and apply half the bottle of CHG soap to a CLEAN washcloth.  Apply CHG soap ONLY FROM YOUR NECK DOWN TO YOUR TOES (washing for 3-5 minutes)  DO NOT use CHG soap on face, private areas, open wounds, or sores.  Pay special attention to the area where your surgery is being performed.  If you are having back surgery, having someone wash your back for you may be helpful. Wait 2 minutes after CHG soap is applied, then you may rinse off the CHG soap.  Pat dry with a clean towel  Put on clean pajamas    Additional instructions for the day of surgery: DO NOT APPLY any lotions, deodorants or perfumes.   Do not wear jewelry or makeup Do not wear nail polish, gel polish, artificial nails, or any other type of covering on natural nails (fingers and toes) Do not bring valuables to the hospital. [facility] is not responsible for valuables/personal belongings. Put on clean/comfortable clothes.  Please brush your teeth.  Ask your nurse before applying any prescription medications to the skin.

## 2023-11-28 NOTE — Telephone Encounter (Signed)
 [facility] follow up call. Left message on patient's voice mail stating was following up and wanted to see if she had any questions/needs after attending breast clinic next week. Left phone number for her to return call if desired.

## 2023-11-29 ENCOUNTER — Other Ambulatory Visit: Payer: Self-pay

## 2023-11-29 ENCOUNTER — Inpatient Hospital Stay (HOSPITAL_COMMUNITY)
Admission: RE | Admit: 2023-11-29 | Discharge: 2023-11-29 | Disposition: A | Discharge status: Home / Self Care | Source: Ambulatory Visit

## 2023-11-29 ENCOUNTER — Encounter (HOSPITAL_COMMUNITY): Payer: Self-pay

## 2023-11-29 HISTORY — DX: Malignant (primary) neoplasm, unspecified: C80.1

## 2023-11-29 NOTE — Progress Notes (Signed)
 Called patient x 2 left message about PAT about at 1:00 PM today

## 2023-11-29 NOTE — Progress Notes (Signed)
 Spoke with patient and gave instruction for upcoming procedure. Patient states she was unaware of PAT appointment today was very apologetic. Thankful for call

## 2023-11-29 NOTE — Progress Notes (Addendum)
 PCP - Yaya, Irene J  Cardiologist -  Hematology and Eugenio Potts, MD   PPM/ICD - denies Device Orders - n/a Rep Notified - n/a  Chest x-ray -  EKG - 05-02-23 Stress Test -  ECHO - 09-2016 Cardiac Cath -   Sleep Study - denies CPAP -   DM -denies   Blood Thinner Instructions:denies Aspirin  Instructions:instructed to follow up with surgeon for further instructions  ERAS Protcol -clear liquids until 11:30 am.  COVID TEST- n/a   Anesthesia review: yes HTN, HLD, seed placement. Seed placement  at [facility] on Sept, 2, at 1:00 pm.  Patient denies shortness of breath, fever, cough and chest pain at PAT appointment   All instructions explained to the patient, with a verbal understanding of the material. Patient agrees to go over the instructions while at home for a better understanding. Patient also instructed to self quarantine after being tested for COVID-19. The opportunity to ask questions was provided.

## 2023-12-03 ENCOUNTER — Encounter: Payer: Self-pay | Admitting: General Practice

## 2023-12-03 NOTE — Progress Notes (Signed)
 Anesthesia Chart Review:  73 year old female former smoker (quit 2001) with pertinent history including GERD on PPI, HTN, palpitations, anxiety/depression, chronic pain.   Patient was evaluated by cardiology in 2023 for hypertension and palpitations.  She wore a heart monitor that showed numerous PACs and short runs of PAT but no sustained arrhythmia.  She was started on metoprolol  with improvement.  She had a prior echo in 09/2016 that showed EF of 55 to 60%, grade 1 DD, no significant valvular abnormalities.   Recently underwent right TKA 05/07/2023 without complication.   Preop labs reviewed, unremarkable.   EKG 05/02/2023: NSR.  Possible LAE.  Rate 78.  Minimal voltage criteria for LVH, may be normal variant.   TTE 10/04/2016: - Left ventricle: The cavity size was normal. Wall thickness was    increased in a pattern of mild LVH. Systolic function was normal.    The estimated ejection fraction was in the range of 55% to 60%.    Wall motion was normal; there were no regional wall motion    abnormalities. Doppler parameters are consistent with abnormal    left ventricular relaxation (grade 1 diastolic dysfunction).  - Aortic valve: There was no stenosis.  - Mitral valve: There was trivial regurgitation.  - Right ventricle: The cavity size was mildly dilated. Systolic    function was normal.  - Tricuspid valve: Peak RV-RA gradient (S): 31 mm Hg.  - Pulmonary arteries: PA peak pressure: 34 mm Hg (S).  - Inferior vena cava: The vessel was normal in size. The    respirophasic diameter changes were in the normal range (= 50%),    consistent with normal central venous pressure.   Impressions:   - Normal LV size with mild LV hypertrophy. EF 55-60%. Mildly    increased RV size with normal systolic function. No significant    valvular abnormalities.       Lynwood Geofm RIGGERS [facility] Short Stay Center/Anesthesiology Phone [phone] 12/03/2023 9:43 AM

## 2023-12-03 NOTE — Anesthesia Preprocedure Evaluation (Signed)
 Anesthesia Evaluation  Patient identified by MRN, date of birth, ID band Patient awake    Reviewed: Allergy & Precautions, NPO status , Patient's Chart, lab work & pertinent test results, reviewed documented beta blocker date and time   History of Anesthesia Complications Negative for: history of anesthetic complications  Airway Mallampati: II  TM Distance: >3 FB Neck ROM: Full    Dental  (+) Missing, Chipped, Dental Advisory Given,    Pulmonary former smoker   Pulmonary exam normal breath sounds clear to auscultation       Cardiovascular hypertension, Pt. on medications and Pt. on home beta blockers Normal cardiovascular exam Rhythm:Regular Rate:Normal   evaluated by cardiology in 2023 for hypertension and palpitations.  She wore a heart monitor that showed numerous PACs and short runs of PAT but no sustained arrhythmia.  She was started on metoprolol  with improvement.  She had a prior echo in 09/2016 that showed EF of 55 to 60%, grade 1 DD, no significant valvular abnormalities.   Neuro/Psych  PSYCHIATRIC DISORDERS Anxiety Depression    negative neurological ROS     GI/Hepatic Neg liver ROS,GERD  Medicated and Controlled,,  Endo/Other  negative endocrine ROS    Renal/GU Renal InsufficiencyRenal disease  negative genitourinary   Musculoskeletal  (+) Arthritis , Osteoarthritis,    Abdominal   Peds  Hematology negative hematology ROS (+) Hb 13.3, plt 313   Anesthesia Other Findings   Reproductive/Obstetrics negative OB ROS                              Anesthesia Physical Anesthesia Plan  ASA: 2  Anesthesia Plan: General   Post-op Pain Management: Ofirmev  IV (intra-op)*   Induction:   PONV Risk Score and Plan: 3 and Ondansetron , Dexamethasone  and Treatment may vary due to age or medical condition  Airway Management Planned: LMA  Additional Equipment: None  Intra-op Plan:    Post-operative Plan: Extubation in OR  Informed Consent: I have reviewed the patients History and Physical, chart, labs and discussed the procedure including the risks, benefits and alternatives for the proposed anesthesia with the patient or authorized representative who has indicated his/her understanding and acceptance.       Plan Discussed with: CRNA  Anesthesia Plan Comments: (PAT note by Lynwood Hope, PA-C: 73 year old female former smoker (quit 2001) with pertinent history including GERD on PPI, HTN, palpitations, anxiety/depression, chronic pain.   Patient was evaluated by cardiology in 2023 for hypertension and palpitations.  She wore a heart monitor that showed numerous PACs and short runs of PAT but no sustained arrhythmia.  She was started on metoprolol  with improvement.  She had a prior echo in 09/2016 that showed EF of 55 to 60%, grade 1 DD, no significant valvular abnormalities.   Recently underwent right TKA 05/07/2023 without complication.   Preop labs reviewed, unremarkable.   EKG 05/02/2023: NSR.  Possible LAE.  Rate 78.  Minimal voltage criteria for LVH, may be normal variant.   TTE 10/04/2016: - Left ventricle: The cavity size was normal. Wall thickness was    increased in a pattern of mild LVH. Systolic function was normal.    The estimated ejection fraction was in the range of 55% to 60%.    Wall motion was normal; there were no regional wall motion    abnormalities. Doppler parameters are consistent with abnormal    left ventricular relaxation (grade 1 diastolic dysfunction).  - Aortic valve: There  was no stenosis.  - Mitral valve: There was trivial regurgitation.  - Right ventricle: The cavity size was mildly dilated. Systolic    function was normal.  - Tricuspid valve: Peak RV-RA gradient (S): 31 mm Hg.  - Pulmonary arteries: PA peak pressure: 34 mm Hg (S).  - Inferior vena cava: The vessel was normal in size. The    respirophasic diameter changes were in the  normal range (= 50%),    consistent with normal central venous pressure.   Impressions:   - Normal LV size with mild LV hypertrophy. EF 55-60%. Mildly    increased RV size with normal systolic function. No significant    valvular abnormalities.     )         Anesthesia Quick Evaluation

## 2023-12-03 NOTE — H&P (Signed)
 REFERRING PHYSICIAN:  Dorothe Ruth   PROVIDER:  JINA CLAIR NEPHEW, MD   Care Team: Patient Care Team: Earvin Johnston PARAS as PCP - General NEPHEW JINA CLAIR, MD as Consulting Provider (Surgical Oncology) Izell Lauraine Norris, MD (Radiation Oncology) Odean Mackey POUR, MD as Referring Physician (Hematology and Oncology) Anner Alm Lin, MD (Cardiovascular Disease) Vernetta Lonni GRADE, MD (Orthopedic Surgery)    MRN: [id number] DOB: 1950/08/15 DATE OF ENCOUNTER: 11/21/2023   Subjective    Chief Complaint: Breast Cancer       History of Present Illness: Morgan Swanson is a 73 y.o. female who is seen today as an office consultation at the request of Dr. Gudena for evaluation of Breast Cancer     Patient presents with a new diagnosis of left breast cancer August 2025.  The patient was recalled from screening due to an asymmetry in the left breast.  Diagnostic imaging was performed.  This showed a 5 mm mass on mammogram and 6 mm mass on ultrasound.  This was at 1:00 in the upper outer quadrant.  Core needle biopsy was performed.  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  Prognostic panel showed hormone positive cancer is negative for HER2.   Patient has a mother who had breast cancer.  There is no other known family members with cancer.         Family cancer history - mother had breast cancer Menarche - 49 Menopause - age 69 last period Parity G2P2   Work - works [employer] in [employer].    Diagnostic mammogram and ultrasound :[facility] 11-11-23 0.6 cm irregular mass with indistinct margin in the left breast upper outer aspect middle depth no other significant masses or calcifications seen, breast density A.  Ultrasound is recommended.  Ultrasound shows a irregular mass in the left breast at 1:00 6 cm from the nipple.  Hypoechoic.  Internal vascularity is seen.  This is suspicious with BI-RADS 4.  Biopsy is recommended.   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   Lymphovascular invasion not identified, cancer in length 5 mm in greatest linear dimension   Receptors: Estrogen receptor 100% positive strong staining intensity Progesterone receptor 1% positive weak to moderate staining intensity Tumor cells are negative for HER2 Proliferation marker Ki-67 5%     Review of Systems: A complete review of systems was obtained from the patient.  I have reviewed this information and discussed as appropriate with the patient.  See HPI as well for other ROS. ROS - Patient has a history of occasional palpitations and as well as arthritis.  She does have a low-dose fentanyl  patch for low back pain.       Medical History: Past Medical History      Past Medical History:  Diagnosis Date   Anxiety     Arthritis     GERD (gastroesophageal reflux disease)     History of cancer     Hyperlipidemia     Hypertension          Problem List     Patient Active Problem List  Diagnosis   Hyperlipidemia   GERD (gastroesophageal reflux disease)   Benign essential HTN   Generalized anxiety disorder   Chronic bilateral low back pain with sciatica   Depression   Heart palpitations   History of palpitations   Lumbar radiculopathy   Malignant neoplasm of upper-outer quadrant of left breast in  female, estrogen receptor positive (CMS/HHS-HCC)   Spinal stenosis of lumbar region   Status post total right knee replacement   Systolic murmur   Vitamin D deficiency   Memory loss        Past Surgical History       Past Surgical History:  Procedure Laterality Date   Zio Patch Monitor [Other] N/A 08/2021   JOINT REPLACEMENT Right 05/07/2023    Right Total Knee   Total knee arthroplasty (Right Right 05/07/2023   CHOLECYSTECTOMY       Hand surgery N/A      Date unknown   HYSTERECTOMY N/A      Vaginal 1983   Transthoracic echocardiogram N/A      12/2021 & 09/2016         Allergies       Allergies  Allergen Reactions   Codeine Phosphate Palpitations   Lisinopril Cough   Penicillins Unknown   Codeine Palpitations      Feels funny, drunk        Medications Ordered Prior to Encounter        Current Outpatient Medications on File Prior to Visit  Medication Sig Dispense Refill   amLODIPine  (NORVASC ) 10 MG tablet Take 10 mg by mouth once daily       gabapentin  (NEURONTIN ) 100 MG capsule Take 100 mg by mouth 3 (three) times daily       metoprolol  TARTrate (LOPRESSOR ) 25 MG tablet Take 25 mg by mouth 2 (two) times daily       omeprazole (PRILOSEC) 40 MG DR capsule Take 40 mg by mouth every morning before breakfast       rosuvastatin  (CRESTOR ) 20 MG tablet Take 20 mg by mouth at bedtime       sertraline  (ZOLOFT ) 25 MG tablet Take 25 mg by mouth once daily       triamterene -hydroCHLOROthiazide  (MAXZIDE -25) 37.5-25 mg tablet Take 1 tablet by mouth once daily        No current facility-administered medications on file prior to visit.        Family History       Family History  Problem Relation Age of Onset   Diabetes Mother     Breast cancer Mother     Lung cancer Father     Stroke Brother     High blood pressure (Hypertension) Brother          Tobacco Use History  Social History        Tobacco Use  Smoking Status Former   Types: Cigarettes  Smokeless Tobacco Never        Social History  Social History         Socioeconomic History   Marital status: Single  Tobacco Use   Smoking status: Former      Types: Cigarettes   Smokeless tobacco: Never  Vaping Use   Vaping status: Never Used  Substance and Sexual Activity   Drug use: Never    Social Drivers of Acupuncturist Strain: Not at Risk (10/28/2023)    Received from [facility]     How hard is it for you to pay for the very basics like food, housing, heating, medical care, and medications?: 1  Food Insecurity: No Food Insecurity  (11/20/2023)    Received from [facility]    Hunger Vital Sign     Within the past 12 months, you worried that your food  would run out before you got the money to buy more.: Never true     Within the past 12 months, the food you bought just didn't last and you didn't have money to get more.: Never true  Transportation Needs: No Transportation Needs (11/20/2023)    Received from [facility] - Transportation     In the past 12 months, has lack of transportation kept you from medical appointments or from getting medications?: No     In the past 12 months, has lack of transportation kept you from meetings, work, or from getting things needed for daily living?: No  Physical Activity: At Risk (10/28/2023)    Received from [facility]    Physical Activity     Weekly Physical Activity: 2  Stress: Not at Risk (10/28/2023)    Received from [facility]    Stress     Do you feel these kinds of stress these days?: 1  Social Connections: Not at Risk (10/28/2023)    Received from [facility]    Social Connections     How often do you see or talk to people that you care about and feel close to? (For example: talking to friends on phone, visiting friends or family, going to church or club meetings): 1  Housing Stability: Not at Risk (10/28/2023)    Received from [facility] Stability     What is your living situation today? : 1        Objective:         Vitals:    11/21/23 1216  BP: 117/56  Pulse: 51  Resp: 16  Temp: 36.5 C (97.7 F)  Weight: 68.2 kg (150 lb 6.4 oz)  Height: 165.1 cm (5' 5)    Body mass index is 25.03 kg/m.   Gen:  No acute distress.  Well nourished and well groomed.   Neurological: Alert and oriented to person, place, and time. Coordination normal.  Head: Normocephalic and atraumatic.  Eyes: Conjunctivae are normal. Pupils are equal, round, and reactive to light. No scleral icterus.  Neck: Normal range of motion. Neck supple. No tracheal deviation or thyromegaly present.   Cardiovascular: Normal rate, regular rhythm, normal heart sounds and intact distal pulses.  Exam reveals no gallop and no friction rub.  No murmur heard. Breast: Breasts are relatively symmetric.  There is ptosis bilaterally.  No palpable masses are present.  No lymphadenopathy is appreciated.  No contour changes or nipple retraction is seen.  There is no nipple discharge.  No skin dimpling.  Breast exam is benign on the right. Respiratory: Effort normal.  No respiratory distress. No chest wall tenderness. Breath sounds normal.  No wheezes, rales or rhonchi.  GI: Soft. Bowel sounds are normal. The abdomen is soft and nontender.  There is no rebound and no guarding.  Musculoskeletal: Normal range of motion. Extremities are nontender.  Lymphadenopathy: No cervical, preauricular, postauricular or axillary adenopathy is present Skin: Skin is warm and dry. No rash noted. No diaphoresis. No erythema. No pallor. No clubbing, cyanosis, or edema.   Psychiatric: Normal mood and affect. Behavior is normal. Judgment and thought content normal.      Labs 11/20/2023 CBC and CMET essentially normal.     Assessment and Plan:        ICD-10-CM    1. Malignant neoplasm of upper-outer quadrant of left breast in female, estrogen receptor positive (CMS/HHS-HCC)  C50.412      Z17.0  2. Heart palpitations  R00.2       3. Benign essential HTN  I10       4. Generalized anxiety disorder  F41.1       5. Spinal stenosis of lumbar region, unspecified whether neurogenic claudication present  M48.061         Patient has a new diagnosis of clinical T1b N0 left breast cancer.  This is amenable to breast conservation.  Due to her age and favorable prognostic panel.  We will plan a seed localized lumpectomy with out lymph node biopsy.  This will be followed by adjuvant endocrine therapy and likely no radiation therapy unless there are surprises on final pathology.   Patient is offered genetic testing.   The  surgical procedure was described to the patient.  I discussed the incision type and location and that we would need radiology involved pre op to place a seed 1-2 days pre op.      We discussed the risks bleeding, infection, damage to other structures, need for further procedures/surgeries.  We discussed the risk of seroma.  The patient was advised if the breast has cancer, we may need to go back to surgery for additional tissue to obtain negative margins.  The patient was advised that these are the most common complications, but that others can occur as well. I discussed the risk of alteration in breast contour or size.  I discussed risk of chronic pain.  There are rare instances of heart/lung issues post op as well as blood clots.      They were advised against taking aspirin  or other anti-inflammatory agents/blood thinners the week before surgery.     The risks and benefits of the procedure were described to the patient and she wishes to proceed.

## 2023-12-03 NOTE — Progress Notes (Signed)
 [facility] Spiritual Care Note  Reached Ms Rudell by phone for [facility] follow-up call. She was in good spirits, citing strong support from her eight siblings and a close church friend who is a cancer survivor. At this time, she has no questions or concerns, but she is aware of ongoing support team and programming availability in case needs arise or circumstances change.  [address] [place], [place], [facility] Pager [phone] Voicemail [phone]

## 2023-12-04 ENCOUNTER — Ambulatory Visit (HOSPITAL_COMMUNITY)
Admission: RE | Admit: 2023-12-04 | Discharge: 2023-12-04 | Disposition: A | Discharge status: Home / Self Care | Attending: General Surgery | Admitting: General Surgery

## 2023-12-04 ENCOUNTER — Other Ambulatory Visit: Payer: Self-pay

## 2023-12-04 ENCOUNTER — Encounter (HOSPITAL_COMMUNITY)
Admission: RE | Disposition: A | Discharge status: Home / Self Care | Payer: Self-pay | Source: Home / Self Care | Attending: General Surgery

## 2023-12-04 ENCOUNTER — Encounter (HOSPITAL_COMMUNITY): Payer: Self-pay | Admitting: General Surgery

## 2023-12-04 ENCOUNTER — Ambulatory Visit (HOSPITAL_COMMUNITY): Payer: Self-pay | Admitting: Physician Assistant

## 2023-12-04 DIAGNOSIS — Z170 Estrogen receptor positive status [ER+]: Secondary | ICD-10-CM | POA: Diagnosis not present

## 2023-12-04 DIAGNOSIS — Z87891 Personal history of nicotine dependence: Secondary | ICD-10-CM | POA: Insufficient documentation

## 2023-12-04 DIAGNOSIS — Z1721 Progesterone receptor positive status: Secondary | ICD-10-CM | POA: Diagnosis not present

## 2023-12-04 DIAGNOSIS — I10 Essential (primary) hypertension: Secondary | ICD-10-CM | POA: Insufficient documentation

## 2023-12-04 DIAGNOSIS — C50912 Malignant neoplasm of unspecified site of left female breast: Secondary | ICD-10-CM

## 2023-12-04 DIAGNOSIS — K219 Gastro-esophageal reflux disease without esophagitis: Secondary | ICD-10-CM | POA: Insufficient documentation

## 2023-12-04 DIAGNOSIS — E785 Hyperlipidemia, unspecified: Secondary | ICD-10-CM | POA: Insufficient documentation

## 2023-12-04 DIAGNOSIS — Z803 Family history of malignant neoplasm of breast: Secondary | ICD-10-CM | POA: Insufficient documentation

## 2023-12-04 DIAGNOSIS — C50412 Malignant neoplasm of upper-outer quadrant of left female breast: Secondary | ICD-10-CM | POA: Insufficient documentation

## 2023-12-04 SURGERY — LUMPECTOMY WITH MAGNETIC MARKER LOCALIZATION
Anesthesia: General | Site: Breast | Laterality: Left

## 2023-12-04 MED ORDER — FENTANYL CITRATE (PF) 100 MCG/2ML IJ SOLN: 25.0000 ug | INTRAMUSCULAR | Status: DC | PRN

## 2023-12-04 MED ORDER — LACTATED RINGERS IV SOLN: INTRAVENOUS | Status: DC

## 2023-12-04 MED ORDER — LIDOCAINE HCL 1 % IJ SOLN
INTRAMUSCULAR | Status: DC | PRN
  Administered 2023-12-04: 50 mL

## 2023-12-04 MED ORDER — PROPOFOL 10 MG/ML IV BOLUS
INTRAVENOUS | Status: DC | PRN
  Administered 2023-12-04: 30 mg via INTRAVENOUS
  Administered 2023-12-04: 50 mg via INTRAVENOUS
  Administered 2023-12-04: 100 mg via INTRAVENOUS

## 2023-12-04 MED ORDER — ACETAMINOPHEN 500 MG PO TABS
1000.0000 mg | ORAL_TABLET | ORAL | Status: DC
  Filled 2023-12-04: qty 2

## 2023-12-04 MED ORDER — CHLORHEXIDINE GLUCONATE 0.12 % MT SOLN
15.0000 mL | Freq: Once | OROMUCOSAL | Status: AC
  Administered 2023-12-04: 15 mL via OROMUCOSAL
  Filled 2023-12-04: qty 15

## 2023-12-04 MED ORDER — ORAL CARE MOUTH RINSE: 15.0000 mL | Freq: Once | OROMUCOSAL | Status: AC

## 2023-12-04 MED ORDER — MIDAZOLAM HCL 2 MG/2ML IJ SOLN
INTRAMUSCULAR | Status: AC
  Filled 2023-12-04: qty 2

## 2023-12-04 MED ORDER — ACETAMINOPHEN 10 MG/ML IV SOLN
INTRAVENOUS | Status: DC | PRN
  Administered 2023-12-04: 1000 mg via INTRAVENOUS

## 2023-12-04 MED ORDER — LIDOCAINE HCL (PF) 1 % IJ SOLN
INTRAMUSCULAR | Status: AC
  Filled 2023-12-04: qty 30

## 2023-12-04 MED ORDER — OXYCODONE HCL 5 MG/5ML PO SOLN: 5.0000 mg | Freq: Once | ORAL | Status: DC | PRN

## 2023-12-04 MED ORDER — CHLORHEXIDINE GLUCONATE CLOTH 2 % EX PADS: 6.0000 | MEDICATED_PAD | Freq: Once | CUTANEOUS | Status: DC

## 2023-12-04 MED ORDER — MIDAZOLAM HCL 2 MG/2ML IJ SOLN
INTRAMUSCULAR | Status: DC | PRN
  Administered 2023-12-04: 2 mg via INTRAVENOUS

## 2023-12-04 MED ORDER — BUPIVACAINE-EPINEPHRINE (PF) 0.25% -1:200000 IJ SOLN
INTRAMUSCULAR | Status: AC
  Filled 2023-12-04: qty 30

## 2023-12-04 MED ORDER — 0.9 % SODIUM CHLORIDE (POUR BTL) OPTIME
TOPICAL | Status: DC | PRN
  Administered 2023-12-04: 1000 mL

## 2023-12-04 MED ORDER — CIPROFLOXACIN IN D5W 400 MG/200ML IV SOLN
400.0000 mg | INTRAVENOUS | Status: AC
  Administered 2023-12-04: 400 mg via INTRAVENOUS
  Filled 2023-12-04: qty 200

## 2023-12-04 MED ORDER — ACETAMINOPHEN 10 MG/ML IV SOLN: 1000.0000 mg | Freq: Once | INTRAVENOUS | Status: DC | PRN

## 2023-12-04 MED ORDER — LIDOCAINE 2% (20 MG/ML) 5 ML SYRINGE
INTRAMUSCULAR | Status: DC | PRN
  Administered 2023-12-04: 60 mg via INTRAVENOUS

## 2023-12-04 MED ORDER — EPHEDRINE SULFATE-NACL 50-0.9 MG/10ML-% IV SOSY
PREFILLED_SYRINGE | INTRAVENOUS | Status: DC | PRN
  Administered 2023-12-04: 10 mg via INTRAVENOUS
  Administered 2023-12-04: 5 mg via INTRAVENOUS
  Administered 2023-12-04: 10 mg via INTRAVENOUS

## 2023-12-04 MED ORDER — ONDANSETRON HCL 4 MG/2ML IJ SOLN
INTRAMUSCULAR | Status: DC | PRN
  Administered 2023-12-04: 4 mg via INTRAVENOUS

## 2023-12-04 MED ORDER — OXYCODONE HCL 5 MG PO TABS: 5.0000 mg | ORAL_TABLET | Freq: Once | ORAL | Status: DC | PRN

## 2023-12-04 MED ORDER — DEXAMETHASONE SODIUM PHOSPHATE 10 MG/ML IJ SOLN
INTRAMUSCULAR | Status: DC | PRN
  Administered 2023-12-04: 10 mg via INTRAVENOUS

## 2023-12-04 MED ORDER — FENTANYL CITRATE (PF) 250 MCG/5ML IJ SOLN
INTRAMUSCULAR | Status: AC
  Filled 2023-12-04: qty 5

## 2023-12-04 MED ORDER — DROPERIDOL 2.5 MG/ML IJ SOLN: 0.6250 mg | Freq: Once | INTRAMUSCULAR | Status: DC | PRN

## 2023-12-04 MED ORDER — FENTANYL CITRATE (PF) 250 MCG/5ML IJ SOLN
INTRAMUSCULAR | Status: DC | PRN
  Administered 2023-12-04 (×3): 50 ug via INTRAVENOUS

## 2023-12-04 MED ORDER — OXYCODONE HCL 5 MG PO TABS: 5.0000 mg | ORAL_TABLET | Freq: Four times a day (QID) | ORAL | 0 refills | Status: DC | PRN

## 2023-12-04 SURGICAL SUPPLY — 29 items
BAG COUNTER SPONGE SURGICOUNT (BAG) ×1 IMPLANT
CANISTER SUCTION 3000ML PPV (SUCTIONS) ×1 IMPLANT
CHLORAPREP W/TINT 26 (MISCELLANEOUS) ×1 IMPLANT
CLIP TI MEDIUM 6 (CLIP) IMPLANT
COVER PROBE W GEL 5X96 (DRAPES) ×1 IMPLANT
COVER SURGICAL LIGHT HANDLE (MISCELLANEOUS) ×1 IMPLANT
DERMABOND ADVANCED .7 DNX12 (GAUZE/BANDAGES/DRESSINGS) ×1 IMPLANT
DEVICE DUBIN SPECIMEN MAMMOGRA (MISCELLANEOUS) ×1 IMPLANT
DRAPE CHEST BREAST 15X10 FENES (DRAPES) ×1 IMPLANT
ELECT COATED BLADE 2.86 ST (ELECTRODE) ×1 IMPLANT
ELECTRODE REM PT RTRN 9FT ADLT (ELECTROSURGICAL) ×1 IMPLANT
GLOVE BIO SURGEON STRL SZ 6 (GLOVE) ×2 IMPLANT
GLOVE SS BIOGEL STRL SZ 6.5 (GLOVE) ×1 IMPLANT
GOWN STRL REUS W/ TWL LRG LVL3 (GOWN DISPOSABLE) ×1 IMPLANT
GOWN STRL REUS W/ TWL XL LVL3 (GOWN DISPOSABLE) ×1 IMPLANT
KIT BASIN OR (CUSTOM PROCEDURE TRAY) ×1 IMPLANT
KIT MARKER MARGIN INK (KITS) ×1 IMPLANT
LIGHT WAVEGUIDE WIDE FLAT (MISCELLANEOUS) IMPLANT
NDL HYPO 25GX1X1/2 BEV (NEEDLE) ×1 IMPLANT
NEEDLE HYPO 25GX1X1/2 BEV (NEEDLE) ×1 IMPLANT
NS IRRIG 1000ML POUR BTL (IV SOLUTION) ×1 IMPLANT
PACK GENERAL/GYN (CUSTOM PROCEDURE TRAY) ×1 IMPLANT
STRIP CLOSURE SKIN 1/2X4 (GAUZE/BANDAGES/DRESSINGS) ×1 IMPLANT
SUT MNCRL AB 4-0 PS2 18 (SUTURE) ×1 IMPLANT
SUT VIC AB 2-0 SH 27XBRD (SUTURE) ×1 IMPLANT
SUT VIC AB 3-0 SH 27X BRD (SUTURE) ×1 IMPLANT
SYR CONTROL 10ML LL (SYRINGE) ×1 IMPLANT
TOWEL GREEN STERILE (TOWEL DISPOSABLE) ×1 IMPLANT
TOWEL GREEN STERILE FF (TOWEL DISPOSABLE) ×1 IMPLANT

## 2023-12-04 NOTE — Anesthesia Procedure Notes (Signed)
 Procedure Name: LMA Insertion Date/Time: 12/04/2023 3:47 PM  Performed by: Julien Manus, CRNAPre-anesthesia Checklist: Patient identified, Emergency Drugs available, Suction available and Patient being monitored Patient Re-evaluated:Patient Re-evaluated prior to induction Oxygen Delivery Method: Circle System Utilized Preoxygenation: Pre-oxygenation with 100% oxygen Induction Type: IV induction Ventilation: Mask ventilation without difficulty LMA: LMA inserted LMA Size: 4.0 Number of attempts: 1 Airway Equipment and Method: Bite block Placement Confirmation: positive ETCO2 Tube secured with: Tape Dental Injury: Teeth and Oropharynx as per pre-operative assessment

## 2023-12-04 NOTE — Discharge Instructions (Addendum)
 [facility] Office Phone Number [phone]  BREAST BIOPSY/ PARTIAL MASTECTOMY: POST OP INSTRUCTIONS  Always review your discharge instruction sheet given to you by the facility where your surgery was performed.  IF YOU HAVE DISABILITY OR FAMILY LEAVE FORMS, YOU MUST BRING THEM TO THE OFFICE FOR PROCESSING.  DO NOT GIVE THEM TO YOUR DOCTOR.  Take 2 tylenol  (acetominophen) three times a day for 3 days.  If you still have pain, add ibuprofen with food in between if able to take this (if you have kidney issues or stomach issues, do not take ibuprofen).  If both of those are not enough, add the narcotic pain pill.  If you find you are needing a lot of this overnight after surgery, call the next morning for a refill.    Prescriptions will not be filled after 5pm or on week-ends. Take your usually prescribed medications unless otherwise directed You should eat very light the first 24 hours after surgery, such as soup, crackers, pudding, etc.  Resume your normal diet the day after surgery. Most patients will experience some swelling and bruising in the breast.  Ice packs and a good support bra will help.  Swelling and bruising can take several days to resolve.  It is common to experience some constipation if taking pain medication after surgery.  Increasing fluid intake and taking a stool softener will usually help or prevent this problem from occurring.  A mild laxative (Milk of Magnesia or Miralax ) should be taken according to package directions if there are no bowel movements after 48 hours. Unless discharge instructions indicate otherwise, you may remove your bandages 48 hours after surgery, and you may shower at that time.  You may have steri-strips (small skin tapes) in place directly over the incision.  These strips should be left on the skin at least for for 7-10 days.    ACTIVITIES:  You may resume regular daily activities (gradually increasing) beginning the next day.  Wearing a  good support bra or sports bra (or the breast binder) minimizes pain and swelling.  You may have sexual intercourse when it is comfortable. No heavy lifting for 1-2 weeks (not over around 10 pounds).  You may drive when you no longer are taking prescription pain medication, you can comfortably wear a seatbelt, and you can safely maneuver your car and apply brakes. RETURN TO WORK:  __________3-14 days depending on job. _______________ Morgan Swanson should see your doctor in the office for a follow-up appointment approximately two weeks after your surgery.  Your doctor's nurse will typically make your follow-up appointment when she calls you with your pathology report.  Expect your pathology report 3-4 business days after your surgery.  You may call to check if you do not hear from us  after three days.   WHEN TO CALL YOUR DOCTOR: Fever over 101.0 Nausea and/or vomiting. Extreme swelling or bruising. Continued bleeding from incision. Increased pain, redness, or drainage from the incision.  The clinic staff is available to answer your questions during regular business hours.  Please don't hesitate to call and ask to speak to one of the nurses for clinical concerns.  If you have a medical emergency, go to the nearest emergency room or call 911.  A surgeon from [facility] is always on call at the hospital.  For further questions, please visit [email or web address]

## 2023-12-04 NOTE — Transfer of Care (Signed)
 Immediate Anesthesia Transfer of Care Note  Patient: Tamantha C Brennen  Procedure(s) Performed: LUMPECTOMY WITH MAGNETIC MARKER LOCALIZATION (Left: Breast)  Patient Location: PACU  Anesthesia Type:General  Level of Consciousness: awake and alert   Airway & Oxygen Therapy: Patient Spontanous Breathing and Patient connected to face mask oxygen  Post-op Assessment: Report given to RN and Post -op Vital signs reviewed and stable  Post vital signs: Reviewed and stable  Last Vitals:  Vitals Value Taken Time  BP 93/66 12/04/23 17:11  Temp    Pulse 77 12/04/23 17:14  Resp 19 12/04/23 17:14  SpO2 93 % 12/04/23 17:14  Vitals shown include unfiled device data.  Last Pain:  Vitals:   12/04/23 1322  TempSrc: Oral  PainSc:          Complications: No notable events documented.

## 2023-12-04 NOTE — Op Note (Signed)
 Left Breast seed localized lumpectomy  Indications: This patient presents with history of left breast cancer, upper outer quadrant, cT1bN0, grade 2 invasive ductal carcinoma with DCIS,  receptors +/+/-, Ki 67 5%  Pre-operative Diagnosis: left breast cancer  Post-operative Diagnosis: Same  Surgeon: ARON SHOULDERS   Anesthesia: General endotracheal anesthesia  ASA Class: 2  Procedure Details  The patient was seen in the Holding Room. The risks, benefits, complications, treatment options, and expected outcomes were discussed with the patient. The possibilities of bleeding, infection, the need for additional procedures, failure to diagnose a condition, and creating a complication requiring other procedures or operations were discussed with the patient. The patient concurred with the proposed plan, giving informed consent.  The site of surgery properly noted/marked. The patient was taken to Operating Room # 2, identified, and the procedure verified as left breast seed localized lumpectomy.  The left breast and chest were prepped and draped in standard fashion. A curvilinear incision was made near the previously placed Magseed.  Dissection was carried down around the point of maximum signal intensity.  The magseed immediately was visible after making the incision and this was taken out of the incision separately.  The tissue around the [facility] was then dissected out with the cautery.   The specimen was inked with the margin marker paint kit.    Specimen radiography confirmed inclusion of the mammographic clip.  The seed was placed into the specimen cup as well.  The background signal in the breast was zero.  Hemostasis was achieved with cautery.  Additional margins were taken at all the cardinal directions except posteriorly where the pectoralis was.  The cavity was marked with surgical clips on each border other than the anterior border.  The wound was irrigated and closed with 3-0 vicryl interrupted deep  dermal sutures and 4-0 monocryl running subcuticular suture.      Sterile dressings were applied. At the end of the operation, all sponge, instrument, and needle counts were correct.   Findings: Seed, clip in specimen.  Anterior margin is skin, posterior margin is pectoralis.    Estimated Blood Loss:  min         Specimens: Left breast tissue with seed, additional anterior margin, additional superior margin, additional lateral margin, additional inferior margin, additional medial margin         Complications:  None; patient tolerated the procedure well.         Disposition: PACU - hemodynamically stable.         Condition: stable

## 2023-12-04 NOTE — Anesthesia Postprocedure Evaluation (Signed)
 Anesthesia Post Note  Patient: Morgan Swanson  Procedure(s) Performed: LUMPECTOMY WITH MAGNETIC MARKER LOCALIZATION (Left: Breast)     Patient location during evaluation: PACU Anesthesia Type: General Level of consciousness: awake and alert Pain management: pain level controlled Vital Signs Assessment: post-procedure vital signs reviewed and stable Respiratory status: spontaneous breathing, nonlabored ventilation, respiratory function stable and patient connected to nasal cannula oxygen Cardiovascular status: blood pressure returned to baseline and stable Postop Assessment: no apparent nausea or vomiting Anesthetic complications: no   No notable events documented.  Last Vitals:  Vitals:   12/04/23 1730 12/04/23 1745  BP: 128/62 122/67  Pulse: 71 63  Resp: 16 16  Temp:  36.9 C  SpO2: 94% 95%    Last Pain:  Vitals:   12/04/23 1745  TempSrc:   PainSc: 4                  Thom JONELLE Peoples

## 2023-12-04 NOTE — Interval H&P Note (Signed)
 History and Physical Interval Note:  12/04/2023 2:37 PM  Morgan Swanson  has presented today for surgery, with the diagnosis of LEFT BREAST CANCER.  The various methods of treatment have been discussed with the patient and family. After consideration of risks, benefits and other options for treatment, the patient has consented to  Procedure(s) with comments: LUMPECTOMY WITH MAGNETIC MARKER LOCALIZATION (Left) - LEFT BREAST SEED LOCALIZED LUMPECTOMY as a surgical intervention.  The patient's history has been reviewed, patient examined, no change in status, stable for surgery.  I have reviewed the patient's chart and labs.  Questions were answered to the patient's satisfaction.     Jina Nephew

## 2023-12-10 ENCOUNTER — Encounter: Payer: Self-pay | Admitting: *Deleted

## 2023-12-11 ENCOUNTER — Ambulatory Visit: Payer: Self-pay | Admitting: General Surgery

## 2023-12-11 LAB — SURGICAL PATHOLOGY

## 2023-12-12 ENCOUNTER — Encounter: Payer: Self-pay | Admitting: *Deleted

## 2023-12-18 ENCOUNTER — Inpatient Hospital Stay: Attending: Hematology and Oncology | Admitting: Hematology and Oncology

## 2023-12-18 ENCOUNTER — Encounter: Payer: Self-pay | Admitting: *Deleted

## 2023-12-18 VITALS — BP 126/64 | HR 48 | Temp 98.4°F | Resp 16 | Wt 146.9 lb

## 2023-12-18 DIAGNOSIS — Z170 Estrogen receptor positive status [ER+]: Secondary | ICD-10-CM

## 2023-12-18 DIAGNOSIS — C50412 Malignant neoplasm of upper-outer quadrant of left female breast: Secondary | ICD-10-CM | POA: Insufficient documentation

## 2023-12-18 NOTE — Assessment & Plan Note (Signed)
 11/18/2023:Screening mammogram detected focal asymmetry in the left breast 5 mm by ultrasound measured 0.6 cm at 1 o'clock position 6 cm from the nipple.  Biopsy revealed grade 2 IDC no LVI, ER 100%, PR 1%, HER2 negative, Ki67 5%  12/04/23: Left lumpectomy: Grade 2 IDC margins Neg, ER 100%, PR: 1%, Her 2 1+, K 67: 5%  Pathology counseling: I discussed the final pathology report of the patient provided  a copy of this report. I discussed the margins as well as lymph node surgeries. We also discussed the final staging along with previously performed ER/PR and HER-2/neu testing.  Treatment Plan: Plus or minus adjuvant radiation Antiestrogen therapy  RTC after radiation

## 2023-12-18 NOTE — Progress Notes (Signed)
 Patient Care Team: Earvin Johnston PARAS, FNP as PCP - General (Family Medicine) Anner Alm ORN, MD as PCP - Cardiology (Cardiology) Tyree Nanetta SAILOR, RN as Oncology Nurse Navigator Gerome, Devere HERO, RN as Oncology Nurse Navigator Aron Shoulders, MD as Consulting Physician (General Surgery) Odean Potts, MD as Consulting Physician (Hematology and Oncology) Izell Domino, MD as Attending Physician (Radiation Oncology)  DIAGNOSIS:  Encounter Diagnosis  Name Primary?   Malignant neoplasm of upper-outer quadrant of left breast in female, estrogen receptor positive (HCC) Yes    SUMMARY OF ONCOLOGIC HISTORY: Oncology History  Malignant neoplasm of upper-outer quadrant of left breast in female, estrogen receptor positive (HCC)  11/18/2023 Initial Diagnosis   Screening mammogram detected focal asymmetry in the left breast 5 mm by ultrasound measured 0.6 cm at 1 o'clock position 6 cm from the nipple.  Biopsy revealed grade 2 IDC no LVI, ER 100%, PR 1%, HER2 negative, Ki67 5%   12/04/2023 Surgery   Left lumpectomy: Grade 2 IDC margins Neg, ER 100%, PR: 1%, Her 2 1+, K 67: 5%      CHIEF COMPLIANT: Follow-up after recent left lumpectomy  HISTORY OF PRESENT ILLNESS: History of Present Illness Morgan Swanson is a 73 year old female with estrogen receptor-positive breast cancer who presents for post-surgical follow-up.  She underwent surgery for breast cancer, with a pathology report indicating a tumor size of 0.9 centimeters and intermediate grade. The resection margins are negative, and there is no evidence of lymphovascular or perineural invasion.     ALLERGIES:  is allergic to lisinopril, penicillins, and codeine.  MEDICATIONS:  Current Outpatient Medications  Medication Sig Dispense Refill   amLODipine  (NORVASC ) 10 MG tablet Take 10 mg by mouth daily.     aspirin  81 MG chewable tablet Chew 1 tablet (81 mg total) by mouth 2 (two) times daily. (Patient taking differently: Chew 81 mg by  mouth daily.) 30 tablet 0   Aspirin -Caffeine (BC FAST PAIN RELIEF PO) Take 1 packet by mouth daily as needed (pain).     baclofen  (LIORESAL ) 10 MG tablet Take 1 tablet (10 mg total) by mouth at bedtime as needed for muscle spasms. 30 each 3   busPIRone  (BUSPAR ) 15 MG tablet Take 15 mg by mouth 2 (two) times daily.     CRESTOR  20 MG tablet Take 20 mg by mouth daily.     fentaNYL  (DURAGESIC ) 12 MCG/HR Place 1 patch onto the skin every 3 (three) days. Patient has drug agreement, 10 patch 0   gabapentin  (NEURONTIN ) 100 MG capsule Take 1 capsule (100 mg total) by mouth 3 (three) times daily. 270 capsule 4   metoprolol  tartrate (LOPRESSOR ) 25 MG tablet TAKE 1 TABLET(25 MG) BY MOUTH TWICE DAILY 180 tablet 0   omeprazole (PRILOSEC) 40 MG capsule Take 40 mg by mouth daily.     oxyCODONE  (OXY IR/ROXICODONE ) 5 MG immediate release tablet Take 1-2 tablets (5-10 mg total) by mouth every 6 (six) hours as needed for moderate pain (pain score 4-6) (pain score 4-6). 30 tablet 0   sertraline  (ZOLOFT ) 25 MG tablet Take 25 mg by mouth daily.     tiZANidine  (ZANAFLEX ) 4 MG tablet Take 1 tablet (4 mg total) by mouth every 6 (six) hours as needed for muscle spasms. 30 tablet 0   triamterene -hydrochlorothiazide  (MAXZIDE -25) 37.5-25 MG tablet Take 0.5 tablets by mouth daily.  0   No current facility-administered medications for this visit.    PHYSICAL EXAMINATION: ECOG PERFORMANCE STATUS: 1 - Symptomatic but completely  ambulatory  Vitals:   12/18/23 1123  BP: 126/64  Pulse: (!) 48  Resp: 16  Temp: 98.4 F (36.9 C)  SpO2: 100%   Filed Weights   12/18/23 1123  Weight: 146 lb 14.4 oz (66.6 kg)      LABORATORY DATA:  I have reviewed the data as listed    Latest Ref Rng & Units 11/20/2023   12:32 PM 05/08/2023    5:51 AM 05/02/2023    3:00 PM  CMP  Glucose 70 - 99 mg/dL 81  882  94   BUN 8 - 23 mg/dL 17  13  16    Creatinine 0.44 - 1.00 mg/dL 8.97  9.24  8.91   Sodium 135 - 145 mmol/L 139  138  139    Potassium 3.5 - 5.1 mmol/L 3.6  4.2  3.7   Chloride 98 - 111 mmol/L 104  101  101   CO2 22 - 32 mmol/L 28  26  24    Calcium  8.9 - 10.3 mg/dL 9.4  9.3  89.7   Total Protein 6.5 - 8.1 g/dL 7.8     Total Bilirubin 0.0 - 1.2 mg/dL 0.4     Alkaline Phos 38 - 126 U/L 78     AST 15 - 41 U/L 19     ALT 0 - 44 U/L 12       Lab Results  Component Value Date   WBC 3.9 (L) 11/20/2023   HGB 12.2 11/20/2023   HCT 36.9 11/20/2023   MCV 90.2 11/20/2023   PLT 288 11/20/2023   NEUTROABS 1.9 11/20/2023    ASSESSMENT & PLAN:  Malignant neoplasm of upper-outer quadrant of left breast in female, estrogen receptor positive (HCC) 11/18/2023:Screening mammogram detected focal asymmetry in the left breast 5 mm by ultrasound measured 0.6 cm at 1 o'clock position 6 cm from the nipple.  Biopsy revealed grade 2 IDC no LVI, ER 100%, PR 1%, HER2 negative, Ki67 5%  12/04/23: Left lumpectomy: Grade 2 IDC margins Neg, ER 100%, PR: 1%, Her 2 1+, K 67: 5%  Pathology counseling: I discussed the final pathology report of the patient provided  a copy of this report. I discussed the margins as well as lymph node surgeries. We also discussed the final staging along with previously performed ER/PR and HER-2/neu testing.  Treatment Plan: Plus or minus adjuvant radiation Antiestrogen therapy  RTC after radiation  ------------------------------------- Assessment and Plan Assessment & Plan Estrogen receptor positive malignant neoplasm of upper-outer quadrant of left breast, post-surgical management Post-surgical management of estrogen receptor positive malignant neoplasm. Tumor 0.9 cm, intermediate grade, negative margins, no lymphovascular or perineural invasion. Estrogen receptor positive, stage consistent with previous findings. She preferred to avoid radiation therapy. - Refer to radiation oncology for consultation on radiation therapy benefits. - Discuss her concerns with radiation oncologist and provide updated  radiation practices information. - Initiate anastrozole 1 mg daily for five years post-radiation decision. - Monitor anastrozole side effects: hot flashes, joint stiffness, bone loss. - Advise calcium , vitamin D supplementation, and regular exercise for bone health. - Schedule biennial bone density test for osteoporosis monitoring.      No orders of the defined types were placed in this encounter.  The patient has a good understanding of the overall plan. she agrees with it. she will call with any problems that may develop before the next visit here. Total time spent: 30 mins including face to face time and time spent for planning, charting and co-ordination of care  Viinay K Elisabella Hacker, MD 12/18/23

## 2023-12-23 ENCOUNTER — Inpatient Hospital Stay
Admission: RE | Admit: 2023-12-23 | Discharge: 2023-12-23 | Disposition: A | Discharge status: Home / Self Care | Payer: Self-pay | Source: Ambulatory Visit | Attending: Radiation Oncology | Admitting: Radiation Oncology

## 2023-12-23 ENCOUNTER — Other Ambulatory Visit: Payer: Self-pay | Admitting: Radiation Oncology

## 2023-12-23 DIAGNOSIS — Z170 Estrogen receptor positive status [ER+]: Secondary | ICD-10-CM

## 2023-12-24 NOTE — Progress Notes (Signed)
 Location of Breast Cancer: Malignant Neoplasm of Upper-Outer Quadrant of Left Breast, Estrogen Receptor Positive   Histology per Pathology Report:    Receptor Status: ER(100%), PR (1%), Her2-neu (Negative), Ki-67(5%)  Did patient present with symptoms (if so, please note symptoms) or was this found on screening mammography?:  Mammogram  Past/Anticipated interventions by surgeon, if any: 12/04/2023 Aron, MD Left Breast Seed Localized Lumpectomy   Past/Anticipated interventions by medical oncology, if any:  12/18/2023 Odean, MD  Lymphedema issues, if any:   None   Pain issues, if any:  None  SAFETY ISSUES: Prior radiation? None Pacemaker/ICD? None Pain pump? None Possible current pregnancy? None Is the patient on methotrexate? None  Current Complaints / other details: None

## 2023-12-26 ENCOUNTER — Encounter: Payer: Self-pay | Admitting: *Deleted

## 2023-12-30 NOTE — Progress Notes (Incomplete)
 Radiation Oncology         [phone] ________________________________  Name: Morgan Swanson MRN: [id number]  Date: 12/31/2023  DOB: Sep 28, 1950  Follow-Up Visit Note  Outpatient  CC: Earvin Johnston PARAS, FNP  Odean Potts, MD  Diagnosis:   No diagnosis found. ***  Stage pT1bNx invasive ductal carcinoma   Malignant neoplasm of upper-outer quadrant of left breast in female, estrogen receptor positive (CMS/HHS-HCC)  CHIEF COMPLAINT: Here to discuss management of left breast cancer  Narrative:  The patient returns today for follow-up. She was last seen in the breast clinic on 11/20/23.      Since consultation date, she underwent genetic testing on 11/20/23 showing    Patient opted to proceed with a left breat lumpectomy with magnetic marker localization on 12/04/23 under the care of Dr. Aron. Surgical pathology revealed: tumor size of 0.9 cm; histology of grade 2 invasive ductal carcinoma with no evidence of lymphovascular or perineural invasion; all margins are negative for carcinoma; nodal status of not applicable;  ER status: 100%, positive, strong staining intensity; PR status 1%, positive, weak-moderate staining intensity, Her2 status negative 1+; Grade 2, Ki-67: 5%.   Patient presented for a follow up with Dr. Gudena on 12/18/23 where they discussed undergoing radiation therapy which is followed by initiation of anastrozole 1 mg daily for five years.    Most recent post-op follow up with Dr. Aron where she reported recovering from procedure quite well. Further treatment steps will be decided following her radiation consultation.    Symptomatically, the patient reports: ***        ALLERGIES:  is allergic to lisinopril, penicillins, and codeine.  Meds: Current Outpatient Medications  Medication Sig Dispense Refill   amLODipine  (NORVASC ) 10 MG tablet Take 10 mg by mouth daily.     aspirin  81 MG chewable tablet Chew 1 tablet (81 mg total) by mouth 2 (two) times daily. (Patient  taking differently: Chew 81 mg by mouth daily.) 30 tablet 0   Aspirin -Caffeine (BC FAST PAIN RELIEF PO) Take 1 packet by mouth daily as needed (pain).     baclofen  (LIORESAL ) 10 MG tablet Take 1 tablet (10 mg total) by mouth at bedtime as needed for muscle spasms. 30 each 3   busPIRone  (BUSPAR ) 15 MG tablet Take 15 mg by mouth 2 (two) times daily.     CRESTOR  20 MG tablet Take 20 mg by mouth daily.     fentaNYL  (DURAGESIC ) 12 MCG/HR Place 1 patch onto the skin every 3 (three) days. Patient has drug agreement, 10 patch 0   gabapentin  (NEURONTIN ) 100 MG capsule Take 1 capsule (100 mg total) by mouth 3 (three) times daily. 270 capsule 4   metoprolol  tartrate (LOPRESSOR ) 25 MG tablet TAKE 1 TABLET(25 MG) BY MOUTH TWICE DAILY 180 tablet 0   omeprazole (PRILOSEC) 40 MG capsule Take 40 mg by mouth daily.     oxyCODONE  (OXY IR/ROXICODONE ) 5 MG immediate release tablet Take 1-2 tablets (5-10 mg total) by mouth every 6 (six) hours as needed for moderate pain (pain score 4-6) (pain score 4-6). 30 tablet 0   sertraline  (ZOLOFT ) 25 MG tablet Take 25 mg by mouth daily.     tiZANidine  (ZANAFLEX ) 4 MG tablet Take 1 tablet (4 mg total) by mouth every 6 (six) hours as needed for muscle spasms. 30 tablet 0   triamterene -hydrochlorothiazide  (MAXZIDE -25) 37.5-25 MG tablet Take 0.5 tablets by mouth daily.  0   No current facility-administered medications for this visit.  Physical Findings:  vitals were not taken for this visit. .     General: Alert and oriented, in no acute distress HEENT: Head is normocephalic. Extraocular movements are intact. Oropharynx is clear. Neck: Neck is supple, no palpable cervical or supraclavicular lymphadenopathy. Heart: Regular in rate and rhythm with no murmurs, rubs, or gallops. Chest: Clear to auscultation bilaterally, with no rhonchi, wheezes, or rales. Abdomen: Soft, nontender, nondistended, with no rigidity or guarding. Extremities: No cyanosis or edema. Lymphatics: see  Neck Exam Musculoskeletal: symmetric strength and muscle tone throughout. Neurologic: No obvious focalities. Speech is fluent.  Psychiatric: Judgment and insight are intact. Affect is appropriate. Breast exam reveals ***  Lab Findings: Lab Results  Component Value Date   WBC 3.9 (L) 11/20/2023   HGB 12.2 11/20/2023   HCT 36.9 11/20/2023   MCV 90.2 11/20/2023   PLT 288 11/20/2023    @LASTCHEMISTRY @  Radiographic Findings: No results found.  Impression/Plan: We discussed adjuvant radiotherapy today.  I recommend *** in order to ***.  I reviewed the logistics, benefits, risks, and potential side effects of this treatment in detail. Risks may include but not necessary be limited to acute and late injury tissue in the radiation fields such as skin irritation (change in color/pigmentation, itching, dryness, pain, peeling). She may experience fatigue. We also discussed possible risk of long term cosmetic changes or scar tissue. There is also a smaller risk for lung toxicity, ***cardiac toxicity, ***brachial plexopathy, ***lymphedema, ***musculoskeletal changes, ***rib fragility or ***induction of a second malignancy, ***late chronic non-healing soft tissue wound.    The patient asked good questions which I answered to her satisfaction. She is enthusiastic about proceeding with treatment. A consent form has been *** signed and placed in her chart.  A total of *** medically necessary complex treatment devices will be fabricated and supervised by me: *** fields with MLCs for custom blocks to protect heart, and lungs;  and, a Vac-lok. MORE COMPLEX DEVICES MAY BE MADE IN DOSIMETRY FOR FIELD IN FIELD BEAMS FOR DOSE HOMOGENEITY.  I have requested : 3D Simulation which is medically necessary to give adequate dose to at risk tissues while sparing lungs and heart.  I have requested a DVH of the following structures: lungs, heart, *** lumpectomy cavity.    The patient will receive *** Gy in *** fractions  to the *** with *** fields.  This will be *** followed by a boost.  On date of service, in total, I spent *** minutes on this encounter. Patient was seen in person.  _____________________________________   Lauraine Golden, MD  This document serves as a record of services personally performed by Lauraine Golden, MD. It was created on her behalf by Reymundo Cartwright, a trained medical scribe. The creation of this record is based on the scribe's personal observations and the provider's statements to them. This document has been checked and approved by the attending provider.

## 2023-12-31 ENCOUNTER — Encounter: Payer: Self-pay | Admitting: Radiation Oncology

## 2023-12-31 ENCOUNTER — Ambulatory Visit
Admission: RE | Admit: 2023-12-31 | Discharge: 2023-12-31 | Disposition: A | Discharge status: Home / Self Care | Source: Ambulatory Visit | Attending: Radiation Oncology | Admitting: Radiation Oncology

## 2023-12-31 DIAGNOSIS — C50412 Malignant neoplasm of upper-outer quadrant of left female breast: Secondary | ICD-10-CM

## 2024-01-03 ENCOUNTER — Ambulatory Visit: Admitting: Radiation Oncology

## 2024-01-06 ENCOUNTER — Inpatient Hospital Stay: Attending: Hematology and Oncology | Admitting: Hematology and Oncology

## 2024-01-06 DIAGNOSIS — C50412 Malignant neoplasm of upper-outer quadrant of left female breast: Secondary | ICD-10-CM | POA: Diagnosis not present

## 2024-01-06 DIAGNOSIS — Z170 Estrogen receptor positive status [ER+]: Secondary | ICD-10-CM | POA: Diagnosis not present

## 2024-01-06 MED ORDER — ANASTROZOLE 1 MG PO TABS: 1.0000 mg | ORAL_TABLET | Freq: Every day | ORAL | 3 refills | Status: AC

## 2024-01-06 NOTE — Progress Notes (Signed)
 HEMATOLOGY-ONCOLOGY TELEPHONE VISIT PROGRESS NOTE  I connected with our patient on 01/06/24 at  3:30 PM EDT by telephone and verified that I am speaking with the correct person using two identifiers.  I discussed the limitations, risks, security and privacy concerns of performing an evaluation and management service by telephone and the availability of in person appointments.  I also discussed with the patient that there may be a patient responsible charge related to this service. The patient expressed understanding and agreed to proceed.   History of Present Illness: Telephone follow-up to discuss antiestrogen therapy  History of Present Illness Morgan Swanson is a 73 year old female who presents for follow-up after surgery and to discuss hormone therapy.  She experiences hot flashes. She has healed and recovered from her surgery and returned to work today.    Oncology History  Malignant neoplasm of upper-outer quadrant of left breast in female, estrogen receptor positive (HCC)  11/18/2023 Initial Diagnosis   Screening mammogram detected focal asymmetry in the left breast 5 mm by ultrasound measured 0.6 cm at 1 o'clock position 6 cm from the nipple.  Biopsy revealed grade 2 IDC no LVI, ER 100%, PR 1%, HER2 negative, Ki67 5%   12/04/2023 Surgery   Left lumpectomy: Grade 2 IDC margins Neg, ER 100%, PR: 1%, Her 2 1+, K 67: 5%      REVIEW OF SYSTEMS:   Constitutional: Denies fevers, chills or abnormal weight loss All other systems were reviewed with the patient and are negative. Observations/Objective:     Assessment Plan:  Malignant neoplasm of upper-outer quadrant of left breast in female, estrogen receptor positive (HCC) 11/18/2023:Screening mammogram detected focal asymmetry in the left breast 5 mm by ultrasound measured 0.6 cm at 1 o'clock position 6 cm from the nipple.  Biopsy revealed grade 2 IDC no LVI, ER 100%, PR 1%, HER2 negative, Ki67 5%  12/04/23: Left lumpectomy: Grade 2 IDC  margins Neg, ER 100%, PR: 1%, Her 2 1+, K 67: 5%    Treatment Plan: Decided against adjuvant radiation Antiestrogen therapy with anastrozole 1 mg daily x 5 years   Anastrozole counseling: We discussed the risks and benefits of anti-estrogen therapy with aromatase inhibitors. These include but not limited to insomnia, hot flashes, mood changes, vaginal dryness, bone density loss, and weight gain. We strongly believe that the benefits far outweigh the risks. Patient understands these risks and consented to starting treatment. Planned treatment duration is 5 years.  RTC in 3 weeks for survivorship care plan visit     I discussed the assessment and treatment plan with the patient. The patient was provided an opportunity to ask questions and all were answered. The patient agreed with the plan and demonstrated an understanding of the instructions. The patient was advised to call back or seek an in-person evaluation if the symptoms worsen or if the condition fails to improve as anticipated.   I provided 20 minutes of non-face-to-face time during this encounter.  This includes time for charting and coordination of care   Naomi MARLA Chad, MD

## 2024-01-06 NOTE — Assessment & Plan Note (Addendum)
 11/18/2023:Screening mammogram detected focal asymmetry in the left breast 5 mm by ultrasound measured 0.6 cm at 1 o'clock position 6 cm from the nipple.  Biopsy revealed grade 2 IDC no LVI, ER 100%, PR 1%, HER2 negative, Ki67 5%  12/04/23: Left lumpectomy: Grade 2 IDC margins Neg, ER 100%, PR: 1%, Her 2 1+, K 67: 5%    Treatment Plan: Decided against adjuvant radiation Antiestrogen therapy with anastrozole 1 mg daily x 5 years   RTC in 3 weeks for survivorship care plan visit

## 2024-01-07 ENCOUNTER — Encounter: Payer: Self-pay | Admitting: *Deleted

## 2024-01-07 DIAGNOSIS — C50412 Malignant neoplasm of upper-outer quadrant of left female breast: Secondary | ICD-10-CM

## 2024-01-30 ENCOUNTER — Other Ambulatory Visit: Payer: Self-pay

## 2024-01-30 ENCOUNTER — Ambulatory Visit: Admitting: Orthopaedic Surgery

## 2024-01-30 ENCOUNTER — Encounter: Payer: Self-pay | Admitting: Orthopaedic Surgery

## 2024-01-30 DIAGNOSIS — G8929 Other chronic pain: Secondary | ICD-10-CM

## 2024-01-30 DIAGNOSIS — M1712 Unilateral primary osteoarthritis, left knee: Secondary | ICD-10-CM | POA: Insufficient documentation

## 2024-01-30 DIAGNOSIS — Z96651 Presence of right artificial knee joint: Secondary | ICD-10-CM | POA: Diagnosis not present

## 2024-01-30 DIAGNOSIS — M25562 Pain in left knee: Secondary | ICD-10-CM

## 2024-01-30 NOTE — Progress Notes (Signed)
 The patient is a 73 year old female who is being seen in follow-up at almost 9 months status post a right total knee replacement to treat significant right knee pain and arthritis.  She says that her right total knee is doing very well.  Her left knee has been very uncomfortable for her.  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  Her right knee is nice and straight.  Her range of motion is full.  Her left knee has varus malalignment with significant medial joint line tenderness and patellofemoral crepitation.  Her left knee is stable and very painful throughout the arc of motion.  X-rays today standing show the right total knee replacement as well as her left knee.  Her right knee replacement is well-seated with good alignment.  Her left knee has bone-on-bone arthritis with medial compartment narrowing that is bone-on-bone, varus malalignment and patellofemoral bone-on-bone arthritis.  At this point she would like to proceed with a left total knee arthroplasty.  She has tried and failed conservative treatment for over a year for her left knee as well including all modes of treatment such as NSAIDs, injections and therapy.  Having had this before in the right knee earlier this year she is fully aware of the risks and benefits of the surgery with expect from an intraoperative and postoperative standpoint.  Given her daily knee pain on the left side that is now detriment affecting her mobility, her quality of life and actives daily living she has really wanting to proceed with surgery and we reviewed this as well.  Will be in touch with scheduling her for a left total knee replacement.

## 2024-02-03 ENCOUNTER — Encounter: Payer: Self-pay | Admitting: Radiology

## 2024-02-24 ENCOUNTER — Other Ambulatory Visit: Payer: Self-pay | Admitting: Physician Assistant

## 2024-02-24 DIAGNOSIS — Z01818 Encounter for other preprocedural examination: Secondary | ICD-10-CM

## 2024-03-06 NOTE — Progress Notes (Signed)
 Surgical Instructions   Your procedure is scheduled on Tuesday, December 9th. Report to [facility] Main Entrance A at 6:45 A.M., then check in with the Admitting office. Any questions or running late day of surgery: call [phone]  Questions prior to your surgery date: call [phone], Monday-Friday, 8am-4pm. If you experience any cold or flu symptoms such as cough, fever, chills, shortness of breath, etc. between now and your scheduled surgery, please notify us  at the above number.     Remember:  Do not eat after midnight the night before your surgery.  You may drink clear liquids until 5:45 the morning of your surgery.   Clear liquids allowed are: Water, Non-Citrus Juices (without pulp), Carbonated Beverages, Clear Tea (no milk, honey, etc.), Black Coffee Only (NO MILK, CREAM OR POWDERED CREAMER of any kind), and Gatorade.    Take these medicines the morning of surgery with A SIP OF WATER  amLODipine  (NORVASC )  anastrozole  (ARIMIDEX )  busPIRone  (BUSPAR )  CRESTOR   metoprolol  tartrate (LOPRESSOR )  omeprazole (PRILOSEC)  sertraline  (ZOLOFT )   May take these medicines IF NEEDED: baclofen  (LIORESAL )  gabapentin  (NEURONTIN )  tiZANidine  (ZANAFLEX )   Follow your surgeon's instructions on when to stop Asprin.  If no instructions were given by your surgeon then you will need to call the office to get those instructions.     One week prior to surgery, STOP taking any Aleve, Naproxen, Ibuprofen, Motrin, Advil, Goody's, all herbal medications, fish oil, and non-prescription vitamins. This includes your Aspirin -Caffeine (BC PAIN RELIEF).                      Do NOT Smoke (Tobacco/Vaping) for 24 hours prior to your procedure.  If you use a CPAP at night, you may bring your mask/headgear for your overnight stay.   You will be asked to remove any contacts, glasses, piercing's, hearing aid's, dentures/partials prior to surgery. Please bring cases for these items if needed.     Patients discharged the day of surgery will not be allowed to drive home, and someone needs to stay with them for 24 hours.  SURGICAL WAITING ROOM VISITATION Patients may have no more than 2 support people in the waiting area - these visitors may rotate.   Pre-op nurse will coordinate an appropriate time for 1 ADULT support person, who may not rotate, to accompany patient in pre-op.  Children under the age of 72 must have an adult with them who is not the patient and must remain in the main waiting area with an adult.  If the patient needs to stay at the hospital during part of their recovery, the visitor guidelines for inpatient rooms apply.  Please refer to the [facility] website for the visitor guidelines for any additional information.   If you received a COVID test during your pre-op visit  it is requested that you wear a mask when out in public, stay away from anyone that may not be feeling well and notify your surgeon if you develop symptoms. If you have been in contact with anyone that has tested positive in the last 10 days please notify you surgeon.      Pre-operative 4 CHG Bathing Instructions   You can play a key role in reducing the risk of infection after surgery. Your skin needs to be as free of germs as possible. You can reduce the number of germs on your skin by washing with CHG (chlorhexidine  gluconate) soap before surgery. CHG is an antiseptic soap that  kills germs and continues to kill germs even after washing.   DO NOT use if you have an allergy to chlorhexidine /CHG or antibacterial soaps. If your skin becomes reddened or irritated, stop using the CHG and notify one of our RNs at [phone].   Please shower with the CHG soap starting 4 days before surgery using the following schedule:     Please keep in mind the following:  DO NOT shave, including legs and underarms, starting the day of your first shower.   You may shave your face at any point before/day of  surgery.  Place clean sheets on your bed the day you start using CHG soap. Use a clean washcloth (not used since being washed) for each shower. DO NOT sleep with pets once you start using the CHG.   CHG Shower Instructions:  Wash your face and private area with normal soap. If you choose to wash your hair, wash first with your normal shampoo.  After you use shampoo/soap, rinse your hair and body thoroughly to remove shampoo/soap residue.  Turn the water OFF and apply  bottle of CHG soap to a CLEAN washcloth.  Apply CHG soap ONLY FROM YOUR NECK DOWN TO YOUR TOES (washing for 3-5 minutes)  DO NOT use CHG soap on face, private areas, open wounds, or sores.  Pay special attention to the area where your surgery is being performed.  If you are having back surgery, having someone wash your back for you may be helpful. Wait 2 minutes after CHG soap is applied, then you may rinse off the CHG soap.  Pat dry with a clean towel  Put on clean clothes/pajamas   If you choose to wear lotion, please use ONLY the CHG-compatible lotions that are listed below.  Additional instructions for the day of surgery:  If you choose, you may shower the morning of surgery with an antibacterial soap.  DO NOT APPLY any lotions, deodorants, or perfumes.   Do not bring valuables to the hospital. [facility] is not responsible for any belongings/valuables. Do not wear nail polish, gel polish, artificial nails, or any other type of covering on natural nails (fingers and toes) Do not wear jewelry or makeup Put on clean/comfortable clothes.  Please brush your teeth.  Ask your nurse before applying any prescription medications to the skin.     CHG Compatible Lotions   Aveeno Moisturizing lotion  Cetaphil Moisturizing Cream  Cetaphil Moisturizing Lotion  Clairol Herbal Essence Moisturizing Lotion, Dry Skin  Clairol Herbal Essence Moisturizing Lotion, Extra Dry Skin  Clairol Herbal Essence Moisturizing Lotion, Normal  Skin  Curel Age Defying Therapeutic Moisturizing Lotion with Alpha Hydroxy  Curel Extreme Care Body Lotion  Curel Soothing Hands Moisturizing Hand Lotion  Curel Therapeutic Moisturizing Cream, Fragrance-Free  Curel Therapeutic Moisturizing Lotion, Fragrance-Free  Curel Therapeutic Moisturizing Lotion, Original Formula  Eucerin Daily Replenishing Lotion  Eucerin Dry Skin Therapy Plus Alpha Hydroxy Crme  Eucerin Dry Skin Therapy Plus Alpha Hydroxy Lotion  Eucerin Original Crme  Eucerin Original Lotion  Eucerin Plus Crme Eucerin Plus Lotion  Eucerin TriLipid Replenishing Lotion  Keri Anti-Bacterial Hand Lotion  Keri Deep Conditioning Original Lotion Dry Skin Formula Softly Scented  Keri Deep Conditioning Original Lotion, Fragrance Free Sensitive Skin Formula  Keri Lotion Fast Absorbing Fragrance Free Sensitive Skin Formula  Keri Lotion Fast Absorbing Softly Scented Dry Skin Formula  Keri Original Lotion  Keri Skin Renewal Lotion Keri Silky Smooth Lotion  Keri Silky Smooth Sensitive Skin Lotion  Nivea Body  Creamy Conditioning Oil  Nivea Body Extra Enriched Lotion  Nivea Body Original Lotion  Nivea Body Sheer Moisturizing Lotion Nivea Crme  Nivea Skin Firming Lotion  NutraDerm 30 Skin Lotion  NutraDerm Skin Lotion  NutraDerm Therapeutic Skin Cream  NutraDerm Therapeutic Skin Lotion  ProShield Protective Hand Cream  Provon moisturizing lotion  Please read over the following fact sheets that you were given.

## 2024-03-09 ENCOUNTER — Encounter (HOSPITAL_COMMUNITY): Payer: Self-pay

## 2024-03-09 ENCOUNTER — Other Ambulatory Visit: Payer: Self-pay

## 2024-03-09 ENCOUNTER — Inpatient Hospital Stay (HOSPITAL_COMMUNITY): Admission: RE | Admit: 2024-03-09 | Discharge: 2024-03-09 | Attending: Orthopaedic Surgery

## 2024-03-09 VITALS — BP 155/83 | HR 94 | Temp 98.5°F | Resp 18 | Ht 62.0 in | Wt 139.7 lb

## 2024-03-09 DIAGNOSIS — Z01818 Encounter for other preprocedural examination: Secondary | ICD-10-CM

## 2024-03-09 LAB — BASIC METABOLIC PANEL WITH GFR
Anion gap: 11 (ref 5–15)
BUN: 16 mg/dL (ref 8–23)
CO2: 26 mmol/L (ref 22–32)
Calcium: 9.3 mg/dL (ref 8.9–10.3)
Chloride: 102 mmol/L (ref 98–111)
Creatinine, Ser: 0.95 mg/dL (ref 0.44–1.00)
GFR, Estimated: >60 mL/min (ref >60)
Glucose, Bld: 104 mg/dL — ABNORMAL HIGH (ref 70–99)
Potassium: 4 mmol/L (ref 3.5–5.1)
Sodium: 139 mmol/L (ref 135–145)

## 2024-03-09 LAB — CBC
HCT: 42.2 % (ref 36.0–46.0)
Hemoglobin: 13.9 g/dL (ref 12.0–15.0)
MCH: 30 pg (ref 26.0–34.0)
MCHC: 32.9 g/dL (ref 30.0–36.0)
MCV: 90.9 fL (ref 80.0–100.0)
Platelets: 309 K/uL (ref 150–400)
RBC: 4.64 MIL/uL (ref 3.87–5.11)
RDW: 13.5 % (ref 11.5–15.5)
WBC: 7.6 K/uL (ref 4.0–10.5)
nRBC: 0 % (ref 0.0–0.2)

## 2024-03-09 LAB — SURGICAL PCR SCREEN

## 2024-03-09 LAB — TYPE AND SCREEN
ABO/RH(D): A POS
Antibody Screen: NEGATIVE

## 2024-03-09 NOTE — Progress Notes (Signed)
 PCP - Johnston Olea, FNP Cardiologist - Dr. Alm Clay Oncologist: Dr. Mackey Chad  PPM/ICD - denies Device Orders - na Rep Notified - na  Chest x-ray - na EKG - 05/02/2023 Stress Test -  ECHO - 10/04/2016 Cardiac Cath -   Sleep Study - denies CPAP - na  Non-diabetic  Blood Thinner Instructions: denies Aspirin  Instructions: States is not taking  ERAS Protcol - Ensure until 0545  Anesthesia review: Yes. Cold like symptoms developed Thursday; cough, congestion, runny nose, earache, stuffy head.   Patient denies shortness of breath, fever, cough and chest pain at PAT appointment   All instructions explained to the patient, with a verbal understanding of the material. Patient agrees to go over the instructions while at home for a better understanding. Patient also instructed to self quarantine after being tested for COVID-19. The opportunity to ask questions was provided.

## 2024-03-09 NOTE — Progress Notes (Addendum)
 Surgical Instructions   Your procedure is scheduled on Tuesday, December 9th. Report to [facility] Main Entrance A at 6:45 A.M., then check in with the Admitting office. Any questions or running late day of surgery: call [phone]  Questions prior to your surgery date: call [phone], Monday-Friday, 8am-4pm. If you experience any cold or flu symptoms such as cough, fever, chills, shortness of breath, etc. between now and your scheduled surgery, please notify us  at the above number.     Remember:  Do not eat after midnight the night before your surgery.  You may drink clear liquids until 5:45 the morning of your surgery.   Clear liquids allowed are: Water, Non-Citrus Juices (without pulp), Carbonated Beverages, Clear Tea (no milk, honey, etc.), Black Coffee Only (NO MILK, CREAM OR POWDERED CREAMER of any kind), and Gatorade.  Patient Instructions  The night before surgery:  No food after midnight. ONLY clear liquids after midnight  The day of surgery (if you do NOT have diabetes):  Drink ONE (1) Pre-Surgery Clear Ensure by 5:45 the morning of surgery. Drink in one sitting. Do not sip.  This drink was given to you during your hospital  pre-op appointment visit.  Nothing else to drink after completing the  Pre-Surgery Clear Ensure.       If you have questions, please contact your surgeon's office.   Take these medicines the morning of surgery with A SIP OF WATER  amLODipine  (NORVASC )  busPIRone  (BUSPAR )  CRESTOR   gabapentin  (NEURONTIN )  metoprolol  tartrate (LOPRESSOR )  omeprazole (PRILOSEC)  sertraline  (ZOLOFT )   May take these medicines IF NEEDED:   Follow your surgeon's instructions on when to stop Asprin.  If no instructions were given by your surgeon then you will need to call the office to get those instructions.     One week prior to surgery, STOP taking any Aleve, Naproxen, Ibuprofen, Motrin, Advil, Goody's, all herbal medications, fish oil, and  non-prescription vitamins. This includes your Aspirin -Caffeine (BC PAIN RELIEF).                      Do NOT Smoke (Tobacco/Vaping) for 24 hours prior to your procedure.  If you use a CPAP at night, you may bring your mask/headgear for your overnight stay.   You will be asked to remove any contacts, glasses, piercing's, hearing aid's, dentures/partials prior to surgery. Please bring cases for these items if needed.    Patients discharged the day of surgery will not be allowed to drive home, and someone needs to stay with them for 24 hours.  SURGICAL WAITING ROOM VISITATION Patients may have no more than 2 support people in the waiting area - these visitors may rotate.   Pre-op nurse will coordinate an appropriate time for 1 ADULT support person, who may not rotate, to accompany patient in pre-op.  Children under the age of 45 must have an adult with them who is not the patient and must remain in the main waiting area with an adult.  If the patient needs to stay at the hospital during part of their recovery, the visitor guidelines for inpatient rooms apply.  Please refer to the [facility] website for the visitor guidelines for any additional information.   If you received a COVID test during your pre-op visit  it is requested that you wear a mask when out in public, stay away from anyone that may not be feeling well and notify your surgeon if you develop symptoms. If you  have been in contact with anyone that has tested positive in the last 10 days please notify you surgeon.      Pre-operative 4 CHG Bathing Instructions   You can play a key role in reducing the risk of infection after surgery. Your skin needs to be as free of germs as possible. You can reduce the number of germs on your skin by washing with CHG (chlorhexidine  gluconate) soap before surgery. CHG is an antiseptic soap that kills germs and continues to kill germs even after washing.   DO NOT use if you have an allergy to  chlorhexidine /CHG or antibacterial soaps. If your skin becomes reddened or irritated, stop using the CHG and notify one of our RNs at [phone].   Please shower with the CHG soap starting 4 days before surgery using the following schedule:     Please keep in mind the following:  DO NOT shave, including legs and underarms, starting the day of your first shower.   Place clean sheets on your bed the day you start using CHG soap. Use a clean washcloth (not used since being washed) for each shower. DO NOT sleep with pets once you start using the CHG.   CHG Shower Instructions:  Wash your face and private area with normal soap. If you choose to wash your hair, wash first with your normal shampoo.  After you use shampoo/soap, rinse your hair and body thoroughly to remove shampoo/soap residue.  Turn the water OFF and apply  bottle of CHG soap to a CLEAN washcloth.  Apply CHG soap ONLY FROM YOUR NECK DOWN TO YOUR TOES (washing for 3-5 minutes)  DO NOT use CHG soap on face, private areas, open wounds, or sores.  Pay special attention to the area where your surgery is being performed.  If you are having back surgery, having someone wash your back for you may be helpful. Wait 2 minutes after CHG soap is applied, then you may rinse off the CHG soap.  Pat dry with a clean towel  Put on clean clothes/pajamas   If you choose to wear lotion, please use ONLY the CHG-compatible lotions that are listed below.  Additional instructions for the day of surgery:  If you choose, you may shower the morning of surgery with an antibacterial soap.  DO NOT APPLY any lotions, deodorants, or perfumes.   Do not bring valuables to the hospital. [facility] is not responsible for any belongings/valuables. Do not wear nail polish, gel polish, artificial nails, or any other type of covering on natural nails (fingers and toes) Do not wear jewelry or makeup Put on clean/comfortable clothes.  Please brush your teeth.   Ask your nurse before applying any prescription medications to the skin.     CHG Compatible Lotions   Aveeno Moisturizing lotion  Cetaphil Moisturizing Cream  Cetaphil Moisturizing Lotion  Clairol Herbal Essence Moisturizing Lotion, Dry Skin  Clairol Herbal Essence Moisturizing Lotion, Extra Dry Skin  Clairol Herbal Essence Moisturizing Lotion, Normal Skin  Curel Age Defying Therapeutic Moisturizing Lotion with Alpha Hydroxy  Curel Extreme Care Body Lotion  Curel Soothing Hands Moisturizing Hand Lotion  Curel Therapeutic Moisturizing Cream, Fragrance-Free  Curel Therapeutic Moisturizing Lotion, Fragrance-Free  Curel Therapeutic Moisturizing Lotion, Original Formula  Eucerin Daily Replenishing Lotion  Eucerin Dry Skin Therapy Plus Alpha Hydroxy Crme  Eucerin Dry Skin Therapy Plus Alpha Hydroxy Lotion  Eucerin Original Crme  Eucerin Original Lotion  Eucerin Plus Crme Eucerin Plus Lotion  Eucerin TriLipid Replenishing Lotion  Keri Anti-Bacterial Hand Lotion  Keri Deep Conditioning Original Lotion Dry Skin Formula Softly Scented  Keri Deep Conditioning Original Lotion, Fragrance Free Sensitive Skin Formula  Keri Lotion Fast Absorbing Fragrance Free Sensitive Skin Formula  Keri Lotion Fast Absorbing Softly Scented Dry Skin Formula  Keri Original Lotion  Keri Skin Renewal Lotion Keri Silky Smooth Lotion  Keri Silky Smooth Sensitive Skin Lotion  Nivea Body Creamy Conditioning Oil  Nivea Body Extra Enriched Lotion  Nivea Body Original Lotion  Nivea Body Sheer Moisturizing Lotion Nivea Crme  Nivea Skin Firming Lotion  NutraDerm 30 Skin Lotion  NutraDerm Skin Lotion  NutraDerm Therapeutic Skin Cream  NutraDerm Therapeutic Skin Lotion  ProShield Protective Hand Cream  Provon moisturizing lotion  Please read over the following fact sheets that you were given.

## 2024-03-10 ENCOUNTER — Ambulatory Visit (HOSPITAL_COMMUNITY): Admission: RE | Admit: 2024-03-10 | Source: Home / Self Care | Admitting: Orthopaedic Surgery

## 2024-03-10 ENCOUNTER — Encounter (HOSPITAL_COMMUNITY): Admission: RE | Payer: Self-pay | Source: Home / Self Care

## 2024-03-10 ENCOUNTER — Encounter (HOSPITAL_COMMUNITY): Payer: Self-pay

## 2024-03-10 ENCOUNTER — Telehealth: Payer: Self-pay | Admitting: Orthopaedic Surgery

## 2024-03-10 ENCOUNTER — Encounter (HOSPITAL_COMMUNITY): Payer: Self-pay | Admitting: Vascular Surgery

## 2024-03-10 SURGERY — ARTHROPLASTY, KNEE, TOTAL
Anesthesia: Spinal | Site: Knee | Laterality: Left

## 2024-03-10 NOTE — Telephone Encounter (Signed)
 Received FMLA forms for pts TKA surgery originally scheduled for 03/10/24. IC patient. Advised her that I will hold on to her forms and gave her my direct number to call me when she is back on the schedule.

## 2024-03-13 ENCOUNTER — Other Ambulatory Visit: Payer: Self-pay | Admitting: Orthopaedic Surgery

## 2024-03-23 ENCOUNTER — Encounter: Admitting: Orthopaedic Surgery

## 2024-04-01 ENCOUNTER — Other Ambulatory Visit: Payer: Self-pay | Admitting: Physician Assistant

## 2024-04-01 DIAGNOSIS — Z01818 Encounter for other preprocedural examination: Secondary | ICD-10-CM

## 2024-04-07 ENCOUNTER — Inpatient Hospital Stay: Admitting: Adult Health

## 2024-04-10 ENCOUNTER — Other Ambulatory Visit: Payer: Self-pay

## 2024-04-10 ENCOUNTER — Encounter (HOSPITAL_COMMUNITY): Payer: Self-pay | Admitting: Orthopaedic Surgery

## 2024-04-10 LAB — COLOGUARD: COLOGUARD: NEGATIVE

## 2024-04-10 NOTE — Progress Notes (Signed)
 SDW call  Patient was given pre-op instructions over the phone. Patient verbalized understanding of instructions provided. Patient had a PAT appointment 03/09/2024 and surgery was rescheduled do to cold symptoms.  She states last known cough was 04/04/2024, otherwise she is feeling great.    PCP - Dr. Johnston Olea Cardiologist - Dr. Alm Clay Pulmonary:    PPM/ICD - denies Device Orders - na Rep Notified - na   Chest x-ray -  EKG -  05/02/2023 Stress Test - ECHO - 10/04/2016 Cardiac Cath -   Sleep Study/sleep apnea/CPAP: denies  Non-diabetic  Blood Thinner Instructions: denies Aspirin  Instructions:denies   ERAS Protcol - Ensure until 0830   Anesthesia review: Yes for f/u from previous cold  Your procedure is scheduled on Tuesday April 14, 2024  Report to [facility] Main Entrance A at  0900  A.M., then check in with the Admitting office.  Call this number if you have problems the morning of surgery:  [phone]   If you have any questions prior to your surgery date call [phone]: Open Monday-Friday 8am-4pm If you experience any cold or flu symptoms such as cough, fever, chills, shortness of breath, etc. between now and your scheduled surgery, please notify us  at the above number    Remember:  Do not eat after midnight the night before your surgery  You may drink clear liquids until 0830    the morning of your surgery.   Clear liquids allowed are: Water, Non-Citrus Juices (without pulp), Carbonated Beverages, Clear Tea, Black Coffee ONLY (NO MILK, CREAM OR POWDERED CREAMER of any kind), and Gatorade   Take these medicines the morning of surgery with A SIP OF WATER:  As per your PAT paperwork  As needed: As per your PAT paperwork  As of today, STOP taking any Aspirin  (unless otherwise instructed by your surgeon) Aleve, Naproxen, Ibuprofen, Motrin, Advil, Goody's, BC's, all herbal medications, fish oil, and all vitamins.

## 2024-04-13 NOTE — H&P (Signed)
 TOTAL KNEE ADMISSION H&P  Patient is being admitted for left total knee arthroplasty.  Subjective:  Chief Complaint:left knee pain.  HPI: Morgan Swanson, 74 y.o. female, has a history of pain and functional disability in the left knee due to arthritis and has failed non-surgical conservative treatments for greater than 12 weeks to includeNSAID's and/or analgesics, corticosteriod injections, viscosupplementation injections, flexibility and strengthening excercises, use of assistive devices, and activity modification.  Onset of symptoms was gradual, starting a few years ago with gradually worsening course since that time. The patient noted no past surgery on the left knee(s).  Patient currently rates pain in the left knee(s) at 10 out of 10 with activity. Patient has night pain, worsening of pain with activity and weight bearing, pain that interferes with activities of daily living, pain with passive range of motion, crepitus, and joint swelling.  Patient has evidence of subchondral sclerosis, periarticular osteophytes, and joint space narrowing by imaging studies. There is no active infection.  Patient Active Problem List   Diagnosis Date Noted   Unilateral primary osteoarthritis, left knee 01/30/2024   Malignant neoplasm of upper-outer quadrant of left breast in female, estrogen receptor positive (HCC) 11/18/2023   Status post total right knee replacement 05/07/2023   Lumbar radiculopathy 03/15/2021   Chronic back pain 07/09/2017   Essential hypertension 09/19/2016   Heart palpitations 09/19/2016   Systolic murmur 09/19/2016   Chronic bilateral low back pain with sciatica 09/10/2016   Lumbosacral root lesions 01/25/2014   Spinal stenosis, lumbar region, without neurogenic claudication 07/21/2012   Encounter for long-term (current) use of other medications 07/21/2012   Lumbosacral root lesions, not elsewhere classified 07/21/2012   Past Medical History:  Diagnosis Date   Anxiety     Arthritis    back    Back pain    Cancer (HCC)    breast cancer   Cataract    small immature   Depression    GERD (gastroesophageal reflux disease)    Hyperlipidemia    Hypertension    Spinal stenosis, lumbar region, without neurogenic claudication     Past Surgical History:  Procedure Laterality Date   30-Day Event Monitor  08/2016   15 events noted. Mostly sinus rhythm with occasional bradycardia. Low rate 50. Average rate 70 bpm. Rare PACs but no runs or couplets. No arrhythmia noted.   BREAST LUMPECTOMY Left    CHOLECYSTECTOMY     HAND SURGERY Right    fatty tissue removed    TOTAL KNEE ARTHROPLASTY Right 05/07/2023   Procedure: RIGHT TOTAL KNEE ARTHROPLASTY;  Surgeon: Vernetta Lonni GRADE, MD;  Location: [facility] OR;  Service: Orthopedics;  Laterality: Right;   TRANSTHORACIC ECHOCARDIOGRAM  12/2001; 09/2016   a.Normal LV size and function. EF 55-65%. Normal regional wall motion. No valvular lesions;; b. October 04, 2016: Normal LV size with mild LV hypertrophy. EF 55-60%. Mildly increased RV size with normal systolic function. No significant valvular abnormalities   VAGINAL HYSTERECTOMY  1983   Zio Patch Monitor  08/2021   Mostly sinus rhythm, occasional (3.3%) isolated PACs.  Multiple (157) runs of PAT: Fastest- 5&6 beats, max 184 bpm; longest 16 beats (7.7 Sec) avg rate 128 bpm; symptoms mostly noted with sinus rhythm and PACs.  137 triggers most were not SVT/PAT episodes.    No current facility-administered medications for this encounter.   Current Outpatient Medications  Medication Sig Dispense Refill Last Dose/Taking   amLODipine  (NORVASC ) 10 MG tablet Take 10 mg by mouth daily.   Taking  anastrozole  (ARIMIDEX ) 1 MG tablet Take 1 tablet (1 mg total) by mouth daily. (Patient taking differently: Take 1 mg by mouth at bedtime.) 90 tablet 3 Taking Differently   aspirin  EC 81 MG tablet Take 81 mg by mouth in the morning and at bedtime. Swallow whole.   Taking   Aspirin -Caffeine (BC  FAST PAIN RELIEF PO) Take 1 packet by mouth daily as needed (pain).   Taking As Needed   baclofen  (LIORESAL ) 10 MG tablet Take 1 tablet (10 mg total) by mouth at bedtime as needed for muscle spasms. 30 each 3 Taking As Needed   benzonatate (TESSALON) 100 MG capsule Take 100 mg by mouth 3 (three) times daily as needed for cough.   Taking As Needed   busPIRone  (BUSPAR ) 15 MG tablet Take 15 mg by mouth 2 (two) times daily.   Taking   CRESTOR  20 MG tablet Take 20 mg by mouth daily.   Taking   fentaNYL  (DURAGESIC ) 12 MCG/HR Place 1 patch onto the skin every 3 (three) days. Patient has drug agreement, 10 patch 0 Taking   gabapentin  (NEURONTIN ) 100 MG capsule Take 1 capsule (100 mg total) by mouth 3 (three) times daily. 270 capsule 4 Taking   metoprolol  tartrate (LOPRESSOR ) 25 MG tablet TAKE 1 TABLET(25 MG) BY MOUTH TWICE DAILY 180 tablet 0 Taking   omeprazole (PRILOSEC) 40 MG capsule Take 40 mg by mouth daily before breakfast.   Taking   sertraline  (ZOLOFT ) 25 MG tablet Take 25 mg by mouth daily.   Taking   tiZANidine  (ZANAFLEX ) 4 MG tablet TAKE 1 TABLET BY MOUTH EVERY 6 HOURS AS NEEDED FOR MUSCLE SPASMS. 30 tablet 0 Taking As Needed   triamterene -hydrochlorothiazide  (MAXZIDE -25) 37.5-25 MG tablet Take 1 tablet by mouth daily.  0 Taking   Vitamin D, Ergocalciferol, (DRISDOL) 1.25 MG (50000 UNIT) CAPS capsule Take 50,000 Units by mouth once a week.   Taking   oxyCODONE  (OXY IR/ROXICODONE ) 5 MG immediate release tablet Take 1-2 tablets (5-10 mg total) by mouth every 6 (six) hours as needed for moderate pain (pain score 4-6) (pain score 4-6). (Patient not taking: Reported on 12/31/2023) 30 tablet 0 Not Taking   Allergies[1]  Social History   Tobacco Use   Smoking status: Former    Current packs/day: 0.00    Types: Cigarettes    Quit date: 04/03/1999    Years since quitting: 25.0   Smokeless tobacco: Never  Substance Use Topics   Alcohol use: Not Currently    Alcohol/week: 1.0 standard drink of alcohol     Types: 1 Glasses of wine per week    Family History  Problem Relation Age of Onset   Breast cancer Mother    Diabetes Mother    Fibromyalgia Mother    Lung cancer Father    Stroke Brother    Colon cancer Neg Hx    Colon polyps Neg Hx    Esophageal cancer Neg Hx    Rectal cancer Neg Hx    Stomach cancer Neg Hx      Review of Systems  Objective:  Physical Exam Vitals reviewed.  Constitutional:      Appearance: Normal appearance. She is normal weight.  HENT:     Head: Normocephalic and atraumatic.  Eyes:     Extraocular Movements: Extraocular movements intact.     Pupils: Pupils are equal, round, and reactive to light.  Cardiovascular:     Rate and Rhythm: Normal rate and regular rhythm.  Pulmonary:  Effort: Pulmonary effort is normal.     Breath sounds: Normal breath sounds.  Abdominal:     Palpations: Abdomen is soft.  Musculoskeletal:     Cervical back: Normal range of motion and neck supple.     Left knee: Effusion, bony tenderness and crepitus present. Decreased range of motion. Tenderness present over the medial joint line and lateral joint line. Abnormal alignment.  Neurological:     Mental Status: She is alert and oriented to person, place, and time.  Psychiatric:        Behavior: Behavior normal.     Vital signs in last 24 hours:    Labs:   Estimated body mass index is 24.8 kg/m as calculated from the following:   Height as of this encounter: 5' 3 (1.6 m).   Weight as of this encounter: 63.5 kg.   Imaging Review Plain radiographs demonstrate severe degenerative joint disease of the left knee(s). The overall alignment ismild varus. The bone quality appears to be good for age and reported activity level.      Assessment/Plan:  End stage arthritis, left knee   The patient history, physical examination, clinical judgment of the provider and imaging studies are consistent with end stage degenerative joint disease of the left knee(s) and  total knee arthroplasty is deemed medically necessary. The treatment options including medical management, injection therapy arthroscopy and arthroplasty were discussed at length. The risks and benefits of total knee arthroplasty were presented and reviewed. The risks due to aseptic loosening, infection, stiffness, patella tracking problems, thromboembolic complications and other imponderables were discussed. The patient acknowledged the explanation, agreed to proceed with the plan and consent was signed. Patient is being admitted for inpatient treatment for surgery, pain control, PT, OT, prophylactic antibiotics, VTE prophylaxis, progressive ambulation and ADL's and discharge planning. The patient is planning to be discharged home with home health services        [1]  Allergies Allergen Reactions   Lisinopril Cough   Penicillins     Unknown childhood allergy   Codeine Palpitations    Feels funny, drunk

## 2024-04-13 NOTE — Anesthesia Preprocedure Evaluation (Addendum)
 "                                  Anesthesia Evaluation  Patient identified by MRN, date of birth, ID band Patient awake    Reviewed: Allergy & Precautions, H&P , NPO status , Patient's Chart, lab work & pertinent test results, reviewed documented beta blocker date and time   Airway Mallampati: II  TM Distance: >3 FB Neck ROM: Full    Dental no notable dental hx. (+) Chipped, Poor Dentition, Missing, Dental Advisory Given,    Pulmonary neg pulmonary ROS, former smoker   Pulmonary exam normal breath sounds clear to auscultation       Cardiovascular hypertension, Pt. on medications and Pt. on home beta blockers negative cardio ROS Normal cardiovascular exam Rhythm:Regular Rate:Normal     Neuro/Psych  PSYCHIATRIC DISORDERS Anxiety Depression     Neuromuscular disease negative neurological ROS  negative psych ROS   GI/Hepatic negative GI ROS, Neg liver ROS,GERD  Medicated and Controlled,,  Endo/Other  negative endocrine ROS    Renal/GU negative Renal ROSLab Results      Component                Value               Date                        K                        4.0                 03/09/2024                CREATININE               0.95                03/09/2024                  negative genitourinary   Musculoskeletal negative musculoskeletal ROS (+) Arthritis , Osteoarthritis,    Abdominal   Peds negative pediatric ROS (+)  Hematology negative hematology ROS (+) Lab Results      Component                Value               Date                      WBC                      7.6                 03/09/2024                HGB                      13.9                03/09/2024                HCT                      42.2  03/09/2024                MCV                      90.9                03/09/2024                PLT                      309                 03/09/2024              Anesthesia Other Findings All: lisinopril, Pcn,  Codeine  Reproductive/Obstetrics negative OB ROS                              Anesthesia Physical Anesthesia Plan  ASA: 2  Anesthesia Plan: Regional and Spinal   Post-op Pain Management: Regional block* and Minimal or no pain anticipated   Induction:   PONV Risk Score and Plan: 3 and Treatment may vary due to age or medical condition, Propofol  infusion and Ondansetron   Airway Management Planned: Natural Airway and Nasal Cannula  Additional Equipment: None  Intra-op Plan:   Post-operative Plan:   Informed Consent:      Dental advisory given  Plan Discussed with:   Anesthesia Plan Comments: (Spinal w L adductor canal)         Anesthesia Quick Evaluation  "

## 2024-04-14 ENCOUNTER — Observation Stay (HOSPITAL_COMMUNITY)

## 2024-04-14 ENCOUNTER — Ambulatory Visit (HOSPITAL_COMMUNITY): Payer: Self-pay | Admitting: Vascular Surgery

## 2024-04-14 ENCOUNTER — Encounter (HOSPITAL_COMMUNITY): Payer: Self-pay | Admitting: Vascular Surgery

## 2024-04-14 ENCOUNTER — Encounter (HOSPITAL_COMMUNITY): Payer: Self-pay | Admitting: Orthopaedic Surgery

## 2024-04-14 ENCOUNTER — Observation Stay (HOSPITAL_COMMUNITY)
Admission: RE | Admit: 2024-04-14 | Discharge: 2024-04-15 | Disposition: A | Discharge status: Home / Self Care | Attending: Orthopaedic Surgery | Admitting: Orthopaedic Surgery

## 2024-04-14 ENCOUNTER — Encounter (HOSPITAL_COMMUNITY): Admission: RE | Payer: Self-pay | Source: Home / Self Care

## 2024-04-14 ENCOUNTER — Other Ambulatory Visit: Payer: Self-pay

## 2024-04-14 DIAGNOSIS — Z79899 Other long term (current) drug therapy: Secondary | ICD-10-CM | POA: Insufficient documentation

## 2024-04-14 DIAGNOSIS — Z87891 Personal history of nicotine dependence: Secondary | ICD-10-CM | POA: Diagnosis not present

## 2024-04-14 DIAGNOSIS — Z01818 Encounter for other preprocedural examination: Secondary | ICD-10-CM

## 2024-04-14 DIAGNOSIS — M1712 Unilateral primary osteoarthritis, left knee: Principal | ICD-10-CM | POA: Diagnosis present

## 2024-04-14 DIAGNOSIS — I10 Essential (primary) hypertension: Secondary | ICD-10-CM | POA: Diagnosis not present

## 2024-04-14 DIAGNOSIS — M25562 Pain in left knee: Secondary | ICD-10-CM | POA: Diagnosis present

## 2024-04-14 DIAGNOSIS — Z96652 Presence of left artificial knee joint: Secondary | ICD-10-CM

## 2024-04-14 HISTORY — PX: TOTAL KNEE ARTHROPLASTY: SHX125

## 2024-04-14 LAB — SURGICAL PCR SCREEN
MRSA, PCR: NEGATIVE
Staphylococcus aureus: NEGATIVE

## 2024-04-14 LAB — CBC
HCT: 40.4 % (ref 36.0–46.0)
Hemoglobin: 13 g/dL (ref 12.0–15.0)
MCH: 29.9 pg (ref 26.0–34.0)
MCHC: 32.2 g/dL (ref 30.0–36.0)
MCV: 92.9 fL (ref 80.0–100.0)
Platelets: 291 K/uL (ref 150–400)
RBC: 4.35 MIL/uL (ref 3.87–5.11)
RDW: 14.9 % (ref 11.5–15.5)
WBC: 3.7 K/uL — ABNORMAL LOW (ref 4.0–10.5)
nRBC: 0 % (ref 0.0–0.2)

## 2024-04-14 LAB — TYPE AND SCREEN
ABO/RH(D): A POS
Antibody Screen: NEGATIVE

## 2024-04-14 MED ORDER — FENTANYL 12 MCG/HR TD PT72
1.0000 | MEDICATED_PATCH | TRANSDERMAL | Status: DC
  Administered 2024-04-14: 1 via TRANSDERMAL
  Filled 2024-04-14: qty 1

## 2024-04-14 MED ORDER — BUPIVACAINE IN DEXTROSE 0.75-8.25 % IT SOLN
INTRATHECAL | Status: DC | PRN
  Administered 2024-04-14: 1.5 mL via INTRATHECAL

## 2024-04-14 MED ORDER — PROPOFOL 500 MG/50ML IV EMUL
INTRAVENOUS | Status: DC | PRN
  Administered 2024-04-14: 50 ug/kg/min via INTRAVENOUS

## 2024-04-14 MED ORDER — ROPIVACAINE HCL 5 MG/ML IJ SOLN
INTRAMUSCULAR | Status: DC | PRN
  Administered 2024-04-14: 20 mL via PERINEURAL

## 2024-04-14 MED ORDER — LACTATED RINGERS IV SOLN: INTRAVENOUS | Status: DC

## 2024-04-14 MED ORDER — SERTRALINE HCL 50 MG PO TABS
25.0000 mg | ORAL_TABLET | Freq: Every day | ORAL | Status: DC
  Administered 2024-04-14: 25 mg via ORAL
  Filled 2024-04-14: qty 1

## 2024-04-14 MED ORDER — 0.9 % SODIUM CHLORIDE (POUR BTL) OPTIME
TOPICAL | Status: DC | PRN
  Administered 2024-04-14: 1000 mL

## 2024-04-14 MED ORDER — OXYCODONE HCL 5 MG/5ML PO SOLN: 5.0000 mg | Freq: Once | ORAL | Status: DC | PRN

## 2024-04-14 MED ORDER — POVIDONE-IODINE 10 % EX SWAB
2.0000 | Freq: Once | CUTANEOUS | Status: AC
  Administered 2024-04-14: 2 via TOPICAL

## 2024-04-14 MED ORDER — FENTANYL CITRATE (PF) 100 MCG/2ML IJ SOLN: 50.0000 ug | Freq: Once | INTRAMUSCULAR | Status: AC

## 2024-04-14 MED ORDER — MIDAZOLAM HCL 2 MG/2ML IJ SOLN
INTRAMUSCULAR | Status: AC
  Filled 2024-04-14: qty 2

## 2024-04-14 MED ORDER — PHENOL 1.4 % MT LIQD: 1.0000 | OROMUCOSAL | Status: DC | PRN

## 2024-04-14 MED ORDER — ROSUVASTATIN CALCIUM 20 MG PO TABS
20.0000 mg | ORAL_TABLET | Freq: Every day | ORAL | Status: DC
  Administered 2024-04-14: 20 mg via ORAL
  Filled 2024-04-14: qty 1

## 2024-04-14 MED ORDER — ACETAMINOPHEN 325 MG PO TABS: 325.0000 mg | ORAL_TABLET | Freq: Four times a day (QID) | ORAL | Status: DC | PRN

## 2024-04-14 MED ORDER — DOCUSATE SODIUM 100 MG PO CAPS
100.0000 mg | ORAL_CAPSULE | Freq: Two times a day (BID) | ORAL | Status: DC
  Administered 2024-04-14: 100 mg via ORAL
  Filled 2024-04-14: qty 1

## 2024-04-14 MED ORDER — PHENYLEPHRINE HCL-NACL 20-0.9 MG/250ML-% IV SOLN
INTRAVENOUS | Status: DC | PRN
  Administered 2024-04-14: 30 ug/min via INTRAVENOUS

## 2024-04-14 MED ORDER — ONDANSETRON HCL 4 MG/2ML IJ SOLN: 4.0000 mg | Freq: Once | INTRAMUSCULAR | Status: DC | PRN

## 2024-04-14 MED ORDER — OXYCODONE HCL 5 MG PO TABS: 5.0000 mg | ORAL_TABLET | Freq: Once | ORAL | Status: DC | PRN

## 2024-04-14 MED ORDER — FENTANYL CITRATE (PF) 100 MCG/2ML IJ SOLN
INTRAMUSCULAR | Status: DC | PRN
  Administered 2024-04-14 (×2): 25 ug via INTRAVENOUS

## 2024-04-14 MED ORDER — MIDAZOLAM HCL (PF) 2 MG/2ML IJ SOLN: 1.0000 mg | Freq: Once | INTRAMUSCULAR | Status: AC

## 2024-04-14 MED ORDER — MIDAZOLAM HCL 2 MG/2ML IJ SOLN
INTRAMUSCULAR | Status: AC
  Administered 2024-04-14: 1 mg via INTRAVENOUS
  Filled 2024-04-14: qty 2

## 2024-04-14 MED ORDER — FENTANYL CITRATE (PF) 100 MCG/2ML IJ SOLN
25.0000 ug | INTRAMUSCULAR | Status: DC | PRN
  Administered 2024-04-14: 50 ug via INTRAVENOUS

## 2024-04-14 MED ORDER — ORAL CARE MOUTH RINSE: 15.0000 mL | Freq: Once | OROMUCOSAL | Status: AC

## 2024-04-14 MED ORDER — LIDOCAINE 2% (20 MG/ML) 5 ML SYRINGE
INTRAMUSCULAR | Status: DC | PRN
  Administered 2024-04-14: 20 mg via INTRAVENOUS

## 2024-04-14 MED ORDER — BUPIVACAINE-EPINEPHRINE (PF) 0.25% -1:200000 IJ SOLN
INTRAMUSCULAR | Status: AC
  Filled 2024-04-14: qty 30

## 2024-04-14 MED ORDER — MEPERIDINE HCL 25 MG/ML IJ SOLN: 6.2500 mg | INTRAMUSCULAR | Status: DC | PRN

## 2024-04-14 MED ORDER — CHLORHEXIDINE GLUCONATE 0.12 % MT SOLN
15.0000 mL | Freq: Once | OROMUCOSAL | Status: AC
  Administered 2024-04-14: 15 mL via OROMUCOSAL
  Filled 2024-04-14: qty 15

## 2024-04-14 MED ORDER — GABAPENTIN 100 MG PO CAPS
100.0000 mg | ORAL_CAPSULE | Freq: Three times a day (TID) | ORAL | Status: DC
  Administered 2024-04-14: 100 mg via ORAL
  Filled 2024-04-14: qty 1

## 2024-04-14 MED ORDER — ONDANSETRON HCL 4 MG/2ML IJ SOLN
INTRAMUSCULAR | Status: DC | PRN
  Administered 2024-04-14: 4 mg via INTRAVENOUS

## 2024-04-14 MED ORDER — FENTANYL CITRATE (PF) 100 MCG/2ML IJ SOLN
INTRAMUSCULAR | Status: AC
  Filled 2024-04-14: qty 2

## 2024-04-14 MED ORDER — TIZANIDINE HCL 4 MG PO TABS
4.0000 mg | ORAL_TABLET | Freq: Four times a day (QID) | ORAL | Status: DC | PRN
  Administered 2024-04-15: 4 mg via ORAL
  Filled 2024-04-14 (×2): qty 1

## 2024-04-14 MED ORDER — ASPIRIN 81 MG PO CHEW
81.0000 mg | CHEWABLE_TABLET | Freq: Two times a day (BID) | ORAL | Status: DC
  Administered 2024-04-14: 81 mg via ORAL
  Filled 2024-04-14: qty 1

## 2024-04-14 MED ORDER — CELECOXIB 200 MG PO CAPS
200.0000 mg | ORAL_CAPSULE | Freq: Once | ORAL | Status: AC
  Administered 2024-04-14: 200 mg via ORAL
  Filled 2024-04-14: qty 1

## 2024-04-14 MED ORDER — PROPOFOL 10 MG/ML IV BOLUS
INTRAVENOUS | Status: DC | PRN
  Administered 2024-04-14 (×3): 20 mg via INTRAVENOUS

## 2024-04-14 MED ORDER — METOPROLOL TARTRATE 25 MG PO TABS
25.0000 mg | ORAL_TABLET | Freq: Two times a day (BID) | ORAL | Status: DC
  Administered 2024-04-14: 25 mg via ORAL
  Filled 2024-04-14: qty 1

## 2024-04-14 MED ORDER — TRANEXAMIC ACID-NACL 1000-0.7 MG/100ML-% IV SOLN
1000.0000 mg | INTRAVENOUS | Status: AC
  Administered 2024-04-14: 1000 mg via INTRAVENOUS
  Filled 2024-04-14: qty 100

## 2024-04-14 MED ORDER — ANASTROZOLE 1 MG PO TABS
1.0000 mg | ORAL_TABLET | Freq: Every day | ORAL | Status: DC
  Administered 2024-04-14: 1 mg via ORAL
  Filled 2024-04-14 (×2): qty 1

## 2024-04-14 MED ORDER — DEXAMETHASONE SOD PHOSPHATE PF 10 MG/ML IJ SOLN
INTRAMUSCULAR | Status: DC | PRN
  Administered 2024-04-14: 10 mg via PERINEURAL

## 2024-04-14 MED ORDER — ACETAMINOPHEN 500 MG PO TABS
1000.0000 mg | ORAL_TABLET | Freq: Once | ORAL | Status: AC
  Administered 2024-04-14: 1000 mg via ORAL
  Filled 2024-04-14: qty 2

## 2024-04-14 MED ORDER — TRIAMTERENE-HCTZ 37.5-25 MG PO TABS
1.0000 | ORAL_TABLET | Freq: Every day | ORAL | Status: DC
  Filled 2024-04-14: qty 1

## 2024-04-14 MED ORDER — SODIUM CHLORIDE 0.9 % IV SOLN: INTRAVENOUS | Status: DC

## 2024-04-14 MED ORDER — MIDAZOLAM HCL (PF) 2 MG/2ML IJ SOLN
INTRAMUSCULAR | Status: DC | PRN
  Administered 2024-04-14 (×2): .5 mg via INTRAVENOUS

## 2024-04-14 MED ORDER — FENTANYL CITRATE (PF) 100 MCG/2ML IJ SOLN
INTRAMUSCULAR | Status: AC
  Administered 2024-04-14: 50 ug via INTRAVENOUS
  Filled 2024-04-14: qty 2

## 2024-04-14 MED ORDER — BUSPIRONE HCL 15 MG PO TABS
15.0000 mg | ORAL_TABLET | Freq: Two times a day (BID) | ORAL | Status: DC
  Administered 2024-04-14: 15 mg via ORAL
  Filled 2024-04-14: qty 1

## 2024-04-14 MED ORDER — AMLODIPINE BESYLATE 10 MG PO TABS: 10.0000 mg | ORAL_TABLET | Freq: Every day | ORAL | Status: DC

## 2024-04-14 MED ORDER — GLYCOPYRROLATE 0.2 MG/ML IJ SOLN
INTRAMUSCULAR | Status: DC | PRN
  Administered 2024-04-14: .2 mg via INTRAVENOUS

## 2024-04-14 MED ORDER — BUPIVACAINE-EPINEPHRINE (PF) 0.25% -1:200000 IJ SOLN
INTRAMUSCULAR | Status: DC | PRN
  Administered 2024-04-14: 30 mL

## 2024-04-14 MED ORDER — CEFAZOLIN SODIUM-DEXTROSE 2-4 GM/100ML-% IV SOLN
2.0000 g | INTRAVENOUS | Status: AC
  Administered 2024-04-14: 2 g via INTRAVENOUS
  Filled 2024-04-14: qty 100

## 2024-04-14 MED ORDER — DIPHENHYDRAMINE HCL 12.5 MG/5ML PO ELIX: 12.5000 mg | ORAL_SOLUTION | ORAL | Status: DC | PRN

## 2024-04-14 MED ORDER — EPHEDRINE SULFATE-NACL 50-0.9 MG/10ML-% IV SOSY
PREFILLED_SYRINGE | INTRAVENOUS | Status: DC | PRN
  Administered 2024-04-14 (×2): 5 mg via INTRAVENOUS

## 2024-04-14 MED ORDER — MENTHOL 3 MG MT LOZG: 1.0000 | LOZENGE | OROMUCOSAL | Status: DC | PRN

## 2024-04-14 MED ORDER — PANTOPRAZOLE SODIUM 40 MG PO TBEC
40.0000 mg | DELAYED_RELEASE_TABLET | Freq: Every day | ORAL | Status: DC
  Administered 2024-04-14: 40 mg via ORAL
  Filled 2024-04-14: qty 1

## 2024-04-14 MED ORDER — ALUM & MAG HYDROXIDE-SIMETH 200-200-20 MG/5ML PO SUSP: 30.0000 mL | ORAL | Status: DC | PRN

## 2024-04-14 MED ORDER — SODIUM CHLORIDE 0.9 % IR SOLN
Status: DC | PRN
  Administered 2024-04-14: 1

## 2024-04-14 MED ORDER — PROPOFOL 10 MG/ML IV BOLUS
INTRAVENOUS | Status: AC
  Filled 2024-04-14: qty 20

## 2024-04-14 NOTE — Anesthesia Procedure Notes (Signed)
 Spinal  Patient location during procedure: OR Start time: 04/14/2024 11:55 AM End time: 04/14/2024 12:00 PM Reason for block: surgical anesthesia  Staffing Authorized by: Mallory Manus, MD   Performed by: Mallory Manus, MD  Preanesthetic Checklist Completed: patient identified, IV checked, site marked, risks and benefits discussed, surgical consent, monitors and equipment checked, pre-op evaluation and timeout performed Spinal Block Patient position: sitting Prep: DuraPrep Patient monitoring: heart rate, cardiac monitor, continuous pulse ox and blood pressure Approach: midline Location: L2-3 Injection technique: single-shot Needle Needle type: Sprotte  Needle gauge: 24 G Needle length: 9 cm Assessment Sensory level: T4 Events: CSF return  Additional Notes Multiple attepts at L5 with osso on every pass. Change to L2/3 on second pass +CSF/ DOSE / +CSF

## 2024-04-14 NOTE — Transfer of Care (Signed)
 Immediate Anesthesia Transfer of Care Note  Patient: Morgan Swanson  Procedure(s) Performed: ARTHROPLASTY, KNEE, TOTAL (Left: Knee)  Patient Location: PACU  Anesthesia Type:MAC and Spinal  Level of Consciousness: awake, alert , and oriented  Airway & Oxygen Therapy: Patient Spontanous Breathing  Post-op Assessment: Report given to RN and Post -op Vital signs reviewed and stable  Post vital signs: Reviewed and stable  Last Vitals:  Vitals Value Taken Time  BP 106/59 04/14/24 14:02  Temp    Pulse 56 04/14/24 14:04  Resp 13 04/14/24 14:04  SpO2 95 % 04/14/24 14:04  Vitals shown include unfiled device data.  Last Pain:  Vitals:   04/14/24 0950  TempSrc:   PainSc: 0-No pain      Patients Stated Pain Goal: 0 (04/14/24 0950)  Complications: No notable events documented.

## 2024-04-14 NOTE — Anesthesia Procedure Notes (Signed)
 Anesthesia Regional Block: Adductor canal block   Pre-Anesthetic Checklist: , timeout performed,  Correct Patient, Correct Site, Correct Laterality,  Correct Procedure, Correct Position, site marked,  Risks and benefits discussed,  Surgical consent,  Pre-op evaluation,  At surgeon's request and post-op pain management  Laterality: Left  Prep: chloraprep       Needles:  Injection technique: Single-shot  Needle Type: Echogenic Stimulator Needle     Needle Length: 5cm  Needle Gauge: 22     Additional Needles:   Procedures:,,,, ultrasound used (permanent image in chart),,    Narrative:  Start time: 04/14/2024 9:36 AM End time: 04/14/2024 9:40 AM Injection made incrementally with aspirations every 5 mL.  Performed by: Personally  Anesthesiologist: Mallory Manus, MD  Additional Notes: Functioning IV was confirmed and monitors were applied.  A 50mm 22ga Arrow echogenic stimulator needle was used. Sterile prep and drape,hand hygiene and sterile gloves were used. Ultrasound guidance: relevant anatomy identified, needle position confirmed, local anesthetic spread visualized around nerve(s)., vascular puncture avoided.  Image printed for medical record. Negative aspiration and negative test dose prior to incremental administration of local anesthetic. The patient tolerated the procedure well.

## 2024-04-14 NOTE — Anesthesia Postprocedure Evaluation (Signed)
"   Anesthesia Post Note  Patient: Natoshia C Aylesworth  Procedure(s) Performed: ARTHROPLASTY, KNEE, TOTAL (Left: Knee)     Patient location during evaluation: PACU Anesthesia Type: Regional, Spinal and MAC Level of consciousness: oriented and awake and alert Pain management: pain level controlled Vital Signs Assessment: post-procedure vital signs reviewed and stable Respiratory status: spontaneous breathing, respiratory function stable and patient connected to nasal cannula oxygen Cardiovascular status: blood pressure returned to baseline and stable Postop Assessment: no headache, no backache and no apparent nausea or vomiting Anesthetic complications: no   No notable events documented.  Last Vitals:  Vitals:   04/14/24 1530 04/14/24 1545  BP: (!) 114/55 (!) 121/55  Pulse: (!) 49 (!) 56  Resp: 12 15  Temp:    SpO2: 97% 97%    Last Pain:  Vitals:   04/14/24 1545  TempSrc:   PainSc: 3                  Nika Yazzie      "

## 2024-04-14 NOTE — Interval H&P Note (Signed)
 History and Physical Interval Note: The patient understands that she is here today for a left total knee replacement to treat her significant left knee pain and arthritis.  There has been no acute or interval change in her medical status.  The risks and benefits of surgery have been discussed in detail and informed consent has been obtained.  The left operative knee has been marked.  04/14/2024 10:27 AM  Morgan Swanson  has presented today for surgery, with the diagnosis of Osteoarthritis Left Knee.  The various methods of treatment have been discussed with the patient and family. After consideration of risks, benefits and other options for treatment, the patient has consented to  Procedures: ARTHROPLASTY, KNEE, TOTAL (Left) as a surgical intervention.  The patient's history has been reviewed, patient examined, no change in status, stable for surgery.  I have reviewed the patient's chart and labs.  Questions were answered to the patient's satisfaction.     [name]

## 2024-04-14 NOTE — Op Note (Signed)
 "    Operative Note  Date of operation: 04/14/2024 Preoperative diagnosis: Left knee primary osteoarthritis Postoperative diagnosis: Same  Procedure: Left cemented total knee arthroplasty  Implants: Biomet/Zimmer persona cemented knee system (CR medial congruent) Implant Name Type Inv. Item Serial No. Manufacturer Lot No. LRB No. Used Action  CEMENT BONE R 1X40 - [id number] Cement CEMENT BONE R 1X40  ZIMMER RECON(ORTH,TRAU,BIO,SG) [id number] Left 2 Implanted  INSERT ASF POLY 14 LT - [id number] Insert INSERT ASF POLY 14 LT  ZIMMER [employer]ON(ORTH,TRAU,BIO,SG) [id number] Left 1 Implanted  COMPONENT FEM CEMT PRNSA [id number] - [id number] Knees COMPONENT FEM CEMT PRNSA [id number]  [employer]MER [employer]ON(ORTH,TRAU,BIO,SG) [id number] Left 1 Implanted  COMPONET TIB PS KNEE E 0D LT - [id number] Joint COMPONET TIB PS KNEE E 0D LT  ZIMMER [employer]ON(ORTH,TRAU,BIO,SG) [id number] Left 1 Implanted  STEM POLY PAT PLY 89M KNEE - [id number] Knees STEM POLY PAT PLY 89M KNEE  ZIMMER RECON(ORTH,TRAU,BIO,SG) [id number] Left 1 Implanted   Surgeon: Lonni GRADE. Vernetta, MD Assistant: Tory Gaskins, PA-C  Anesthesia: #1 left lower extremity adductor canal block, #2 spinal, #3 local Tourniquet time: Under 1 hour EBL: Less than 50 cc Antibiotics: IV Ancef  Complications: None  Indications: The patient is a 74 year old female with debilitating arthritis involving her left knee.  She has tried and failed all forms of conservative treatment and at this point her left knee pain is daily and it is definitely affecting her mobility, quality of life and actives daily living to the point she does wish to proceed with a total knee arthroplasty on the left side.  We have replaced her right knee and that is doing well.  That was done last year.  Having had a knee replacement for the right side she is fully aware of the risks and benefits of surgery and does wish to proceed on her left knee replacement.  Procedure description: After informed consent was obtained and  appropriate left knee was marked, anesthesia obtained a left lower extremity adductor canal block in the holding room.  The patient was then brought to the operating room and sat up on the operative table spinal anesthesia was obtained.  She was then laid in spine position on the operating table and a Foley catheter was placed.  A nonsterile tourniquet placed from the upper left thigh and the left thigh, knee, leg, ankle and foot were prepped and draped with DuraPrep and sterile drapes including a sterile stockinette.  A timeout was called and she was notified of the correct patient and the correct left knee.  An Esmarch was then used to wrap of the leg and the tourniquet was plated to 300 mm of pressure.  With the knee extended a direct midline incision was made over the patella and carried proximally distally.  Dissection was carried down to the knee joint and a medial parapatellar arthrotomy is made finding a moderate joint effusion.  With the knee in a flexed position without significant cartilage wear in all 3 compartments of the knee.  Osteophytes removed from around the knee as well as the ACL and medial and lateral meniscus.  We then used the extramedullary base cutting guide for making her proximal tibia cut correcting her varus and valgus and a 7 degree slope.  Made this cut to take 2 mm off the low side.  We then used the intramedullary cutting guide for distal femur cut setting this for a left knee at 5 degrees x-ray is rotated and a 10 mm distal femoral cut.  Made this Without  difficulty and brought the knee back down to full extension and she actually slightly hyperextended with a 10 mm block.  We go back to the femur and put a femoral sizing guide based on the epicondylar axis and Whitesides line.  Based off of this we chose a size 9 femur.  We put a 4-in-1 cutting block for a size 9 femur and made our anterior and posterior cuts of our chamfer cuts.  We then backed the tibia and chose a size E left  tibial tray for coverage over the tibial plateau setting this for a left knee and setting the rotation based off of the tibial tubercle in the femur.  We did our drill hole and keel punch off of this.  We then trialed a size E left tibia combined with our size 9 left CR standard femur.  We trialed up to a 14 mm thickness left medial congruent path and insert and replaced with range of motion and stability without 14 mm insert.  We then made a patella cut and drilled drilled for a size 32 patella button.  We then removed all trial instruction on the knee and irrigated knee with normal saline solution using pulsatile lavage.  We then placed Marcaine  with epinephrine  around the arthrotomy.  Next we mixed our cement and dried the knee real well.  With the knee in a flexed position we then cemented our Biomet/Zimmer persona tibial tray for left knee size E followed by cementing our size 9 left CR standard femur.  We placed a 14 mm thickness left medial congruent poly insert and cemented our size 32 patella button.  We then held the knee fully extended and compressed well the cemented hardened and excess cement debris was removed from the knee.  Once the cement hardened the tourniquet was let down and hemostasis was obtained with electrocautery.  The arthrotomy was then closed with interrupted #1 Vicryl suture followed by 0 Vicryl to close the deep tissue and 2-0 Vicryl to close subcutaneous tissue.  The skin was closed with staples.  A well-padded sterile dressing was applied.  The patient was taken recovery.  Clark PA-C did assist during the entire case from beginning to end and his assistance was crucial and medically necessary for soft tissue management and retraction, helping guiding about placement in a layered closure of the wound. "

## 2024-04-15 ENCOUNTER — Encounter (HOSPITAL_COMMUNITY): Payer: Self-pay | Admitting: Orthopaedic Surgery

## 2024-04-15 DIAGNOSIS — M1712 Unilateral primary osteoarthritis, left knee: Secondary | ICD-10-CM | POA: Diagnosis not present

## 2024-04-15 LAB — BASIC METABOLIC PANEL WITH GFR
Anion gap: 10 (ref 5–15)
BUN: 18 mg/dL (ref 8–23)
CO2: 28 mmol/L (ref 22–32)
Calcium: 9.4 mg/dL (ref 8.9–10.3)
Chloride: 99 mmol/L (ref 98–111)
Creatinine, Ser: 0.85 mg/dL (ref 0.44–1.00)
GFR, Estimated: >60 mL/min
Glucose, Bld: 132 mg/dL — ABNORMAL HIGH (ref 70–99)
Potassium: 4.4 mmol/L (ref 3.5–5.1)
Sodium: 137 mmol/L (ref 135–145)

## 2024-04-15 LAB — CBC
HCT: 30.8 % — ABNORMAL LOW (ref 36.0–46.0)
Hemoglobin: 10 g/dL — ABNORMAL LOW (ref 12.0–15.0)
MCH: 29.7 pg (ref 26.0–34.0)
MCHC: 32.5 g/dL (ref 30.0–36.0)
MCV: 91.4 fL (ref 80.0–100.0)
Platelets: 283 K/uL (ref 150–400)
RBC: 3.37 MIL/uL — ABNORMAL LOW (ref 3.87–5.11)
RDW: 14.8 % (ref 11.5–15.5)
WBC: 9.2 K/uL (ref 4.0–10.5)
nRBC: 0 % (ref 0.0–0.2)

## 2024-04-15 MED ORDER — CEFAZOLIN SODIUM-DEXTROSE 2-4 GM/100ML-% IV SOLN
2.0000 g | Freq: Four times a day (QID) | INTRAVENOUS | Status: AC
  Administered 2024-04-15 (×2): 2 g via INTRAVENOUS
  Filled 2024-04-15 (×2): qty 100

## 2024-04-15 MED ORDER — OXYCODONE HCL 5 MG PO TABS: 5.0000 mg | ORAL_TABLET | Freq: Four times a day (QID) | ORAL | 0 refills | Status: DC | PRN

## 2024-04-15 MED ORDER — METOCLOPRAMIDE HCL 5 MG PO TABS: 5.0000 mg | ORAL_TABLET | Freq: Three times a day (TID) | ORAL | Status: DC | PRN

## 2024-04-15 MED ORDER — METOCLOPRAMIDE HCL 5 MG/ML IJ SOLN: 5.0000 mg | Freq: Three times a day (TID) | INTRAMUSCULAR | Status: DC | PRN

## 2024-04-15 MED ORDER — OXYCODONE HCL 5 MG PO TABS
5.0000 mg | ORAL_TABLET | ORAL | Status: DC | PRN
  Administered 2024-04-15 (×2): 10 mg via ORAL
  Filled 2024-04-15 (×2): qty 2

## 2024-04-15 MED ORDER — ONDANSETRON HCL 4 MG PO TABS: 4.0000 mg | ORAL_TABLET | Freq: Four times a day (QID) | ORAL | Status: DC | PRN

## 2024-04-15 MED ORDER — HYDROMORPHONE HCL 2 MG PO TABS: 2.0000 mg | ORAL_TABLET | ORAL | Status: DC | PRN

## 2024-04-15 MED ORDER — HYDROMORPHONE HCL 1 MG/ML IJ SOLN: 0.5000 mg | INTRAMUSCULAR | Status: DC | PRN

## 2024-04-15 MED ORDER — ONDANSETRON HCL 4 MG/2ML IJ SOLN: 4.0000 mg | Freq: Four times a day (QID) | INTRAMUSCULAR | Status: DC | PRN

## 2024-04-15 MED ORDER — ASPIRIN 81 MG PO CHEW: 81.0000 mg | CHEWABLE_TABLET | Freq: Two times a day (BID) | ORAL | 0 refills | Status: AC

## 2024-04-15 NOTE — Evaluation (Signed)
 Physical Therapy Evaluation  Patient Details Name: Morgan Swanson MRN: [id number] DOB: 04-13-1950 Today's Date: 04/15/2024  History of Present Illness  Pt is a 74 y/o female who presents s/p L TKA on 04/14/2024. PMH significant for Breast CA, back pain, HTN, R TKA 2025.  Clinical Impression  Pt admitted with above diagnosis. Pt currently with functional limitations due to the deficits listed below (see PT Problem List). At the time of PT eval pt was able to perform transfers and ambulation with gross CGA to supervision for safety and RW for support. Pt will benefit from acute skilled PT to increase their independence and safety with mobility to allow discharge.           If plan is discharge home, recommend the following: A little help with walking and/or transfers;A little help with bathing/dressing/bathroom;Two people to help with bathing/dressing/bathroom;Help with stairs or ramp for entrance;Assist for transportation   Can travel by private vehicle        Equipment Recommendations None recommended by PT  Recommendations for Other Services       Functional Status Assessment Patient has had a recent decline in their functional status and demonstrates the ability to make significant improvements in function in a reasonable and predictable amount of time.     Precautions / Restrictions Precautions Precautions: Fall;Knee Recall of Precautions/Restrictions: Impaired Precaution/Restrictions Comments: Requires repetition of cues. Pt was educated that no pillow/roll/ice pack should be placed under the knee and that knee should be resting in extension. Upon entry, pt was sitting up in the chair with feet on the floor. Restrictions Weight Bearing Restrictions Per Provider Order: Yes LLE Weight Bearing Per Provider Order: Weight bearing as tolerated      Mobility  Bed Mobility               General bed mobility comments: Pt was received sitting up in the recliner.     Transfers Overall transfer level: Needs assistance Equipment used: Rolling walker (2 wheels) Transfers: Sit to/from Stand Sit to Stand: Supervision           General transfer comment: VC's for hand placement on seated surface for safety.    Ambulation/Gait Ambulation/Gait assistance: Contact guard assist Gait Distance (Feet): 300 Feet Assistive device: Rolling walker (2 wheels) Gait Pattern/deviations: Step-through pattern, Decreased stride length, Trunk flexed Gait velocity: Decreased Gait velocity interpretation: <1.31 ft/sec, indicative of household ambulator   General Gait Details: VC's for improved posture, closer walker proximity and increased heel strike.  Stairs Stairs: Yes Stairs assistance: Contact guard assist Stair Management: One rail Right, Step to pattern, Forwards Number of Stairs: 2 General stair comments: VC's for sequencing and general safety. Pt touching lightly to rail to simualte home environment where she can touch to a wall for support.  Wheelchair Mobility     Tilt Bed    Modified Rung (Stroke Patients Only)       Balance Overall balance assessment: Needs assistance Sitting-balance support: Feet supported, No upper extremity supported Sitting balance-Leahy Scale: Fair                                       Pertinent Vitals/Pain Pain Assessment Pain Assessment: Faces Faces Pain Scale: Hurts even more Pain Location: L knee Pain Descriptors / Indicators: Operative site guarding, Sore Pain Intervention(s): Limited activity within patient's tolerance, Monitored during session, Repositioned    Home Living Family/patient expects  to be discharged to:: Private residence Living Arrangements: Alone Available Help at Discharge: Family;Friend(s);Available PRN/intermittently Type of Home: House Home Access: Stairs to enter Entrance Stairs-Rails: None Entrance Stairs-Number of Steps: 2   Home Layout: One level Home  Equipment: Agricultural Consultant (2 wheels);BSC/3in1;Shower seat      Prior Function Prior Level of Function : Independent/Modified Independent             Mobility Comments: Health and safety inspector       Extremity/Trunk Assessment   Upper Extremity Assessment Upper Extremity Assessment: Overall WFL for tasks assessed    Lower Extremity Assessment Lower Extremity Assessment: LLE deficits/detail LLE Deficits / Details: Decreased strength and AROM consistent with above mentioned surgery. LLE: Unable to fully assess due to pain    Cervical / Trunk Assessment Cervical / Trunk Assessment: Normal  Communication   Communication Communication: No apparent difficulties    Cognition Arousal: Alert Behavior During Therapy: WFL for tasks assessed/performed   PT - Cognitive impairments: No apparent impairments                         Following commands: Intact       Cueing Cueing Techniques: Verbal cues, Gestural cues     General Comments      Exercises Total Joint Exercises Ankle Circles/Pumps: 10 reps, AROM, Both Quad Sets: 10 reps, AROM, Left Short Arc Quad: 5 reps, AROM, Left Heel Slides: 5 reps, Left, AROM Hip ABduction/ADduction: 5 reps, AROM, Left Long Arc Quad: 10 reps, AROM, Left Knee Flexion: 10 reps, AROM, Seated, Left Goniometric ROM: 85 AROM in sitting L knee   Assessment/Plan    PT Assessment Patient needs continued PT services  PT Problem List Decreased strength;Decreased range of motion;Decreased activity tolerance;Decreased balance;Decreased mobility;Decreased knowledge of use of DME;Decreased safety awareness;Decreased knowledge of precautions;Pain       PT Treatment Interventions DME instruction;Gait training;Functional mobility training;Stair training;Therapeutic activities;Therapeutic exercise;Balance training;Patient/family education    PT Goals (Current goals can be found in the Care Plan section)  Acute Rehab PT  Goals Patient Stated Goal: Home today PT Goal Formulation: With patient Time For Goal Achievement: 04/22/24 Potential to Achieve Goals: Good    Frequency 7X/week     Co-evaluation               AM-PAC PT 6 Clicks Mobility  Outcome Measure Help needed turning from your back to your side while in a flat bed without using bedrails?: None Help needed moving from lying on your back to sitting on the side of a flat bed without using bedrails?: None Help needed moving to and from a bed to a chair (including a wheelchair)?: A Little Help needed standing up from a chair using your arms (e.g., wheelchair or bedside chair)?: A Little Help needed to walk in hospital room?: A Little Help needed climbing 3-5 steps with a railing? : A Little 6 Click Score: 20    End of Session Equipment Utilized During Treatment: Gait belt Activity Tolerance: Patient tolerated treatment well Patient left: in chair;with call bell/phone within reach Nurse Communication: Mobility status PT Visit Diagnosis: Unsteadiness on feet (R26.81);Pain Pain - Right/Left: Left Pain - part of body: Knee    Time: 0935-1004 PT Time Calculation (min) (ACUTE ONLY): 29 min   Charges:   PT Evaluation $PT Eval Low Complexity: 1 Low PT Treatments $Gait Training: 8-22 mins PT General Charges $$ ACUTE PT VISIT: 1 Visit  Leita Sable, PT, DPT Acute Rehabilitation Services Secure Chat Preferred Office: [phone]   Leita JONETTA Sable 04/15/2024, 1:50 PM

## 2024-04-15 NOTE — Discharge Summary (Signed)
 Patient ID: Morgan Swanson MRN: [id number] DOB/AGE: Jul 27, 1950 74 y.o.  Admit date: 04/14/2024 Discharge date: 04/15/2024  Admission Diagnoses:  Principal Problem:   Unilateral primary osteoarthritis, left knee Active Problems:   Status post total left knee replacement   Discharge Diagnoses:  Same  Past Medical History:  Diagnosis Date   Anxiety    Arthritis    back    Back pain    Cancer (HCC)    breast cancer   Cataract    small immature   Depression    GERD (gastroesophageal reflux disease)    Hyperlipidemia    Hypertension    Spinal stenosis, lumbar region, without neurogenic claudication     Surgeries: Procedures: ARTHROPLASTY, KNEE, TOTAL on 04/14/2024   Consultants:   Discharged Condition: Improved  Hospital Course: KYNDEL EGGER is an 74 y.o. female who was admitted 04/14/2024 for operative treatment ofUnilateral primary osteoarthritis, left knee. Patient has severe unremitting pain that affects sleep, daily activities, and work/hobbies. After pre-op clearance the patient was taken to the operating room on 04/14/2024 and underwent  Procedures: ARTHROPLASTY, KNEE, TOTAL.    Patient was given perioperative antibiotics:  Anti-infectives (From admission, onward)    Start     Dose/Rate Route Frequency Ordered Stop   04/15/24 0102  ceFAZolin  (ANCEF ) IVPB 2g/100 mL premix        2 g 200 mL/hr over 30 Minutes Intravenous Every 6 hours 04/15/24 0102 04/15/24 0738   04/14/24 0845  ceFAZolin  (ANCEF ) IVPB 2g/100 mL premix        2 g 200 mL/hr over 30 Minutes Intravenous On call to O.R. 04/14/24 0836 04/14/24 1220        Patient was given sequential compression devices, early ambulation, and chemoprophylaxis to prevent DVT.  Inpatient Morphine Milligram Equivalents Per Day 1/13 - 1/14   Values displayed are in units of MME/Day    Order Start / End Date Yesterday Today    oxyCODONE  (Oxy IR/ROXICODONE ) immediate release tablet 5 mg 1/13 - 1/13 0 of Unknown --     oxyCODONE  (ROXICODONE ) 5 MG/5ML solution 5 mg 1/13 - 1/13 0 of Unknown --      Group total: 0 of Unknown     fentaNYL  (SUBLIMAZE ) injection 25-50 mcg 1/13 - 1/13 15 of 45-90 --    meperidine  (DEMEROL ) injection 6.25-12.5 mg 1/13 - 1/13 0 of 7.5-15 --    fentaNYL  (SUBLIMAZE ) 100 MCG/2ML injection 1/13 - 1/13 0 of Unknown --    fentaNYL  (SUBLIMAZE ) injection 50 mcg 1/13 - 1/13 15 of 15 --    fentaNYL  (SUBLIMAZE ) injection 1/13 - 1/13 *15 of 15 --    fentaNYL  (DURAGESIC ) 12 MCG/HR 1 patch 1/13 - No end date 4.36 of 4.5 16.52 of 28.8    HYDROmorphone  (DILAUDID ) injection 0.5-1 mg 1/14 - No end date -- 0 of 60-120    HYDROmorphone  (DILAUDID ) tablet 2-3 mg 1/14 - No end date -- 0 of 48-72    oxyCODONE  (Oxy IR/ROXICODONE ) immediate release tablet 5-10 mg 1/14 - No end date -- 30 of 45-90    Daily Totals  * 49.36 of Unknown (at least 87-139.5) 46.52 of 181.8-310.8  *One-Step medication  Calculation Errors     Order Type Date Details   oxyCODONE  (Oxy IR/ROXICODONE ) immediate release tablet 5 mg Ordered Dose -- Insufficient frequency information   oxyCODONE  (ROXICODONE ) 5 MG/5ML solution 5 mg Ordered Dose -- Insufficient frequency information   fentaNYL  (SUBLIMAZE ) 100 MCG/2ML injection Ordered Dose -- Frequency type could  not be determined            Patient benefited maximally from hospital stay and there were no complications.    Recent vital signs: Patient Vitals for the past 24 hrs:  BP Temp Temp src Pulse Resp SpO2  04/15/24 0742 (!) 101/54 97.9 F (36.6 C) Oral (!) 51 16 100 %  04/15/24 0354 127/67 98 F (36.7 C) Oral (!) 52 18 100 %  04/14/24 2335 112/86 97.7 F (36.5 C) Oral 65 18 98 %  04/14/24 1859 (!) 120/56 98.9 F (37.2 C) Oral 61 16 99 %  04/14/24 1830 125/65 98.2 F (36.8 C) -- 64 12 98 %  04/14/24 1815 124/70 -- -- 62 11 97 %  04/14/24 1745 (!) 110/50 -- -- 63 12 98 %  04/14/24 1730 119/64 -- -- 60 12 97 %  04/14/24 1715 115/62 -- -- (!) 57 14 96 %  04/14/24  1700 114/78 -- -- (!) 56 11 96 %  04/14/24 1645 120/61 -- -- 61 16 95 %  04/14/24 1630 (!) 112/56 -- -- (!) 56 11 97 %  04/14/24 1615 116/62 -- -- (!) 56 11 96 %  04/14/24 1600 (!) 118/59 -- -- (!) 54 14 98 %  04/14/24 1545 (!) 121/55 -- -- (!) 56 15 97 %  04/14/24 1530 (!) 114/55 -- -- (!) 49 12 97 %  04/14/24 1515 (!) 119/58 -- -- (!) 48 12 96 %  04/14/24 1500 116/60 -- -- (!) 56 11 96 %  04/14/24 1430 (!) 109/54 -- -- (!) 50 12 94 %  04/14/24 1415 (!) 104/59 -- -- (!) 49 12 92 %  04/14/24 1402 (!) 106/59 97.6 F (36.4 C) -- 65 16 93 %     Recent laboratory studies:  Recent Labs    04/14/24 0921 04/15/24 0648  WBC 3.7* 9.2  HGB 13.0 10.0*  HCT 40.4 30.8*  PLT 291 283  NA  --  137  K  --  4.4  CL  --  99  CO2  --  28  BUN  --  18  CREATININE  --  0.85  GLUCOSE  --  132*  CALCIUM   --  9.4     Discharge Medications:   Allergies as of 04/15/2024       Reactions   Lisinopril Cough   Penicillins    Unknown childhood allergy   Codeine Palpitations   Feels funny, drunk        Medication List     STOP taking these medications    aspirin  EC 81 MG tablet Replaced by: aspirin  81 MG chewable tablet       TAKE these medications    amLODipine  10 MG tablet Commonly known as: NORVASC  Take 10 mg by mouth daily.   anastrozole  1 MG tablet Commonly known as: ARIMIDEX  Take 1 tablet (1 mg total) by mouth daily. What changed: when to take this   aspirin  81 MG chewable tablet Chew 1 tablet (81 mg total) by mouth 2 (two) times daily. Replaces: aspirin  EC 81 MG tablet   baclofen  10 MG tablet Commonly known as: LIORESAL  Take 1 tablet (10 mg total) by mouth at bedtime as needed for muscle spasms.   BC FAST PAIN RELIEF PO Take 1 packet by mouth daily as needed (pain).   benzonatate 100 MG capsule Commonly known as: TESSALON Take 100 mg by mouth 3 (three) times daily as needed for cough.   busPIRone  15 MG tablet Commonly  known as: BUSPAR  Take 15 mg by mouth 2  (two) times daily.   Crestor  20 MG tablet Generic drug: rosuvastatin  Take 20 mg by mouth daily.   fentaNYL  12 MCG/HR Commonly known as: DURAGESIC  Place 1 patch onto the skin every 3 (three) days. Patient has drug agreement,   gabapentin  100 MG capsule Commonly known as: NEURONTIN  Take 1 capsule (100 mg total) by mouth 3 (three) times daily.   metoprolol  tartrate 25 MG tablet Commonly known as: LOPRESSOR  TAKE 1 TABLET(25 MG) BY MOUTH TWICE DAILY   omeprazole 40 MG capsule Commonly known as: PRILOSEC Take 40 mg by mouth daily before breakfast.   oxyCODONE  5 MG immediate release tablet Commonly known as: Oxy IR/ROXICODONE  Take 1-2 tablets (5-10 mg total) by mouth every 6 (six) hours as needed for moderate pain (pain score 4-6) (pain score 4-6). What changed: Another medication with the same name was added. Make sure you understand how and when to take each.   oxyCODONE  5 MG immediate release tablet Commonly known as: Oxy IR/ROXICODONE  Take 1-2 tablets (5-10 mg total) by mouth every 6 (six) hours as needed for moderate pain (pain score 4-6) (pain score 4-6). What changed: You were already taking a medication with the same name, and this prescription was added. Make sure you understand how and when to take each.   sertraline  25 MG tablet Commonly known as: ZOLOFT  Take 25 mg by mouth daily.   tiZANidine  4 MG tablet Commonly known as: ZANAFLEX  TAKE 1 TABLET BY MOUTH EVERY 6 HOURS AS NEEDED FOR MUSCLE SPASMS.   triamterene -hydrochlorothiazide  37.5-25 MG tablet Commonly known as: MAXZIDE -25 Take 1 tablet by mouth daily.   Vitamin D (Ergocalciferol) 1.25 MG (50000 UNIT) Caps capsule Commonly known as: DRISDOL Take 50,000 Units by mouth once a week.        Diagnostic Studies: DG Knee Left Port Result Date: 04/14/2024 CLINICAL DATA:  Status post left knee arthroplasty. EXAM: PORTABLE LEFT KNEE - 1-2 VIEW COMPARISON:  None Available. FINDINGS: There is a total left knee  arthroplasty. The arthroplasty components appear intact and in anatomic alignment. There is no acute fracture or dislocation. There is a small joint effusion and air. Anterior knee cutaneous staples. IMPRESSION: Status post total left knee arthroplasty. No immediate complication. Electronically Signed   By: Vanetta Chou M.D.   On: 04/14/2024 16:50    Disposition: Discharge disposition: 01-Home or Self Care          Follow-up Information     Vernetta Lonni GRADE, MD Follow up in 2 week(s).   Specialty: Orthopedic Surgery Contact information: [address] [place] [address] [phone]         Earvin Johnston PARAS, FNP Follow up on 04/22/2024.   Specialty: Family Medicine Why: 2:30 pm Contact information: [address] [place] [address] [phone]         [facility] Follow up.   Specialty: Home Health Services Why: [facility] will contact you for the first home visit Contact information: [address] [place] [address] [phone]                  Signed: Lonni GRADE Vernetta 04/15/2024, 1:45 PM

## 2024-04-15 NOTE — Progress Notes (Signed)
 Patient alert and oriented, mae's well, voiding adequate amount of urine, swallowing without difficulty, no c/o pain at time of discharge. Patient discharged home with family. Script and discharged instructions given to patient. Patient  stated understanding of instructions given. Room was checked and accounted for all patient's belongings; discharge instructions concerning her medications, incision care, follow up appointment and when to call the doctor as needed were all discussed with patient by RN and she expressed understanding on the instructions given.

## 2024-04-15 NOTE — Progress Notes (Signed)
 Subjective: 1 Day Post-Op Procedures (LRB): ARTHROPLASTY, KNEE, TOTAL (Left) Patient reports pain as moderate.    Objective: Vital signs in last 24 hours: Temp:  [97.6 F (36.4 C)-98.9 F (37.2 C)] 98 F (36.7 C) (01/14 0354) Pulse Rate:  [41-65] 52 (01/14 0354) Resp:  [10-18] 18 (01/14 0354) BP: (103-133)/(50-89) 127/67 (01/14 0354) SpO2:  [92 %-100 %] 100 % (01/14 0354) Weight:  [63.5 kg] 63.5 kg (01/13 0847)  Intake/Output from previous day: 01/13 0701 - 01/14 0700 In: 700 [I.V.:600; IV Piggyback:100] Out: 675 [Urine:650; Blood:25] Intake/Output this shift: No intake/output data recorded.  Recent Labs    04/14/24 0921  HGB 13.0   Recent Labs    04/14/24 0921  WBC 3.7*  RBC 4.35  HCT 40.4  PLT 291   No results for input(s): NA, K, CL, CO2, BUN, CREATININE, GLUCOSE, CALCIUM  in the last 72 hours. No results for input(s): LABPT, INR in the last 72 hours.  Sensation intact distally Intact pulses distally Dorsiflexion/Plantar flexion intact Incision: dressing C/D/I Compartment soft   Assessment/Plan: 1 Day Post-Op Procedures (LRB): ARTHROPLASTY, KNEE, TOTAL (Left) Up with therapy Discharge home with home health      Morgan Swanson 04/15/2024, 7:11 AM

## 2024-04-15 NOTE — Care Management Obs Status (Signed)
 MEDICARE OBSERVATION STATUS NOTIFICATION   Patient Details  Name: Morgan Swanson MRN: [id number] Date of Birth: Sep 01, 1950   Medicare Observation Status Notification Given:  Yes    Jennie Laneta Dragon 04/15/2024, 9:35 AM

## 2024-04-15 NOTE — Discharge Instructions (Signed)

## 2024-04-15 NOTE — Plan of Care (Signed)
" °  Problem: Education: Goal: Knowledge of the prescribed therapeutic regimen will improve Outcome: Completed/Met   Problem: Bowel/Gastric: Goal: Gastrointestinal status for postoperative course will improve Outcome: Completed/Met   Problem: Cardiac: Goal: Ability to maintain an adequate cardiac output Outcome: Completed/Met Goal: Will show no evidence of cardiac arrhythmias Outcome: Completed/Met   Problem: Nutritional: Goal: Will attain and maintain optimal nutritional status Outcome: Completed/Met   Problem: Neurological: Goal: Will regain or maintain usual level of consciousness Outcome: Completed/Met   Problem: Clinical Measurements: Goal: Ability to maintain clinical measurements within normal limits Outcome: Completed/Met Goal: Postoperative complications will be avoided or minimized Outcome: Completed/Met   Problem: Respiratory: Goal: Will regain and/or maintain adequate ventilation Outcome: Completed/Met Goal: Respiratory status will improve Outcome: Completed/Met   Problem: Skin Integrity: Goal: Demonstrates signs of wound healing without infection Outcome: Completed/Met   Problem: Urinary Elimination: Goal: Will remain free from infection Outcome: Completed/Met Goal: Ability to achieve and maintain adequate urine output Outcome: Completed/Met   Problem: Education: Goal: Knowledge of General Education information will improve Description: Including pain rating scale, medication(s)/side effects and non-pharmacologic comfort measures Outcome: Completed/Met   Problem: Health Behavior/Discharge Planning: Goal: Ability to manage health-related needs will improve Outcome: Completed/Met   Problem: Clinical Measurements: Goal: Ability to maintain clinical measurements within normal limits will improve Outcome: Completed/Met Goal: Will remain free from infection Outcome: Completed/Met Goal: Diagnostic test results will improve Outcome: Completed/Met Goal:  Respiratory complications will improve Outcome: Completed/Met Goal: Cardiovascular complication will be avoided Outcome: Completed/Met   Problem: Activity: Goal: Risk for activity intolerance will decrease Outcome: Completed/Met   Problem: Nutrition: Goal: Adequate nutrition will be maintained Outcome: Completed/Met   Problem: Coping: Goal: Level of anxiety will decrease Outcome: Completed/Met   Problem: Elimination: Goal: Will not experience complications related to bowel motility Outcome: Completed/Met Goal: Will not experience complications related to urinary retention Outcome: Completed/Met   Problem: Pain Managment: Goal: General experience of comfort will improve and/or be controlled Outcome: Completed/Met   Problem: Safety: Goal: Ability to remain free from injury will improve Outcome: Completed/Met   Problem: Skin Integrity: Goal: Risk for impaired skin integrity will decrease Outcome: Completed/Met   Problem: Education: Goal: Knowledge of the prescribed therapeutic regimen will improve Outcome: Completed/Met Goal: Individualized Educational Video(s) Outcome: Completed/Met   Problem: Activity: Goal: Ability to avoid complications of mobility impairment will improve Outcome: Completed/Met Goal: Range of joint motion will improve Outcome: Completed/Met   Problem: Clinical Measurements: Goal: Postoperative complications will be avoided or minimized Outcome: Completed/Met   Problem: Pain Management: Goal: Pain level will decrease with appropriate interventions Outcome: Completed/Met   Problem: Skin Integrity: Goal: Will show signs of wound healing Outcome: Completed/Met   "

## 2024-04-23 ENCOUNTER — Telehealth: Payer: Self-pay | Admitting: Orthopaedic Surgery

## 2024-04-23 NOTE — Telephone Encounter (Signed)
 I received vm from pt to return call. IC, lmvm. I advised that we last spoke on 12/09 and she was going to call me when she had a date for surgery. Upon looking, I see she has surgery 04/14/24. I advised pt that forms completed and will mail to her. There is no fax number.

## 2024-04-24 ENCOUNTER — Encounter: Payer: Self-pay | Admitting: Radiology

## 2024-04-27 ENCOUNTER — Encounter: Admitting: Orthopaedic Surgery

## 2024-04-29 ENCOUNTER — Encounter: Admitting: Orthopaedic Surgery

## 2024-04-30 ENCOUNTER — Encounter: Payer: Self-pay | Admitting: Orthopaedic Surgery

## 2024-04-30 ENCOUNTER — Ambulatory Visit (INDEPENDENT_AMBULATORY_CARE_PROVIDER_SITE_OTHER): Admitting: Orthopaedic Surgery

## 2024-04-30 ENCOUNTER — Other Ambulatory Visit: Payer: Self-pay

## 2024-04-30 DIAGNOSIS — Z96652 Presence of left artificial knee joint: Secondary | ICD-10-CM

## 2024-04-30 NOTE — Progress Notes (Signed)
 The patient is an active 74 year old female who is here today for her first postoperative visit status post a left total knee replacement to treat significant left knee pain and arthritis.  She has had her right knee replaced in the past.  She says that she is doing fine with pain medication and does not need a refill.  She has been compliant with a baby aspirin  twice daily as well.  On exam her extension is almost full and her flexion is to 90 degrees on the left knee.  Her calf is soft.  She can plantarflex and dorsiflex her left foot.  Her incision looks good.  Staples are removed and Steri-Strips applied.  At this point we will work on getting her set up for outpatient therapy.  If she does run low on pain medication and needs them she knows to reach out to us .  We will see her back in a month to see how she is doing from a range of motion and mobility standpoint but no x-rays are needed.

## 2024-05-01 ENCOUNTER — Encounter: Payer: Self-pay | Admitting: *Deleted

## 2024-05-05 NOTE — Therapy (Incomplete)
 " OUTPATIENT PHYSICAL THERAPY LOWER EXTREMITY EVALUATION   Patient Name: Morgan Swanson MRN: [id number] DOB:December 08, 1950, 74 y.o., female Today's Date: 05/05/2024  END OF SESSION:   Past Medical History:  Diagnosis Date   Anxiety    Arthritis    back    Back pain    Cancer (HCC)    breast cancer   Cataract    small immature   Depression    GERD (gastroesophageal reflux disease)    Hyperlipidemia    Hypertension    Spinal stenosis, lumbar region, without neurogenic claudication    Past Surgical History:  Procedure Laterality Date   30-Day Event Monitor  08/2016   15 events noted. Mostly sinus rhythm with occasional bradycardia. Low rate 50. Average rate 70 bpm. Rare PACs but no runs or couplets. No arrhythmia noted.   BREAST LUMPECTOMY Left    CHOLECYSTECTOMY     HAND SURGERY Right    fatty tissue removed    TOTAL KNEE ARTHROPLASTY Right 05/07/2023   Procedure: RIGHT TOTAL KNEE ARTHROPLASTY;  Surgeon: Vernetta Lonni GRADE, MD;  Location: [facility] OR;  Service: Orthopedics;  Laterality: Right;   TOTAL KNEE ARTHROPLASTY Left 04/14/2024   Procedure: ARTHROPLASTY, KNEE, TOTAL;  Surgeon: Vernetta Lonni GRADE, MD;  Location: [facility] OR;  Service: Orthopedics;  Laterality: Left;   TRANSTHORACIC ECHOCARDIOGRAM  12/2001; 09/2016   a.Normal LV size and function. EF 55-65%. Normal regional wall motion. No valvular lesions;; b. October 04, 2016: Normal LV size with mild LV hypertrophy. EF 55-60%. Mildly increased RV size with normal systolic function. No significant valvular abnormalities   VAGINAL HYSTERECTOMY  1983   Zio Patch Monitor  08/2021   Mostly sinus rhythm, occasional (3.3%) isolated PACs.  Multiple (157) runs of PAT: Fastest- 5&6 beats, max 184 bpm; longest 16 beats (7.7 Sec) avg rate 128 bpm; symptoms mostly noted with sinus rhythm and PACs.  137 triggers most were not SVT/PAT episodes.   Patient Active Problem List   Diagnosis Date Noted   Status post total left knee replacement  04/14/2024   Malignant neoplasm of upper-outer quadrant of left breast in female, estrogen receptor positive (HCC) 11/18/2023   Status post total right knee replacement 05/07/2023   Lumbar radiculopathy 03/15/2021   Chronic back pain 07/09/2017   Essential hypertension 09/19/2016   Heart palpitations 09/19/2016   Systolic murmur 09/19/2016   Chronic bilateral low back pain with sciatica 09/10/2016   Lumbosacral root lesions 01/25/2014   Spinal stenosis, lumbar region, without neurogenic claudication 07/21/2012   Encounter for long-term (current) use of other medications 07/21/2012   Lumbosacral root lesions, not elsewhere classified 07/21/2012    PCP: Earvin Johnston PARAS, FNP   REFERRING PROVIDER: Vernetta Lonni GRADE, MD   REFERRING DIAG: [phone] (ICD-10-CM) - Status post total left knee replacement   THERAPY DIAG:  No diagnosis found.  Rationale for Evaluation and Treatment: Rehabilitation  ONSET DATE: 04/14/24 L TKA  SUBJECTIVE:   SUBJECTIVE STATEMENT: ***  PERTINENT HISTORY: ***  PAIN:  Are you having pain? Yes: NPRS scale: *** Pain location: *** Pain description: *** Aggravating factors: *** Relieving factors: ***  PRECAUTIONS: {Therapy precautions:24002}  RED FLAGS: {PT Red Flags:29287}   WEIGHT BEARING RESTRICTIONS: {Yes ***/No:24003}  FALLS:  Has patient fallen in last 6 months? {fallsyesno:27318}  LIVING ENVIRONMENT: Lives with: {[facility] lives with:25569::lives with their family} Lives in: {Lives in:25570} Stairs: {opstairs:27293} Has following equipment at home: {Assistive devices:23999}  OCCUPATION: ***  PLOF: {PLOF:24004}  PATIENT GOALS: ***  NEXT MD VISIT: ***  OBJECTIVE:  Note: Objective measures were completed at Evaluation unless otherwise noted.  DIAGNOSTIC FINDINGS: IMPRESSION: Status post total left knee arthroplasty. No immediate complication.  PATIENT SURVEYS:  {rehab surveys:24030}  COGNITION: Overall cognitive status:  {cognition:24006}     SENSATION: {sensation:27233}  EDEMA:  {edema:24020}  MUSCLE LENGTH: Hamstrings: Right *** deg; Left *** deg Debby test: Right *** deg; Left *** deg  POSTURE: {posture:25561}  PALPATION: ***  LOWER EXTREMITY ROM:  {AROM/PROM:27142} ROM Right eval Left eval  Hip flexion    Hip extension    Hip abduction    Hip adduction    Hip internal rotation    Hip external rotation    Knee flexion    Knee extension    Ankle dorsiflexion    Ankle plantarflexion    Ankle inversion    Ankle eversion     (Blank rows = not tested)  LOWER EXTREMITY MMT:  MMT Right eval Left eval  Hip flexion    Hip extension    Hip abduction    Hip adduction    Hip internal rotation    Hip external rotation    Knee flexion    Knee extension    Ankle dorsiflexion    Ankle plantarflexion    Ankle inversion    Ankle eversion     (Blank rows = not tested)  LOWER EXTREMITY SPECIAL TESTS:  {LEspecialtests:26242}  FUNCTIONAL TESTS:  {Functional tests:24029}  GAIT: Distance walked: *** Assistive device utilized: {Assistive devices:23999} Level of assistance: {Levels of assistance:24026} Comments: ***                                                                                                                                TREATMENT DATE:  [facility] Adult PT Treatment:                                                DATE: 05/06/24 Therapeutic Exercise: *** Manual Therapy: *** Neuromuscular re-ed: *** Therapeutic Activity: *** Modalities: *** Self Care: ***     PATIENT EDUCATION:  Education details: *** Person educated: {Person educated:25204} Education method: {Education Method:25205} Education comprehension: {Education Comprehension:25206}  HOME EXERCISE PROGRAM: ***  ASSESSMENT:  CLINICAL IMPRESSION: Patient is a 74 y.o. female who was seen today for physical therapy evaluation and treatment for Z96.652 (ICD-10-CM) - Status post total left knee  replacement .   OBJECTIVE IMPAIRMENTS: {opptimpairments:25111}.   ACTIVITY LIMITATIONS: {activitylimitations:27494}  PARTICIPATION LIMITATIONS: {participationrestrictions:25113}  PERSONAL FACTORS: {Personal factors:25162} are also affecting patient's functional outcome.   REHAB POTENTIAL: {rehabpotential:25112}  CLINICAL DECISION MAKING: {clinical decision making:25114}  EVALUATION COMPLEXITY: {Evaluation complexity:25115}   GOALS:  SHORT TERM GOALS: Target date: *** *** Baseline: Goal status: INITIAL  2.  *** Baseline:  Goal status: INITIAL  3.  *** Baseline:  Goal status: INITIAL  4.  ***  Baseline:  Goal status: INITIAL  5.  *** Baseline:  Goal status: INITIAL  6.  *** Baseline:  Goal status: INITIAL  LONG TERM GOALS: Target date: ***  *** Baseline:  Goal status: INITIAL  2.  *** Baseline:  Goal status: INITIAL  3.  *** Baseline:  Goal status: INITIAL  4.  *** Baseline:  Goal status: INITIAL  5.  *** Baseline:  Goal status: INITIAL  6.  *** Baseline:  Goal status: INITIAL   PLAN:  PT FREQUENCY: {rehab frequency:25116}  PT DURATION: {rehab duration:25117}  PLANNED INTERVENTIONS: {rehab planned interventions:25118::97110-Therapeutic exercises,97530- Therapeutic [phone]- Neuromuscular re-education,97535- Self Rjmz,02859- Manual therapy,Patient/Family education}  PLAN FOR NEXT SESSION: ***   Shanequa Whitenight, PT 05/05/2024, 11:24 AM "

## 2024-05-06 ENCOUNTER — Ambulatory Visit

## 2024-05-08 ENCOUNTER — Telehealth: Payer: Self-pay | Admitting: Orthopaedic Surgery

## 2024-05-08 ENCOUNTER — Other Ambulatory Visit: Payer: Self-pay | Admitting: Orthopaedic Surgery

## 2024-05-08 MED ORDER — OXYCODONE HCL 5 MG PO TABS: 5.0000 mg | ORAL_TABLET | Freq: Four times a day (QID) | ORAL | 0 refills | Status: AC | PRN

## 2024-05-08 NOTE — Telephone Encounter (Signed)
 Pt called wanting to get a refill of Oxycodone . Pharmacy is [facility] on [address]. Call back number is [phone].

## 2024-05-12 ENCOUNTER — Ambulatory Visit

## 2024-06-08 ENCOUNTER — Inpatient Hospital Stay

## 2024-06-08 ENCOUNTER — Inpatient Hospital Stay: Admitting: Adult Health
